# Patient Record
Sex: Female | Born: 1980 | Race: White | Hispanic: No | Marital: Married | Smoking: Former smoker
Health system: Southern US, Community
[De-identification: ages and names within clinical notes are randomized; demographics above are authoritative.]

## PROBLEM LIST (undated history)

## (undated) DIAGNOSIS — I480 Paroxysmal atrial fibrillation: Secondary | ICD-10-CM

## (undated) DIAGNOSIS — N12 Tubulo-interstitial nephritis, not specified as acute or chronic: Secondary | ICD-10-CM

## (undated) HISTORY — PX: EYE SURGERY: SHX253

## (undated) HISTORY — PX: CHOLECYSTECTOMY: SHX55

## (undated) HISTORY — PX: TUBAL LIGATION: SHX77

---

## 2011-03-14 ENCOUNTER — Encounter: Payer: Self-pay | Admitting: *Deleted

## 2011-03-14 ENCOUNTER — Emergency Department (HOSPITAL_COMMUNITY)
Admission: EM | Admit: 2011-03-14 | Discharge: 2011-03-14 | Disposition: A | Discharge status: Home / Self Care | Payer: Self-pay | Attending: Emergency Medicine | Admitting: Emergency Medicine

## 2011-03-14 DIAGNOSIS — Z79899 Other long term (current) drug therapy: Secondary | ICD-10-CM | POA: Insufficient documentation

## 2011-03-14 DIAGNOSIS — Z794 Long term (current) use of insulin: Secondary | ICD-10-CM | POA: Insufficient documentation

## 2011-03-14 DIAGNOSIS — R1013 Epigastric pain: Secondary | ICD-10-CM | POA: Insufficient documentation

## 2011-03-14 DIAGNOSIS — E119 Type 2 diabetes mellitus without complications: Secondary | ICD-10-CM | POA: Insufficient documentation

## 2011-03-14 DIAGNOSIS — R112 Nausea with vomiting, unspecified: Secondary | ICD-10-CM | POA: Insufficient documentation

## 2011-03-14 LAB — CBC
HCT: 43 % (ref 36.0–46.0)
MCV: 87.8 fL (ref 78.0–100.0)
RBC: 4.9 MIL/uL (ref 3.87–5.11)
WBC: 9.8 10*3/uL (ref 4.0–10.5)

## 2011-03-14 LAB — BASIC METABOLIC PANEL
BUN: 9 mg/dL (ref 6–23)
CO2: 23 mEq/L (ref 19–32)
Calcium: 9.1 mg/dL (ref 8.4–10.5)
Glucose, Bld: 217 mg/dL — ABNORMAL HIGH (ref 70–99)
Sodium: 136 mEq/L (ref 135–145)

## 2011-03-14 LAB — URINALYSIS, ROUTINE W REFLEX MICROSCOPIC
Bilirubin Urine: NEGATIVE
Glucose, UA: 500 mg/dL — AB
Hgb urine dipstick: NEGATIVE
Protein, ur: NEGATIVE mg/dL
Specific Gravity, Urine: 1.024 (ref 1.005–1.030)

## 2011-03-14 LAB — URINE MICROSCOPIC-ADD ON

## 2011-03-14 MED ORDER — OMEPRAZOLE 20 MG PO CPDR: 40.0000 mg | DELAYED_RELEASE_CAPSULE | Freq: Every day | ORAL | Status: DC

## 2011-03-14 NOTE — ED Provider Notes (Signed)
History     CSN: [id number]  Arrival date & time 03/14/11  1254   First MD Initiated Contact with Patient 03/14/11 1458      Chief Complaint  Patient presents with  . Abdominal Pain    (Consider location/radiation/quality/duration/timing/severity/associated sxs/prior treatment) Patient is a 30 y.o. female presenting with abdominal pain. The history is provided by the patient.  Abdominal Pain The primary symptoms of the illness include abdominal pain, nausea and vomiting. The primary symptoms of the illness do not include shortness of breath or dysuria.  Symptoms associated with the illness do not include chills, diaphoresis, hematuria or back pain.   the patient is a 30 year old, female, with a history of a cholecystectomy, who presents to the emergency department complaining of epigastric pain and nausea and vomiting.  She says when she has the pain.  It goes to her back.  She denies alcohol use.  She has not had peptic cultures in the past.  She denies cough, or shortness of breath.  She denies urinary tract symptoms.  She had one episode of diarrhea.  This morning, but the diarrhea is very unusual with her pain.  Past Medical History  Diagnosis Date  . Diabetes mellitus     Past Surgical History  Procedure Date  . Cholecystectomy     History reviewed. No pertinent family history.  History  Substance Use Topics  . Smoking status: Passive Smoker  . Smokeless tobacco: Not on file  . Alcohol Use: No    OB History    Grav Para Term Preterm Abortions TAB SAB Ect Mult Living                  Review of Systems  Constitutional: Negative for chills and diaphoresis.  HENT: Negative for congestion and neck pain.   Eyes: Negative for redness.  Respiratory: Negative for cough, chest tightness and shortness of breath.   Cardiovascular: Negative for chest pain.  Gastrointestinal: Positive for nausea, vomiting and abdominal pain.  Genitourinary: Negative for dysuria, hematuria  and flank pain.  Musculoskeletal: Negative for back pain.  Skin: Negative for rash.  Neurological: Negative for light-headedness, numbness and headaches.  Psychiatric/Behavioral: Negative for confusion.    Allergies  Review of patient's allergies indicates no known allergies.  Home Medications   Current Outpatient Rx  Name Route Sig Dispense Refill  . INSULIN ISOPHANE HUMAN 100 UNIT/ML [place] SUSP Subcutaneous Inject 20-23 Units into the skin 2 (two) times daily. Per patients sliding scale     . INSULIN REGULAR HUMAN 100 UNIT/ML IJ SOLN Subcutaneous Inject 20-23 Units into the skin 2 (two) times daily before a meal. Per patients sliding scale     . OMEPRAZOLE 20 MG PO CPDR Oral Take 2 capsules (40 mg total) by mouth daily. 30 capsule 6    BP 113/72  Pulse 95  Temp(Src) 98.2 F (36.8 C) (Oral)  Resp 20  SpO2 98%  Physical Exam  Constitutional: She is oriented to person, place, and time. She appears well-developed and well-nourished.  HENT:  Head: Normocephalic and atraumatic.  Eyes: Pupils are equal, round, and reactive to light.  Neck: Normal range of motion.  Cardiovascular: Normal rate, regular rhythm and normal heart sounds.   No murmur heard. Pulmonary/Chest: Effort normal and breath sounds normal. No respiratory distress. She has no wheezes. She has no rales.  Abdominal: Soft. She exhibits no distension and no mass. There is tenderness. There is no rebound and no guarding.  Mild epigastric tenderness, with no peritoneal signs  Musculoskeletal: Normal range of motion. She exhibits no edema and no tenderness.  Neurological: She is alert and oriented to person, place, and time. No cranial nerve deficit.  Skin: Skin is warm and dry. No rash noted. No erythema.  Psychiatric: She has a normal mood and affect. Her behavior is normal.    ED Course  Procedures (including critical care time) Abdominal pain in a 30 year old, female, with a history of cholecystectomy.  She does  not drink alcohol.  She has never had pancreatitis in the past.  She has never had ulcers in the past.  She does not want any pain medications at this time.  Labs Reviewed  BASIC METABOLIC PANEL - Abnormal; Notable for the following:    Potassium 3.4 (*)    Glucose, Bld 217 (*)    All other components within normal limits  URINALYSIS, ROUTINE W REFLEX MICROSCOPIC - Abnormal; Notable for the following:    APPearance CLOUDY (*)    Glucose, UA 500 (*)    Leukocytes, UA MODERATE (*)    All other components within normal limits  CBC - Abnormal; Notable for the following:    Hemoglobin 15.4 (*)    All other components within normal limits  LIPASE, BLOOD  URINE MICROSCOPIC-ADD ON   No results found.   1. Abdominal pain       MDM  Epigastric abdominal pain in a 30 year old, female, with a history of cholecystectomy.  No evidence of pancreatitis.  No acute abdomen.  I suspect, that she's got gastritis or peptic ulcers.  I will prescribe Prilosec and refer her to a gastroenterologist, for further evaluation.  She does not have an acute abdomen, and she does not want pain medications at this time.        Nicholes Stairs, MD 03/14/11 [phone]

## 2011-03-14 NOTE — ED Notes (Signed)
Pt reports several month history of mid upper abdominal pain. Pt reports pain is intermittent and comes in waves. Pt reports starting last night and n/v/d. Pt has had cholectomy.

## 2012-08-10 ENCOUNTER — Emergency Department (HOSPITAL_COMMUNITY)
Admission: EM | Admit: 2012-08-10 | Discharge: 2012-08-10 | Disposition: A | Discharge status: Home / Self Care | Payer: Self-pay | Attending: Emergency Medicine | Admitting: Emergency Medicine

## 2012-08-10 ENCOUNTER — Encounter (HOSPITAL_COMMUNITY): Payer: Self-pay | Admitting: Adult Health

## 2012-08-10 DIAGNOSIS — Z794 Long term (current) use of insulin: Secondary | ICD-10-CM | POA: Insufficient documentation

## 2012-08-10 DIAGNOSIS — E119 Type 2 diabetes mellitus without complications: Secondary | ICD-10-CM | POA: Insufficient documentation

## 2012-08-10 DIAGNOSIS — K0889 Other specified disorders of teeth and supporting structures: Secondary | ICD-10-CM

## 2012-08-10 DIAGNOSIS — K089 Disorder of teeth and supporting structures, unspecified: Secondary | ICD-10-CM | POA: Insufficient documentation

## 2012-08-10 MED ORDER — AMOXICILLIN 500 MG PO CAPS: 500.0000 mg | ORAL_CAPSULE | Freq: Three times a day (TID) | ORAL | Status: DC

## 2012-08-10 MED ORDER — HYDROCODONE-ACETAMINOPHEN 5-325 MG PO TABS: 1.0000 | ORAL_TABLET | ORAL | Status: DC | PRN

## 2012-08-10 MED ORDER — ONDANSETRON 4 MG PO TBDP: 4.0000 mg | ORAL_TABLET | Freq: Three times a day (TID) | ORAL | Status: DC | PRN

## 2012-08-10 NOTE — ED Provider Notes (Signed)
History     CSN: [id number]  Arrival date & time 08/10/12  1509   First MD Initiated Contact with Patient 08/10/12 1517      Chief Complaint  Patient presents with  . Dental Pain    (Consider location/radiation/quality/duration/timing/severity/associated sxs/prior treatment) HPI   Patient presents to the emergency department with a dental complaint. Symptoms on the right began a few days ago and the left began today. The patient has tried to alleviate pain with ibuprofen.  Pain rated at a 10/10, characterized as throbbing in nature and located  began right and upper molars. Patient denies fever, night sweats, chills, difficulty swallowing or opening mouth, SOB, nuchal rigidity or decreased ROM of neck. She has a dentist appointment scheduled for this up coming Monday  Patient does not have a dentist and requests a resource guide at discharge.  Past Medical History  Diagnosis Date  . Diabetes mellitus     Past Surgical History  Procedure Laterality Date  . Cholecystectomy      History reviewed. No pertinent family history.  History  Substance Use Topics  . Smoking status: Passive Smoke Exposure - Never Smoker  . Smokeless tobacco: Not on file  . Alcohol Use: No    OB History   Grav Para Term Preterm Abortions TAB SAB Ect Mult Living                  Review of Systems  HENT: Positive for dental problem.   All other systems reviewed and are negative.    Allergies  Review of patient's allergies indicates no known allergies.  Home Medications   Current Outpatient Rx  Name  Route  Sig  Dispense  Refill  . Cyanocobalamin (VITAMIN B-12 PO)   Oral   Take 1 tablet by mouth daily.         . insulin NPH (HUMULIN N,NOVOLIN N) 100 UNIT/ML injection   Subcutaneous   Inject 20-23 Units into the skin 2 (two) times daily. Per patients sliding scale          . insulin regular (NOVOLIN R,HUMULIN R) 100 units/mL injection   Subcutaneous   Inject 20-23 Units into the  skin 2 (two) times daily before a meal. Per patients sliding scale          . Multiple Vitamin (MULTIVITAMIN WITH MINERALS) TABS   Oral   Take 1 tablet by mouth daily.         Marland Kitchen amoxicillin (AMOXIL) 500 MG capsule   Oral   Take 1 capsule (500 mg total) by mouth 3 (three) times daily.   21 capsule   0   . HYDROcodone-acetaminophen (NORCO/VICODIN) 5-325 MG per tablet   Oral   Take 1 tablet by mouth every 4 (four) hours as needed for pain.   15 tablet   0   . ondansetron (ZOFRAN ODT) 4 MG disintegrating tablet   Oral   Take 1 tablet (4 mg total) by mouth every 8 (eight) hours as needed for nausea.   20 tablet   0     BP 124/82  Pulse 98  Temp(Src) 98.6 F (37 C) (Oral)  Resp 20  SpO2 100%  Physical Exam  Constitutional: She appears well-developed and well-nourished. No distress.  HENT:  Head: Normocephalic and atraumatic.  Mouth/Throat: Uvula is midline, oropharynx is clear and moist and mucous membranes are normal. Normal dentition. Dental caries (Pts tooth shows no obvious abscess but moderate to severe tenderness to palpation of marked tooth)  present. No edematous.    Eyes: Pupils are equal, round, and reactive to light.  Neck: Trachea normal, normal range of motion and full passive range of motion without pain. Neck supple.  Cardiovascular: Normal rate, regular rhythm, normal heart sounds and normal pulses.   Pulmonary/Chest: Effort normal and breath sounds normal. No respiratory distress. Chest wall is not dull to percussion. She exhibits no tenderness, no crepitus, no edema, no deformity and no retraction.  Abdominal: Normal appearance.  Musculoskeletal: Normal range of motion.  Neurological: She is alert. She has normal strength.  Skin: Skin is warm, dry and intact. She is not diaphoretic.  Psychiatric: She has a normal mood and affect. Her speech is normal. Cognition and memory are normal.    ED Course  Procedures (including critical care time)  Labs  Reviewed - No data to display No results found.   1. Toothache       MDM  Patient has dental pain. No emergent s/sx's present. Patent airway. No trismus.  Will be given pain medication and antibiotics. I discussed the need to call dentist within 24/48 hours for follow-up. Dental referral given. Return to ED precautions given.  Pt voiced understanding and has agreed to follow-up.          Dorthula Matas, PA-C 08/10/12 1533

## 2012-08-10 NOTE — ED Notes (Signed)
Presents with left lower broken tooth and pain that began this am, c/o pain. Has a dentist appointment this week. Pain is unbearable.

## 2012-08-10 NOTE — ED Provider Notes (Signed)
Medical screening examination/treatment/procedure(s) were performed by non-physician practitioner and as supervising physician I was immediately available for consultation/collaboration.   Gwyneth Sprout, MD 08/10/12 1544

## 2013-01-16 ENCOUNTER — Encounter (HOSPITAL_COMMUNITY): Payer: Self-pay | Admitting: Emergency Medicine

## 2013-01-16 ENCOUNTER — Emergency Department (INDEPENDENT_AMBULATORY_CARE_PROVIDER_SITE_OTHER)
Admission: EM | Admit: 2013-01-16 | Discharge: 2013-01-16 | Disposition: A | Discharge status: Home / Self Care | Payer: Self-pay | Source: Home / Self Care | Attending: Emergency Medicine | Admitting: Emergency Medicine

## 2013-01-16 DIAGNOSIS — K0401 Reversible pulpitis: Secondary | ICD-10-CM

## 2013-01-16 MED ORDER — IBUPROFEN 800 MG PO TABS
ORAL_TABLET | ORAL | Status: AC
  Filled 2013-01-16: qty 1

## 2013-01-16 MED ORDER — MELOXICAM 15 MG PO TABS: 15.0000 mg | ORAL_TABLET | Freq: Every day | ORAL | Status: DC

## 2013-01-16 MED ORDER — OXYCODONE-ACETAMINOPHEN 5-325 MG PO TABS: ORAL_TABLET | ORAL | Status: DC

## 2013-01-16 MED ORDER — IBUPROFEN 800 MG PO TABS
800.0000 mg | ORAL_TABLET | Freq: Once | ORAL | Status: AC
  Administered 2013-01-16: 800 mg via ORAL

## 2013-01-16 MED ORDER — AMOXICILLIN 500 MG PO CAPS: 500.0000 mg | ORAL_CAPSULE | Freq: Three times a day (TID) | ORAL | Status: DC

## 2013-01-16 NOTE — ED Provider Notes (Signed)
Chief Complaint:   Chief Complaint  Patient presents with  . Dental Pain    History of Present Illness:   Katie Hampton is a 32 year old female who has had a one-week history of pain involving the left, lower first molar. She cracked this up she wants to go, it didn't bother her until about a week. Is been swelling the gingiva and purulent drainage. She denies any difficulty opening her mouth, swelling of the tongue, difficulty breathing or swallowing. Has been no fever or, she has had some headache. She denies any swelling of the neck or adenopathy. No chest pain or shortness of breath.  Review of Systems:  Other than noted above, the patient denies any of the following symptoms: Systemic:  No fever, chills,  Or sweats. ENT:  No headache, ear ache, sore throat, nasal congestion, facial pain, or swelling. Lymphatic:  No adenopathy. Lungs:  No coughing, wheezing or shortness of breath.  PMFSH:  Past medical history, family history, social history, meds, and allergies were reviewed. She has insulin requiring diabetes.  Physical Exam:   Vital signs:  BP 146/83  Pulse 92  Temp(Src) 98.8 F (37.1 C) (Oral)  Resp 16  SpO2 99%  LMP 12/26/2012 General:  Alert, oriented, in no distress. ENT:  TMs and canals normal.  Nasal mucosa normal. Mouth exam:  The right, lower first molar was cracked and dictated. There is no swelling of the gingiva or collection of pus. She is able to fully open her mouth. The pharynx is clear, no swelling of the tongue the floor the mouth. Otherwise teeth are in good repair. Neck:  No swelling or adenopathy. Lungs:  Breath sounds clear and equal bilaterally.  No wheezes, rales or rhonchi. Heart:  Regular rhythm.  No gallops or murmers. Skin:  Clear, warm and dry.   Course in Urgent Care Center:   Benzocaine gel was applied. Patient declines a dental block.  Assessment:  The encounter diagnosis was Pulpitis.  Plan:   1.  Meds:  The following meds were  prescribed:   Discharge Medication List as of 01/16/2013  5:27 PM    START taking these medications   Details  !! amoxicillin (AMOXIL) 500 MG capsule Take 1 capsule (500 mg total) by mouth 3 (three) times daily., Starting 01/16/2013, Until Discontinued, Normal    meloxicam (MOBIC) 15 MG tablet Take 1 tablet (15 mg total) by mouth daily., Starting 01/16/2013, Until Discontinued, Normal    oxyCODONE-acetaminophen (PERCOCET) 5-325 MG per tablet 1 to 2 tablets every 6 hours as needed for pain., Print     !! - Potential duplicate medications found. Please discuss with provider.      2.  Patient Education/Counseling:  The patient was given appropriate handouts, self care instructions, and instructed in symptomatic relief. Suggested sleeping with head of bed elevated and hot salt water mouthwash.   3.  Follow up:  The patient was told to follow up if no better in 3 to 4 days, if becoming worse in any way, and given some red flag symptoms such as difficulty swallowing or breathing which would prompt immediate return.  Follow up with a dentist as soon as posssible.     Reuben Likes, MD 01/16/13 [phone]

## 2013-01-16 NOTE — ED Notes (Signed)
Pt  Reports  Symptoms  Of        Toothache  She  Reports       Poss  Broken  Tooth              Symptoms  X  1  Week  Ago              Symptoms  Not  releived  By Armond Hang    /  Tylenol

## 2013-12-11 ENCOUNTER — Encounter (HOSPITAL_COMMUNITY): Payer: Self-pay | Admitting: Emergency Medicine

## 2013-12-11 ENCOUNTER — Emergency Department (HOSPITAL_COMMUNITY)
Admission: EM | Admit: 2013-12-11 | Discharge: 2013-12-11 | Disposition: A | Discharge status: Home / Self Care | Payer: No Typology Code available for payment source | Attending: Emergency Medicine | Admitting: Emergency Medicine

## 2013-12-11 DIAGNOSIS — R519 Headache, unspecified: Secondary | ICD-10-CM

## 2013-12-11 DIAGNOSIS — E119 Type 2 diabetes mellitus without complications: Secondary | ICD-10-CM | POA: Diagnosis not present

## 2013-12-11 DIAGNOSIS — Z9889 Other specified postprocedural states: Secondary | ICD-10-CM | POA: Diagnosis not present

## 2013-12-11 DIAGNOSIS — Z792 Long term (current) use of antibiotics: Secondary | ICD-10-CM | POA: Insufficient documentation

## 2013-12-11 DIAGNOSIS — Z79899 Other long term (current) drug therapy: Secondary | ICD-10-CM | POA: Diagnosis not present

## 2013-12-11 DIAGNOSIS — R51 Headache: Secondary | ICD-10-CM | POA: Insufficient documentation

## 2013-12-11 DIAGNOSIS — Z794 Long term (current) use of insulin: Secondary | ICD-10-CM | POA: Diagnosis not present

## 2013-12-11 DIAGNOSIS — Z791 Long term (current) use of non-steroidal anti-inflammatories (NSAID): Secondary | ICD-10-CM | POA: Diagnosis not present

## 2013-12-11 MED ORDER — OXYCODONE-ACETAMINOPHEN 5-325 MG PO TABS
2.0000 | ORAL_TABLET | Freq: Once | ORAL | Status: AC
  Administered 2013-12-11: 2 via ORAL
  Filled 2013-12-11: qty 2

## 2013-12-11 MED ORDER — TETRACAINE HCL 0.5 % OP SOLN
1.0000 [drp] | Freq: Once | OPHTHALMIC | Status: AC
  Administered 2013-12-11: 2 [drp] via OPHTHALMIC
  Filled 2013-12-11: qty 2

## 2013-12-11 MED ORDER — ONDANSETRON 4 MG PO TBDP
8.0000 mg | ORAL_TABLET | Freq: Once | ORAL | Status: AC
  Administered 2013-12-11: 8 mg via ORAL
  Filled 2013-12-11: qty 2

## 2013-12-11 NOTE — ED Notes (Signed)
Pt. Stated, i've had a headache for 3 days.

## 2013-12-11 NOTE — ED Provider Notes (Signed)
CSN: [id number]     Arrival date & time 12/11/13  1132 History  This chart was scribed for non-physician practitioner working with Rolland Porter, MD by Elveria Rising, ED Scribe. This patient was seen in room TR06C/TR06C and the patient's care was started at 12:53 PM.   Chief Complaint  Patient presents with  . Headache   The history is provided by the patient. No language interpreter was used.   HPI Comments: Katie Hampton is a 33 y.o. female who presents to the Emergency Department complaining of intermittent, headache ongoing for three days. It has been gradually worsening. 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 Allyne Gee: procedure described as "gas bubble". Patient reports nausea and episode of vomiting days following her surgery which seemed to cause straining pressure in her left eye, severe left sided headache, and occasional sensation of foreign body in the left eye at the incision site.   Patient reports calling her surgeon the following day for episode of vomiting. She was advised to treat with cool compresses. Patient reports attempted treatment with Aleve and cool compresses, but denies relief. Patient has been using her prescribed drops as directed.  Patient denies new visual changes or disturbance, discharge, or increased redness.    Past Medical History  Diagnosis Date  . Diabetes mellitus    Past Surgical History  Procedure Laterality Date  . Cholecystectomy     No family history on file. History  Substance Use Topics  . Smoking status: Passive Smoke Exposure - Never Smoker  . Smokeless tobacco: Not on file  . Alcohol Use: No   OB History   Grav Para Term Preterm Abortions TAB SAB Ect Mult Living                 Review of Systems  Constitutional: Negative for fever and chills.  Eyes: Positive for pain. Negative for discharge, redness and visual disturbance.  Neurological: Positive for headaches.  All other systems  reviewed and are negative.  Allergies  Review of patient's allergies indicates no known allergies.  Home Medications   Prior to Admission medications   Medication Sig Start Date End Date Taking? Authorizing Provider  amoxicillin (AMOXIL) 500 MG capsule Take 1 capsule (500 mg total) by mouth 3 (three) times daily. 08/10/12   Tiffany Irine Seal, PA-C  amoxicillin (AMOXIL) 500 MG capsule Take 1 capsule (500 mg total) by mouth 3 (three) times daily. 01/16/13   Reuben Likes, MD  Cyanocobalamin (VITAMIN B-12 PO) Take 1 tablet by mouth daily.    Historical Provider, MD  HYDROcodone-acetaminophen (NORCO/VICODIN) 5-325 MG per tablet Take 1 tablet by mouth every 4 (four) hours as needed for pain. 08/10/12   Tiffany Irine Seal, PA-C  insulin NPH (HUMULIN N,NOVOLIN N) 100 UNIT/ML injection Inject 20-23 Units into the skin 2 (two) times daily. Per patients sliding scale     Historical Provider, MD  insulin regular (NOVOLIN R,HUMULIN R) 100 units/mL injection Inject 20-23 Units into the skin 2 (two) times daily before a meal. Per patients sliding scale     Historical Provider, MD  meloxicam (MOBIC) 15 MG tablet Take 1 tablet (15 mg total) by mouth daily. 01/16/13   Reuben Likes, MD  Multiple Vitamin (MULTIVITAMIN WITH MINERALS) TABS Take 1 tablet by mouth daily.    Historical Provider, MD  ondansetron (ZOFRAN ODT) 4 MG disintegrating tablet Take 1 tablet (4 mg total) by mouth every 8 (eight) hours as needed for nausea.  08/10/12   Tiffany Irine Seal, PA-C  oxyCODONE-acetaminophen (PERCOCET) 5-325 MG per tablet 1 to 2 tablets every 6 hours as needed for pain. 01/16/13   Reuben Likes, MD   Triage Vitals: BP 145/74  Pulse 102  Temp(Src) 98.4 F (36.9 C) (Oral)  Resp 18  Ht  (1.6 m)  Wt 214 lb (97.07 kg)  BMI 37.92 kg/m2  SpO2 98%  LMP 11/17/2013  Physical Exam  Nursing note and vitals reviewed. Constitutional: She is oriented to person, place, and time. She appears well-developed and well-nourished. No  distress.  HENT:  Head: Normocephalic and atraumatic.  Right Ear: External ear normal.  Left Ear: External ear normal.  Nose: Nose normal.  Mouth/Throat: Oropharynx is clear and moist.  Eyes: EOM and lids are normal. Right eye exhibits no discharge. Left eye exhibits no discharge. Right conjunctiva is not injected. Right conjunctiva has no hemorrhage. Left conjunctiva is not injected. Left conjunctiva has no hemorrhage. Right pupil is round and reactive. Left pupil is not reactive (unchanged post op). Left pupil is round. Pupils are unequal.  Fundoscopic exam:      The right eye shows no papilledema. The right eye shows no red reflex.  Anterior chamber of left eye clear. Funduscopic examination reveals cloudy intraocular findings.   Neck: Normal range of motion. Neck supple.  Cardiovascular: Normal rate, regular rhythm and normal heart sounds.   Pulmonary/Chest: Effort normal and breath sounds normal.  Abdominal: Soft.  Musculoskeletal: Normal range of motion.  Neurological: She is alert and oriented to person, place, and time.  Skin: Skin is warm and dry. She is not diaphoretic.  Psychiatric: She has a normal mood and affect.    ED Course  Procedures (including critical care time) Medications  tetracaine (PONTOCAINE) 0.5 % ophthalmic solution 1-2 drop (2 drops Left Eye Given 12/11/13 1350)  oxyCODONE-acetaminophen (PERCOCET/ROXICET) 5-325 MG per tablet 2 tablet (2 tablets Oral Given 12/11/13 1348)  ondansetron (ZOFRAN-ODT) disintegrating tablet 8 mg (8 mg Oral Given 12/11/13 1349)    COORDINATION OF CARE: 12:51 PM- Discussed treatment plan with patient at bedside and patient agreed to plan.   Labs Review Labs Reviewed - No data to display  Imaging Review No results found.   EKG Interpretation None      Discussed patient with Dr. Allyne Gee, her surgeon, who recommends checking eye pressure. He states the eye pressure should be less than . He also states the patient was  supposed to follow up in his office on Friday, but never showed up.   Called Dr. Allyne Gee back after tonopen examination. Told him two pressures were obtained at 19 and . He states discharge the patient home with follow up in his office Monday, he thinks the headache is unlikely a serious complication from the eye surgery.  MDM   Final diagnoses:  None    Filed Vitals:   12/11/13 1436  BP: 134/78  Pulse: 76  Temp: 98.4 F (36.9 C)  Resp: 16   Afebrile, NAD, non-toxic appearing, AAOx4. Headache likely related to post op changes, due to increased sensitivity per Dr. Allyne Gee. He does not believe that this is an acute ophthalmologic emergency. He states symptomatic treatment with follow up in his office on Monday. He also suggested increasing Pred Forte drops to 6 times daily. Also recommended that patient does not use antibiotic drops with Pred forte drops simultaneously. Return precautions were discussed. Patient is agreeable to plan. Patient is able to discharge   Patient d/w with  Dr. Fayrene Fearing, agrees with plan.    I personally performed the services described in this documentation, which was scribed in my presence. The recorded information has been reviewed and is accurate.    Jeannetta Ellis, PA-C 12/11/13 1545

## 2013-12-11 NOTE — ED Notes (Signed)
Pt. Stated, I had eye surgery on the Sept. 17.  And now I've had a headache for 3 days.

## 2013-12-11 NOTE — Discharge Instructions (Signed)
Please follow up with Dr. Allyne Gee on Monday to be seen. Please increase the use of your Pred Forte to six times daily. Please space the use of your Pred Forte drops and antibiotic drops. Please place your rewetting drops in the fridge to help provide more relief. Please use cool compresses to the eye to help with symptoms. Please read all discharge instructions and return precautions.   General Headache Without Cause A headache is pain or discomfort felt around the head or neck area. The specific cause of a headache may not be found. There are many causes and types of headaches. A few common ones are:  Tension headaches.  Migraine headaches.  Cluster headaches.  Chronic daily headaches. HOME CARE INSTRUCTIONS   Keep all follow-up appointments with your caregiver or any specialist referral.  Only take over-the-counter or prescription medicines for pain or discomfort as directed by your caregiver.  Lie down in a dark, quiet room when you have a headache.  Keep a headache journal to find out what may trigger your migraine headaches. For example, write down:  What you eat and drink.  How much sleep you get.  Any change to your diet or medicines.  Try massage or other relaxation techniques.  Put ice packs or heat on the head and neck. Use these 3 to 4 times per day for 15 to 20 minutes each time, or as needed.  Limit stress.  Sit up straight, and do not tense your muscles.  Quit smoking if you smoke.  Limit alcohol use.  Decrease the amount of caffeine you drink, or stop drinking caffeine.  Eat and sleep on a regular schedule.  Get 7 to 9 hours of sleep, or as recommended by your caregiver.  Keep lights dim if bright lights bother you and make your headaches worse. SEEK MEDICAL CARE IF:   You have problems with the medicines you were prescribed.  Your medicines are not working.  You have a change from the usual headache.  You have nausea or vomiting. SEEK IMMEDIATE  MEDICAL CARE IF:   Your headache becomes severe.  You have a fever.  You have a stiff neck.  You have loss of vision.  You have muscular weakness or loss of muscle control.  You start losing your balance or have trouble walking.  You feel faint or pass out.  You have severe symptoms that are different from your first symptoms. MAKE SURE YOU:   Understand these instructions.  Will watch your condition.  Will get help right away if you are not doing well or get worse. Document Released: 03/04/2005 Document Revised: 05/27/2011 Document Reviewed: 03/20/2011 [facility] Patient Information 2015 [place], [place]. This information is not intended to replace advice given to you by your health care provider. Make sure you discuss any questions you have with your health care provider.

## 2013-12-12 NOTE — ED Provider Notes (Signed)
Medical screening examination/treatment/procedure(s) were performed by non-physician practitioner and as supervising physician I was immediately available for consultation/collaboration.   EKG Interpretation None        Rolland Porter, MD 12/12/13 1540

## 2014-09-02 ENCOUNTER — Emergency Department (HOSPITAL_COMMUNITY)
Admission: EM | Admit: 2014-09-02 | Discharge: 2014-09-02 | Disposition: A | Discharge status: Home / Self Care | Payer: No Typology Code available for payment source | Source: Home / Self Care | Attending: Family Medicine | Admitting: Family Medicine

## 2014-09-02 ENCOUNTER — Encounter (HOSPITAL_COMMUNITY): Payer: Self-pay | Admitting: *Deleted

## 2014-09-02 DIAGNOSIS — N390 Urinary tract infection, site not specified: Secondary | ICD-10-CM

## 2014-09-02 LAB — POCT URINALYSIS DIP (DEVICE)
BILIRUBIN URINE: NEGATIVE
GLUCOSE, UA: 100 mg/dL — AB
Hgb urine dipstick: NEGATIVE
Ketones, ur: NEGATIVE mg/dL
LEUKOCYTES UA: NEGATIVE
NITRITE: POSITIVE — AB
Protein, ur: NEGATIVE mg/dL
Specific Gravity, Urine: 1.01 (ref 1.005–1.030)
Urobilinogen, UA: 1 mg/dL (ref 0.0–1.0)
pH: 5.5 (ref 5.0–8.0)

## 2014-09-02 LAB — POCT PREGNANCY, URINE: PREG TEST UR: NEGATIVE

## 2014-09-02 MED ORDER — FLUCONAZOLE 150 MG PO TABS: 150.0000 mg | ORAL_TABLET | Freq: Once | ORAL | Status: DC

## 2014-09-02 MED ORDER — TERCONAZOLE 80 MG VA SUPP: 80.0000 mg | Freq: Every day | VAGINAL | Status: DC

## 2014-09-02 MED ORDER — CEPHALEXIN 500 MG PO CAPS: 500.0000 mg | ORAL_CAPSULE | Freq: Four times a day (QID) | ORAL | Status: DC

## 2014-09-02 NOTE — ED Notes (Signed)
Pt  Has  Had  uti  In  Past      -   She  Reports  Symptoms  Of    Burning   And  Frequency  On  Urination          She  Is  A  Diabetic

## 2014-09-02 NOTE — ED Provider Notes (Signed)
CSN: [id number]     Arrival date & time 09/02/14  1437 History   First MD Initiated Contact with Patient 09/02/14 1609     Chief Complaint  Patient presents with  . Urinary Tract Infection   (Consider location/radiation/quality/duration/timing/severity/associated sxs/prior Treatment) Patient is a 34 y.o. female presenting with urinary tract infection. The history is provided by the patient.  Urinary Tract Infection Pain quality:  Burning Pain severity:  Mild Onset quality:  Gradual Duration:  3 days Progression:  Unchanged Chronicity:  New Recent urinary tract infections: no   Urinary symptoms: frequent urination   Urinary symptoms: no foul-smelling urine   Associated symptoms: abdominal pain   Associated symptoms: no fever, no flank pain, no nausea, no vaginal discharge and no vomiting   Risk factors: not pregnant and no recurrent urinary tract infections     Past Medical History  Diagnosis Date  . Diabetes mellitus    Past Surgical History  Procedure Laterality Date  . Cholecystectomy     History reviewed. No pertinent family history. History  Substance Use Topics  . Smoking status: Passive Smoke Exposure - Never Smoker  . Smokeless tobacco: Not on file  . Alcohol Use: No   OB History    No data available     Review of Systems  Constitutional: Negative.  Negative for fever.  Gastrointestinal: Positive for abdominal pain. Negative for nausea and vomiting.  Genitourinary: Positive for dysuria and frequency. Negative for hematuria, flank pain, vaginal discharge, difficulty urinating, menstrual problem and pelvic pain.    Allergies  Review of patient's allergies indicates no known allergies.  Home Medications   Prior to Admission medications   Medication Sig Start Date End Date Taking? Authorizing Provider  amoxicillin (AMOXIL) 500 MG capsule Take 1 capsule (500 mg total) by mouth 3 (three) times daily. 08/10/12   Tiffany Neva Seat, PA-C  amoxicillin (AMOXIL) 500 MG  capsule Take 1 capsule (500 mg total) by mouth 3 (three) times daily. 01/16/13   Reuben Likes, MD  cephALEXin (KEFLEX) 500 MG capsule Take 1 capsule (500 mg total) by mouth 4 (four) times daily. Take all of medicine and drink lots of fluids 09/02/14   Linna Hoff, MD  Cyanocobalamin (VITAMIN B-12 PO) Take 1 tablet by mouth daily.    Historical Provider, MD  fluconazole (DIFLUCAN) 150 MG tablet Take 1 tablet (150 mg total) by mouth once. 09/02/14   Linna Hoff, MD  HYDROcodone-acetaminophen (NORCO/VICODIN) 5-325 MG per tablet Take 1 tablet by mouth every 4 (four) hours as needed for pain. 08/10/12   Tiffany Neva Seat, PA-C  insulin NPH (HUMULIN N,NOVOLIN N) 100 UNIT/ML injection Inject 20-23 Units into the skin 2 (two) times daily. Per patients sliding scale     Historical Provider, MD  insulin regular (NOVOLIN R,HUMULIN R) 100 units/mL injection Inject 20-23 Units into the skin 2 (two) times daily before a meal. Per patients sliding scale     Historical Provider, MD  meloxicam (MOBIC) 15 MG tablet Take 1 tablet (15 mg total) by mouth daily. 01/16/13   Reuben Likes, MD  Multiple Vitamin (MULTIVITAMIN WITH MINERALS) TABS Take 1 tablet by mouth daily.    Historical Provider, MD  ondansetron (ZOFRAN ODT) 4 MG disintegrating tablet Take 1 tablet (4 mg total) by mouth every 8 (eight) hours as needed for nausea. 08/10/12   Marlon Pel, PA-C  oxyCODONE-acetaminophen (PERCOCET) 5-325 MG per tablet 1 to 2 tablets every 6 hours as needed for pain. 01/16/13   Onalee Hua  Vivia Budge, MD  terconazole (TERAZOL 3) 80 MG vaginal suppository Place 1 suppository (80 mg total) vaginally at bedtime. 09/02/14   Linna Hoff, MD   BP 124/78 mmHg  Pulse 78  Temp(Src) 98.6 F (37 C) (Oral)  Resp 18  SpO2 100%  LMP 08/26/2014 Physical Exam  Constitutional: She is oriented to person, place, and time. She appears well-developed and well-nourished. No distress.  Abdominal: Soft. Bowel sounds are normal. She exhibits no mass.  There is no tenderness. There is no rebound and no guarding.  Neurological: She is alert and oriented to person, place, and time.  Skin: Skin is warm and dry.  Nursing note and vitals reviewed.   ED Course  Procedures (including critical care time) Labs Review Labs Reviewed  POCT URINALYSIS DIP (DEVICE) - Abnormal; Notable for the following:    Glucose, UA 100 (*)    Nitrite POSITIVE (*)    All other components within normal limits  POCT PREGNANCY, URINE    Imaging Review No results found.   MDM   1. UTI (lower urinary tract infection)        Linna Hoff, MD 09/02/14 [id number]

## 2014-09-02 NOTE — Discharge Instructions (Signed)
Take all of medicine as directed, drink lots of fluids, see your doctor if further problems. °

## 2015-07-30 ENCOUNTER — Ambulatory Visit (HOSPITAL_COMMUNITY)
Admission: EM | Admit: 2015-07-30 | Discharge: 2015-07-30 | Disposition: A | Discharge status: Home / Self Care | Payer: Medicaid Other | Attending: Emergency Medicine | Admitting: Emergency Medicine

## 2015-07-30 ENCOUNTER — Encounter (HOSPITAL_COMMUNITY): Payer: Self-pay | Admitting: Emergency Medicine

## 2015-07-30 ENCOUNTER — Emergency Department (HOSPITAL_COMMUNITY): Payer: Medicaid Other

## 2015-07-30 ENCOUNTER — Encounter (HOSPITAL_COMMUNITY): Payer: Self-pay | Admitting: *Deleted

## 2015-07-30 ENCOUNTER — Emergency Department (HOSPITAL_COMMUNITY)
Admission: EM | Admit: 2015-07-30 | Discharge: 2015-07-30 | Disposition: A | Discharge status: Home / Self Care | Payer: Medicaid Other | Attending: Emergency Medicine | Admitting: Emergency Medicine

## 2015-07-30 DIAGNOSIS — R300 Dysuria: Secondary | ICD-10-CM | POA: Insufficient documentation

## 2015-07-30 DIAGNOSIS — Z79899 Other long term (current) drug therapy: Secondary | ICD-10-CM | POA: Diagnosis not present

## 2015-07-30 DIAGNOSIS — Z791 Long term (current) use of non-steroidal anti-inflammatories (NSAID): Secondary | ICD-10-CM | POA: Insufficient documentation

## 2015-07-30 DIAGNOSIS — R197 Diarrhea, unspecified: Secondary | ICD-10-CM | POA: Diagnosis not present

## 2015-07-30 DIAGNOSIS — R112 Nausea with vomiting, unspecified: Secondary | ICD-10-CM | POA: Insufficient documentation

## 2015-07-30 DIAGNOSIS — M545 Low back pain: Secondary | ICD-10-CM | POA: Diagnosis not present

## 2015-07-30 DIAGNOSIS — E119 Type 2 diabetes mellitus without complications: Secondary | ICD-10-CM | POA: Insufficient documentation

## 2015-07-30 DIAGNOSIS — R109 Unspecified abdominal pain: Secondary | ICD-10-CM

## 2015-07-30 DIAGNOSIS — R1032 Left lower quadrant pain: Secondary | ICD-10-CM | POA: Diagnosis not present

## 2015-07-30 DIAGNOSIS — Z792 Long term (current) use of antibiotics: Secondary | ICD-10-CM | POA: Diagnosis not present

## 2015-07-30 DIAGNOSIS — Z3202 Encounter for pregnancy test, result negative: Secondary | ICD-10-CM | POA: Insufficient documentation

## 2015-07-30 DIAGNOSIS — F17210 Nicotine dependence, cigarettes, uncomplicated: Secondary | ICD-10-CM | POA: Diagnosis not present

## 2015-07-30 DIAGNOSIS — Z794 Long term (current) use of insulin: Secondary | ICD-10-CM | POA: Insufficient documentation

## 2015-07-30 LAB — COMPREHENSIVE METABOLIC PANEL
ALK PHOS: 86 U/L (ref 38–126)
ALT: 17 U/L (ref 14–54)
AST: 41 U/L (ref 15–41)
Albumin: 3.5 g/dL (ref 3.5–5.0)
Anion gap: 11 (ref 5–15)
BUN: 11 mg/dL (ref 6–20)
CALCIUM: 9 mg/dL (ref 8.9–10.3)
CO2: 24 mmol/L (ref 22–32)
CREATININE: 0.81 mg/dL (ref 0.44–1.00)
Chloride: 101 mmol/L (ref 101–111)
GFR calc non Af Amer: >60 mL/min (ref >60)
Glucose, Bld: 242 mg/dL — ABNORMAL HIGH (ref 65–99)
Potassium: 5.3 mmol/L — ABNORMAL HIGH (ref 3.5–5.1)
SODIUM: 136 mmol/L (ref 135–145)
Total Bilirubin: 0.8 mg/dL (ref 0.3–1.2)
Total Protein: 6.9 g/dL (ref 6.5–8.1)

## 2015-07-30 LAB — POCT URINALYSIS DIP (DEVICE)
BILIRUBIN URINE: NEGATIVE
GLUCOSE, UA: NEGATIVE mg/dL
Ketones, ur: NEGATIVE mg/dL
LEUKOCYTES UA: NEGATIVE
Nitrite: NEGATIVE
Protein, ur: 30 mg/dL — AB
SPECIFIC GRAVITY, URINE: 1.015 (ref 1.005–1.030)
UROBILINOGEN UA: 0.2 mg/dL (ref 0.0–1.0)
pH: 6 (ref 5.0–8.0)

## 2015-07-30 LAB — LIPASE, BLOOD: Lipase: 17 U/L (ref 11–51)

## 2015-07-30 LAB — CBC WITH DIFFERENTIAL/PLATELET
Basophils Absolute: 0 10*3/uL (ref 0.0–0.1)
Basophils Relative: 0 %
Eosinophils Absolute: 0.3 10*3/uL (ref 0.0–0.7)
Eosinophils Relative: 2 %
HCT: 39.9 % (ref 36.0–46.0)
HEMOGLOBIN: 13.5 g/dL (ref 12.0–15.0)
LYMPHS ABS: 2.6 10*3/uL (ref 0.7–4.0)
LYMPHS PCT: 22 %
MCH: 30.3 pg (ref 26.0–34.0)
MCHC: 33.8 g/dL (ref 30.0–36.0)
MCV: 89.5 fL (ref 78.0–100.0)
Monocytes Absolute: 0.7 10*3/uL (ref 0.1–1.0)
Monocytes Relative: 6 %
NEUTROS ABS: 8.2 10*3/uL — AB (ref 1.7–7.7)
NEUTROS PCT: 70 %
Platelets: 262 10*3/uL (ref 150–400)
RBC: 4.46 MIL/uL (ref 3.87–5.11)
RDW: 13.2 % (ref 11.5–15.5)
WBC: 11.9 10*3/uL — AB (ref 4.0–10.5)

## 2015-07-30 LAB — POCT I-STAT, CHEM 8
BUN: 12 mg/dL (ref 6–20)
CHLORIDE: 102 mmol/L (ref 101–111)
Calcium, Ion: 1.23 mmol/L (ref 1.12–1.23)
Creatinine, Ser: 0.7 mg/dL (ref 0.44–1.00)
Glucose, Bld: 90 mg/dL (ref 65–99)
HCT: 47 % — ABNORMAL HIGH (ref 36.0–46.0)
Hemoglobin: 16 g/dL — ABNORMAL HIGH (ref 12.0–15.0)
POTASSIUM: 4.1 mmol/L (ref 3.5–5.1)
SODIUM: 142 mmol/L (ref 135–145)
TCO2: 29 mmol/L (ref 0–100)

## 2015-07-30 LAB — I-STAT BETA HCG BLOOD, ED (MC, WL, AP ONLY)

## 2015-07-30 MED ORDER — HYDROCODONE-ACETAMINOPHEN 5-325 MG PO TABS: 1.0000 | ORAL_TABLET | Freq: Four times a day (QID) | ORAL | Status: DC | PRN

## 2015-07-30 MED ORDER — HYDROMORPHONE HCL 1 MG/ML IJ SOLN
0.5000 mg | Freq: Once | INTRAMUSCULAR | Status: AC
  Administered 2015-07-30: 0.5 mg via INTRAVENOUS
  Filled 2015-07-30: qty 1

## 2015-07-30 MED ORDER — ONDANSETRON HCL 4 MG/2ML IJ SOLN
4.0000 mg | Freq: Once | INTRAMUSCULAR | Status: AC
  Administered 2015-07-30: 4 mg via INTRAVENOUS
  Filled 2015-07-30: qty 2

## 2015-07-30 MED ORDER — ONDANSETRON HCL 4 MG PO TABS: 4.0000 mg | ORAL_TABLET | Freq: Four times a day (QID) | ORAL | Status: DC

## 2015-07-30 NOTE — ED Notes (Signed)
Left flank pain for 10 days.  Seen at family doctor and placed on Cipro due to elevated WBC's.  Having pain with nausea since then.  [facility]  Blood in urine there.  No s/s of infection.  Reports bladder pressure that comes and goes with flank pain especially after urination.  No hx of kidney stones.

## 2015-07-30 NOTE — Discharge Instructions (Signed)
As discussed, your evaluation today has been largely reassuring.  But, it is important that you monitor your condition carefully, and do not hesitate to return to the ED if you develop new, or concerning changes in your condition. ? ?Otherwise, please follow-up with your physician for appropriate ongoing care. ? ?

## 2015-07-30 NOTE — ED Notes (Signed)
Patient reports week history of lower back pain radiating into left flank with generalized abdominal pain. Patient reports she was seen at PCP for same and was given rx for cipro, states they noted increased WBC but urine was clean. Finished rx on Tuesday of cipro, reports no relief. Reports mild dysuria. States constant nausea but no vomiting, has rx for zofran and phenergan which was given pt relief from vomiting. Low grade fever. Reports generalized malaise.

## 2015-07-30 NOTE — ED Provider Notes (Signed)
CSN: [id number]     Arrival date & time 07/30/15  1957 History   First MD Initiated Contact with Patient 07/30/15 2038     Chief Complaint  Patient presents with  . Flank Pain     (Consider location/radiation/quality/duration/timing/severity/associated sxs/prior Treatment) HPI Katie Hampton is a 35 y.o. female with history of diabetes, presents to emergency department complaining of left flank pain and left abdominal pain. Patient states her pain started a week and half ago. States she went to see her primary care doctor who checked urinalysis and told her it was normal, but told her that her white blood cell count was high so he placed her on Cipro. She states that Cipro has not helped. She has called her doctor several times and was told to continue to take Tylenol and Zofran. She went to urgent care today, they're urinalysis showed trace hemoglobin and she was sent here for further evaluation. Patient denies any fever. She states pain is sharp, radiates from left flank into the left lower abdomen. She reports intermittent dysuria. Admits to nausea and vomiting. She reports she has also had some diarrhea. She denies history of kidney stones. She denies any other prior abdominal issues. She has history of tubal ligation and cholecystectomy. She states that her blood sugar has been higher than usual.  Past Medical History  Diagnosis Date  . Diabetes mellitus    Past Surgical History  Procedure Laterality Date  . Cholecystectomy     No family history on file. Social History  Substance Use Topics  . Smoking status: Light Tobacco Smoker    Types: Cigarettes  . Smokeless tobacco: None  . Alcohol Use: No   OB History    No data available     Review of Systems  Constitutional: Negative for fever and chills.  Respiratory: Negative for cough, chest tightness and shortness of breath.   Cardiovascular: Negative for chest pain, palpitations and leg swelling.  Gastrointestinal: Positive  for abdominal pain. Negative for nausea, vomiting and diarrhea.  Genitourinary: Positive for dysuria and flank pain. Negative for vaginal bleeding, vaginal discharge, vaginal pain and pelvic pain.  Musculoskeletal: Positive for back pain. Negative for myalgias, arthralgias, neck pain and neck stiffness.  Skin: Negative for rash.  Neurological: Negative for dizziness, weakness and headaches.  All other systems reviewed and are negative.     Allergies  Review of patient's allergies indicates no known allergies.  Home Medications   Prior to Admission medications   Medication Sig Start Date End Date Taking? Authorizing Provider  amoxicillin (AMOXIL) 500 MG capsule Take 1 capsule (500 mg total) by mouth 3 (three) times daily. 08/10/12   Tiffany Neva Seat, PA-C  amoxicillin (AMOXIL) 500 MG capsule Take 1 capsule (500 mg total) by mouth 3 (three) times daily. 01/16/13   Reuben Likes, MD  cephALEXin (KEFLEX) 500 MG capsule Take 1 capsule (500 mg total) by mouth 4 (four) times daily. Take all of medicine and drink lots of fluids 09/02/14   Linna Hoff, MD  Cyanocobalamin (VITAMIN B-12 PO) Take 1 tablet by mouth daily.    Historical Provider, MD  fluconazole (DIFLUCAN) 150 MG tablet Take 1 tablet (150 mg total) by mouth once. 09/02/14   Linna Hoff, MD  HYDROcodone-acetaminophen (NORCO/VICODIN) 5-325 MG per tablet Take 1 tablet by mouth every 4 (four) hours as needed for pain. 08/10/12   Tiffany Neva Seat, PA-C  insulin NPH (HUMULIN N,NOVOLIN N) 100 UNIT/ML injection Inject 20-23 Units into the skin 2 (two)  times daily. Per patients sliding scale     Historical Provider, MD  insulin regular (NOVOLIN R,HUMULIN R) 100 units/mL injection Inject 20-23 Units into the skin 2 (two) times daily before a meal. Per patients sliding scale     Historical Provider, MD  meloxicam (MOBIC) 15 MG tablet Take 1 tablet (15 mg total) by mouth daily. 01/16/13   Reuben Likesavid C Keller, MD  Multiple Vitamin (MULTIVITAMIN WITH MINERALS)  TABS Take 1 tablet by mouth daily.    Historical Provider, MD  ondansetron (ZOFRAN ODT) 4 MG disintegrating tablet Take 1 tablet (4 mg total) by mouth every 8 (eight) hours as needed for nausea. 08/10/12   Tiffany Neva SeatGreene, PA-C  ondansetron (ZOFRAN) 4 MG tablet Take 1 tablet (4 mg total) by mouth every 6 (six) hours. 07/30/15   Tharon AquasFrank C Patrick, PA  oxyCODONE-acetaminophen (PERCOCET) 5-325 MG per tablet 1 to 2 tablets every 6 hours as needed for pain. 01/16/13   Reuben Likesavid C Keller, MD  terconazole (TERAZOL 3) 80 MG vaginal suppository Place 1 suppository (80 mg total) vaginally at bedtime. 09/02/14   Linna HoffJames D Kindl, MD   Temp(Src) 98.3 F (36.8 C) (Oral)  Ht 5\' 3"  (1.6 m)  Wt 99.338 kg  BMI 38.80 kg/m2  SpO2 100%  LMP 07/23/2015 (Exact Date) Physical Exam  Constitutional: She is oriented to person, place, and time. She appears well-developed and well-nourished. No distress.  HENT:  Head: Normocephalic.  Eyes: Conjunctivae are normal.  Neck: Neck supple.  Cardiovascular: Normal rate, regular rhythm and normal heart sounds.   Pulmonary/Chest: Effort normal and breath sounds normal. No respiratory distress. She has no wheezes. She has no rales.  Abdominal: Soft. Bowel sounds are normal. She exhibits no distension. There is no rebound.  LLQ tenderness. Left CVA tenderness  Musculoskeletal: She exhibits no edema.  Neurological: She is alert and oriented to person, place, and time.  Skin: Skin is warm and dry.  Psychiatric: She has a normal mood and affect. Her behavior is normal.  Nursing note and vitals reviewed.   ED Course  Procedures (including critical care time) Labs Review Labs Reviewed  CBC WITH DIFFERENTIAL/PLATELET - Abnormal; Notable for the following:    WBC 11.9 (*)    Neutro Abs 8.2 (*)    All other components within normal limits  COMPREHENSIVE METABOLIC PANEL - Abnormal; Notable for the following:    Potassium 5.3 (*)    Glucose, Bld 242 (*)    All other components within  normal limits  LIPASE, BLOOD  I-STAT BETA HCG BLOOD, ED ([facility], [facility], [facility] ONLY)    Imaging Review No results found. I have personally reviewed and evaluated these images and lab results as part of my medical decision-making.   EKG Interpretation None      MDM   Final diagnoses:  None  Patient is here from urgent care, they are urinalysis showed trace hemoglobin, no white blood cells, no bacteria. She is afebrile. Symptoms concerning for possible kidney stone. Will get CT abdomen and pelvis. Will get a pregnancy test which was not done, and get what work. Pain medications and antiemetics ordered  10:45 PM Pt feeling better. K is 5.3, with noted hemolysis. Renal function is normal. WBC is 11.9. Glucose 242. CT pending  Pt signed out to Dr. Jeraldine LootsLockwood, will dispo based on results  Jaynie Crumbleatyana Kijuan Gallicchio, PA-C 07/31/15 0122  Gerhard Munchobert Lockwood, MD 08/03/15 [phone]

## 2015-07-30 NOTE — Discharge Instructions (Signed)
Flank Pain Flank pain is pain in your side. The flank is the area of your side between your upper belly (abdomen) and your back. Pain in this area can be caused by many different things. HOME CARE Home care and treatment will depend on the cause of your pain.  Rest as told by your doctor.  Drink enough fluids to keep your pee (urine) clear or pale yellow.  Only take medicine as told by your doctor.  Tell your doctor about any changes in your pain.  Follow up with your doctor. GET HELP RIGHT AWAY IF:   Your pain does not get better with medicine.   You have new symptoms or your symptoms get worse.  Your pain gets worse.   You have belly (abdominal) pain.   You are short of breath.   You always feel sick to your stomach (nauseous).   You keep throwing up (vomiting).   You have puffiness (swelling) in your belly.   You feel light-headed or you pass out (faint).   You have blood in your pee.  You have a fever or lasting symptoms for more than 2-3 days.  You have a fever and your symptoms suddenly get worse. MAKE SURE YOU:   Understand these instructions.  Will watch your condition.  Will get help right away if you are not doing well or get worse.   This information is not intended to replace advice given to you by your health care provider. Make sure you discuss any questions you have with your health care provider.   Document Released: 12/12/2007 Document Revised: 03/25/2014 Document Reviewed: 10/17/2011 Elsevier Interactive Patient Education 2016 [employer] Inc[employer]  Abdominal Pain, Adult Many things can cause belly (abdominal) pain. Most times, the belly pain is not dangerous. Many cases of belly pain can be watched and treated at home. HOME CARE   Do not take medicines that help you go poop (laxatives) unless told to by your doctor.  Only take medicine as told by your doctor.  Eat or drink as told by your doctor. Your doctor will tell you if you should  be on a special diet. GET HELP IF:  You do not know what is causing your belly pain.  You have belly pain while you are sick to your stomach (nauseous) or have runny poop (diarrhea).  You have pain while you pee or poop.  Your belly pain wakes you up at night.  You have belly pain that gets worse or better when you eat.  You have belly pain that gets worse when you eat fatty foods.  You have a fever. GET HELP RIGHT AWAY IF:   The pain does not go away within 2 hours.  You keep throwing up (vomiting).  The pain changes and is only in the right or left part of the belly.  You have bloody or tarry looking poop. MAKE SURE YOU:   Understand these instructions.  Will watch your condition.  Will get help right away if you are not doing well or get worse.   This information is not intended to replace advice given to you by your health care provider. Make sure you discuss any questions you have with your health care provider.   Document Released: 08/21/2007 Document Revised: 03/25/2014 Document Reviewed: 11/11/2012 Elsevier Interactive Patient Education [employer]2016 [employer] Inc[employer]

## 2015-07-30 NOTE — ED Provider Notes (Signed)
CSN: [id number]     Arrival date & time 07/30/15  1302 History   First MD Initiated Contact with Patient 07/30/15 1320     Chief Complaint  Patient presents with  . Abdominal Pain  . Back Pain   (Consider location/radiation/quality/duration/timing/severity/associated sxs/prior Treatment) HPI History obtained from patient:  Pt presents with the cc of: Abdominal pain, back pain, nausea Duration of symptoms: Several weeks Treatment prior to arrival: Zofran, ciprofloxacin Context: Actually 3 weeks ago patient states that she began to have abdominal and back pain associated nausea she has been seen by her primary care provider who did blood work and told her that her white count was elevated and placed her on ciprofloxacin. She states that she has finished a course of ciprofloxacin and still feels pretty rugged at this time. She is also having extreme nauseousness to the point that she is not eating. She also states that her blood sugars are been quite erratic running as high as 400 and at times his high as 600. States that she has reported this to her doctor and has been told to take more insulin. She states that she had a urinalysis done with her PCP and that was normal. She is currently out of Zofran at home. But is also looking for a definitive diagnosis of her abdominal pain and back pain. Other symptoms include: Pain score: FAMILY HISTORY: No family history of cancer SOCIAL HISTORY: Secondhand smoke exposure  Past Medical History  Diagnosis Date  . Diabetes mellitus    Past Surgical History  Procedure Laterality Date  . Cholecystectomy     History reviewed. No pertinent family history. Social History  Substance Use Topics  . Smoking status: Passive Smoke Exposure - Never Smoker  . Smokeless tobacco: None  . Alcohol Use: No   OB History    No data available     Review of Systems ROS +'veBack and abdominal pain, nausea  Denies: HEADACHE, CHEST PAIN, CONGESTION, DYSURIA,  SHORTNESS OF BREATH  Allergies  Review of patient's allergies indicates no known allergies.  Home Medications   Prior to Admission medications   Medication Sig Start Date End Date Taking? Authorizing Provider  insulin NPH (HUMULIN N,NOVOLIN N) 100 UNIT/ML injection Inject 20-23 Units into the skin 2 (two) times daily. Per patients sliding scale    Yes Historical Provider, MD  insulin regular (NOVOLIN R,HUMULIN R) 100 units/mL injection Inject 20-23 Units into the skin 2 (two) times daily before a meal. Per patients sliding scale    Yes Historical Provider, MD  ondansetron (ZOFRAN ODT) 4 MG disintegrating tablet Take 1 tablet (4 mg total) by mouth every 8 (eight) hours as needed for nausea. 08/10/12  Yes Tiffany Neva SeatGreene, PA-C  amoxicillin (AMOXIL) 500 MG capsule Take 1 capsule (500 mg total) by mouth 3 (three) times daily. 08/10/12   Tiffany Neva SeatGreene, PA-C  amoxicillin (AMOXIL) 500 MG capsule Take 1 capsule (500 mg total) by mouth 3 (three) times daily. 01/16/13   Reuben Likesavid C Keller, MD  cephALEXin (KEFLEX) 500 MG capsule Take 1 capsule (500 mg total) by mouth 4 (four) times daily. Take all of medicine and drink lots of fluids 09/02/14   Linna HoffJames D Kindl, MD  Cyanocobalamin (VITAMIN B-12 PO) Take 1 tablet by mouth daily.    Historical Provider, MD  fluconazole (DIFLUCAN) 150 MG tablet Take 1 tablet (150 mg total) by mouth once. 09/02/14   Linna HoffJames D Kindl, MD  HYDROcodone-acetaminophen (NORCO/VICODIN) 5-325 MG per tablet Take 1 tablet by mouth  every 4 (four) hours as needed for pain. 08/10/12   Tiffany Neva Seat, PA-C  meloxicam (MOBIC) 15 MG tablet Take 1 tablet (15 mg total) by mouth daily. 01/16/13   Reuben Likes, MD  Multiple Vitamin (MULTIVITAMIN WITH MINERALS) TABS Take 1 tablet by mouth daily.    Historical Provider, MD  oxyCODONE-acetaminophen (PERCOCET) 5-325 MG per tablet 1 to 2 tablets every 6 hours as needed for pain. 01/16/13   Reuben Likes, MD  terconazole (TERAZOL 3) 80 MG vaginal suppository Place 1  suppository (80 mg total) vaginally at bedtime. 09/02/14   Linna Hoff, MD   Meds Ordered and Administered this Visit  Medications - No data to display  BP 112/74 mmHg  Pulse 109  Temp(Src) 98.1 F (36.7 C) (Oral)  Resp 18  SpO2 97%  LMP 07/23/2015 No data found.   Physical Exam NURSES NOTES AND VITAL SIGNS REVIEWED. CONSTITUTIONAL: Well developed, well nourished, no acute distress HEENT: normocephalic, atraumatic EYES: Conjunctiva normal NECK:normal ROM, supple, no adenopathy PULMONARY:No respiratory distress, normal effort ABDOMINAL: Soft, ND, NT BS+, some left CVA-T. With radiation into groin MUSCULOSKELETAL: Normal ROM of all extremities,  SKIN: warm and dry without rash PSYCHIATRIC: Mood and affect, behavior are normal  ED Course  Procedures (including critical care time)  Labs Review Labs Reviewed  POCT URINALYSIS DIP (DEVICE) - Abnormal; Notable for the following:    Hgb urine dipstick TRACE (*)    Protein, ur 30 (*)    All other components within normal limits  POCT I-STAT, CHEM 8 - Abnormal; Notable for the following:    Hemoglobin 16.0 (*)    HCT 47.0 (*)    All other components within normal limits   I HAVE PERSONALLY REVIEWED TESTS RESULTS WITH PATIENT.  Imaging Review No results found.   Visual Acuity Review  Right Eye Distance:   Left Eye Distance:   Bilateral Distance:    Right Eye Near:   Left Eye Near:    Bilateral Near:      Labs Reviewed  POCT URINALYSIS DIP (DEVICE) - Abnormal; Notable for the following:    Hgb urine dipstick TRACE (*)    Protein, ur 30 (*)    All other components within normal limits  POCT I-STAT, CHEM 8 - Abnormal; Notable for the following:    Hemoglobin 16.0 (*)    HCT 47.0 (*)    All other components within normal limits      MDM   1. Abdominal pain in female patient   2. Lt flank pain    There is no emergent cause at this time that she needed to go to the emergency department now but you most  likely will need to have some imaging studies done to help determine the root of ear pain. I have discussed results of the lab tests with you but as discussed urgent care is very limited to what can be diagnosed and worked up additionally in the setting. Therefore it is in your best interest to follow up with her primary care provider or attend the emergency department.    Tharon Aquas, PA 07/30/15 1409

## 2016-01-30 ENCOUNTER — Encounter (HOSPITAL_COMMUNITY): Payer: Self-pay | Admitting: Family Medicine

## 2016-01-30 ENCOUNTER — Ambulatory Visit (HOSPITAL_COMMUNITY)
Admission: EM | Admit: 2016-01-30 | Discharge: 2016-01-30 | Disposition: A | Discharge status: Home / Self Care | Payer: Medicaid Other | Attending: Family Medicine | Admitting: Family Medicine

## 2016-01-30 DIAGNOSIS — K0889 Other specified disorders of teeth and supporting structures: Secondary | ICD-10-CM

## 2016-01-30 DIAGNOSIS — K047 Periapical abscess without sinus: Secondary | ICD-10-CM

## 2016-01-30 DIAGNOSIS — K029 Dental caries, unspecified: Secondary | ICD-10-CM | POA: Diagnosis not present

## 2016-01-30 MED ORDER — AMOXICILLIN 500 MG PO CAPS: 1000.0000 mg | ORAL_CAPSULE | Freq: Two times a day (BID) | ORAL | 0 refills | Status: DC

## 2016-01-30 MED ORDER — HYDROCODONE-ACETAMINOPHEN 5-325 MG PO TABS: 1.0000 | ORAL_TABLET | ORAL | 0 refills | Status: DC | PRN

## 2016-01-30 NOTE — ED Provider Notes (Signed)
CSN: [id number]     Arrival date & time 01/30/16  1551 History   None    Chief Complaint  Patient presents with  . Dental Pain   (Consider location/radiation/quality/duration/timing/severity/associated sxs/prior Treatment) 35 year old obese female with type 2 diabetes mellitus is complaining of left jaw swelling and pain. She admits to having poor dental repair and several decayed teeth. The pain is located primarily to the left lower row of teeth and gingival line. She states she had "chipped teeth" for approximately 2 months.      Past Medical History:  Diagnosis Date  . Diabetes mellitus    Past Surgical History:  Procedure Laterality Date  . CHOLECYSTECTOMY     History reviewed. No pertinent family history. Social History  Substance Use Topics  . Smoking status: Light Tobacco Smoker    Types: Cigarettes  . Smokeless tobacco: Never Used  . Alcohol use No   OB History    No data available     Review of Systems  Constitutional: Negative.   HENT: Positive for dental problem and facial swelling.   Eyes: Negative.   Respiratory: Negative.   Skin: Negative.   Neurological: Negative.   All other systems reviewed and are negative.   Allergies  Patient has no known allergies.  Home Medications   Prior to Admission medications   Medication Sig Start Date End Date Taking? Authorizing Provider  amoxicillin (AMOXIL) 500 MG capsule Take 2 capsules (1,000 mg total) by mouth 2 (two) times daily. 01/30/16   Hayden Rasmussenavid Anayla Giannetti, NP  Aspirin-Caffeine (BAYER BACK & BODY PO) Take 1 tablet by mouth daily as needed (pain).    Historical Provider, MD  HYDROcodone-acetaminophen (NORCO/VICODIN) 5-325 MG tablet Take 1 tablet by mouth every 4 (four) hours as needed. 01/30/16   Hayden Rasmussenavid Jakylan Ron, NP  insulin NPH (HUMULIN N,NOVOLIN N) 100 UNIT/ML injection Inject 18-22 Units into the skin 2 (two) times daily before a meal. Per patients sliding scale: breakfast - 20-22 units,  supper 18-20 units     Historical Provider, MD  insulin regular (NOVOLIN R,HUMULIN R) 100 units/mL injection Inject 18-24 Units into the skin 2 (two) times daily before a meal. Per patients sliding scale: breakfast 20-24 units, supper 18-20    Historical Provider, MD  LUTEIN PO Take 1 capsule by mouth 2 (two) times daily.    Historical Provider, MD  meloxicam (MOBIC) 15 MG tablet Take 1 tablet (15 mg total) by mouth daily. Patient not taking: Reported on 07/30/2015 01/16/13   Reuben Likesavid C Keller, MD  Multiple Vitamin (MULTIVITAMIN WITH MINERALS) TABS Take 1 tablet by mouth daily.    Historical Provider, MD  Multiple Vitamins-Minerals (AIRBORNE PO) Take 1 tablet by mouth daily.    Historical Provider, MD  naproxen sodium (ALEVE) 220 MG tablet Take 880 mg by mouth 2 (two) times daily as needed (pain).    Historical Provider, MD  ondansetron (ZOFRAN ODT) 4 MG disintegrating tablet Take 1 tablet (4 mg total) by mouth every 8 (eight) hours as needed for nausea. Patient not taking: Reported on 07/30/2015 08/10/12   Marlon Peliffany Greene, PA-C  ondansetron (ZOFRAN) 4 MG tablet Take 1 tablet (4 mg total) by mouth every 6 (six) hours. 07/30/15   Tharon AquasFrank C Patrick, PA  oxyCODONE-acetaminophen (PERCOCET) 5-325 MG per tablet 1 to 2 tablets every 6 hours as needed for pain. Patient not taking: Reported on 07/30/2015 01/16/13   Reuben Likesavid C Keller, MD   Meds Ordered and Administered this Visit  Medications - No data to display  BP 115/72 (BP Location: Right Arm)   Pulse 94   Temp 98.3 F (36.8 C) (Oral)   Resp 16   LMP 01/10/2016   SpO2 98%  No data found.   Physical Exam  Constitutional: She appears well-developed and well-nourished. No distress.  HENT:  Head: Normocephalic and atraumatic.  Mouth/Throat: Oropharynx is clear and moist.  Several teeth with poor dental repair. Several teeth have decayed to the gingival line. One tender tooth in the left lower jaw has decayed thigh proximally 75% leaving some pulp exposure. The surrounding gingiva  with erythema and swelling as well as a chronic mucosa swelling.  Neck: Normal range of motion. Neck supple.  Cardiovascular: Normal rate.   Pulmonary/Chest: Effort normal.  Skin: Skin is warm and dry.  Psychiatric: She has a normal mood and affect.  Nursing note and vitals reviewed.   Urgent Care Course   Clinical Course     Procedures (including critical care time)  Labs Review Labs Reviewed - No data to display  Imaging Review No results found.   Visual Acuity Review  Right Eye Distance:   Left Eye Distance:   Bilateral Distance:    Right Eye Near:   Left Eye Near:    Bilateral Near:         MDM   1. Pain, dental   2. Dental caries   3. Dental infection    NJeed follow-up with a dentist as soon as possible. If you need additional medications follow-up with Dr. Ardelle ParkHaque. Meds ordered this encounter  Medications  . amoxicillin (AMOXIL) 500 MG capsule    Sig: Take 2 capsules (1,000 mg total) by mouth 2 (two) times daily.    Dispense:  40 capsule    Refill:  0    Order Specific Question:   Supervising Provider    Answer:   Linna HoffKINDL, JAMES D [phone][[id number]]  . HYDROcodone-acetaminophen (NORCO/VICODIN) 5-325 MG tablet    Sig: Take 1 tablet by mouth every 4 (four) hours as needed.    Dispense:  15 tablet    Refill:  0    Order Specific Question:   Supervising Provider    Answer:   Linna HoffKINDL, JAMES D [[id number]]       Hayden Rasmussenavid Yailyn Strack, NP 01/30/16 1816

## 2016-01-30 NOTE — Discharge Instructions (Signed)
Need follow-up with a dentist as soon as possible. If you need additional medications follow-up with Dr. Ardelle ParkHaque.

## 2016-01-30 NOTE — ED Triage Notes (Signed)
Pt here for left lower dental pain, broken teeth and swelling. sts started last night.

## 2019-09-02 ENCOUNTER — Encounter (INDEPENDENT_AMBULATORY_CARE_PROVIDER_SITE_OTHER): Payer: Medicare Other | Admitting: Ophthalmology

## 2019-09-02 ENCOUNTER — Other Ambulatory Visit: Payer: Self-pay

## 2019-09-02 DIAGNOSIS — H43813 Vitreous degeneration, bilateral: Secondary | ICD-10-CM

## 2019-09-02 DIAGNOSIS — E103522 Type 1 diabetes mellitus with proliferative diabetic retinopathy with traction retinal detachment involving the macula, left eye: Secondary | ICD-10-CM

## 2019-09-02 DIAGNOSIS — E103511 Type 1 diabetes mellitus with proliferative diabetic retinopathy with macular edema, right eye: Secondary | ICD-10-CM

## 2019-09-02 DIAGNOSIS — E10311 Type 1 diabetes mellitus with unspecified diabetic retinopathy with macular edema: Secondary | ICD-10-CM

## 2019-09-02 DIAGNOSIS — H2511 Age-related nuclear cataract, right eye: Secondary | ICD-10-CM

## 2019-09-02 DIAGNOSIS — H4311 Vitreous hemorrhage, right eye: Secondary | ICD-10-CM

## 2019-10-29 ENCOUNTER — Other Ambulatory Visit: Payer: Self-pay

## 2019-10-29 ENCOUNTER — Encounter (INDEPENDENT_AMBULATORY_CARE_PROVIDER_SITE_OTHER): Payer: Medicare Other | Admitting: Ophthalmology

## 2019-10-29 DIAGNOSIS — H43813 Vitreous degeneration, bilateral: Secondary | ICD-10-CM

## 2019-10-29 DIAGNOSIS — H2511 Age-related nuclear cataract, right eye: Secondary | ICD-10-CM

## 2019-10-29 DIAGNOSIS — E103593 Type 1 diabetes mellitus with proliferative diabetic retinopathy without macular edema, bilateral: Secondary | ICD-10-CM | POA: Diagnosis not present

## 2019-10-29 DIAGNOSIS — E103522 Type 1 diabetes mellitus with proliferative diabetic retinopathy with traction retinal detachment involving the macula, left eye: Secondary | ICD-10-CM

## 2019-10-29 DIAGNOSIS — E10319 Type 1 diabetes mellitus with unspecified diabetic retinopathy without macular edema: Secondary | ICD-10-CM | POA: Diagnosis not present

## 2020-05-05 ENCOUNTER — Encounter (INDEPENDENT_AMBULATORY_CARE_PROVIDER_SITE_OTHER): Payer: Medicare Other | Admitting: Ophthalmology

## 2020-08-09 ENCOUNTER — Other Ambulatory Visit: Payer: Self-pay

## 2020-08-09 ENCOUNTER — Encounter (INDEPENDENT_AMBULATORY_CARE_PROVIDER_SITE_OTHER): Payer: Medicare Other | Admitting: Ophthalmology

## 2020-08-09 DIAGNOSIS — H2511 Age-related nuclear cataract, right eye: Secondary | ICD-10-CM | POA: Diagnosis not present

## 2020-08-09 DIAGNOSIS — H43811 Vitreous degeneration, right eye: Secondary | ICD-10-CM

## 2020-08-09 DIAGNOSIS — E103593 Type 1 diabetes mellitus with proliferative diabetic retinopathy without macular edema, bilateral: Secondary | ICD-10-CM | POA: Diagnosis not present

## 2020-08-09 DIAGNOSIS — E103522 Type 1 diabetes mellitus with proliferative diabetic retinopathy with traction retinal detachment involving the macula, left eye: Secondary | ICD-10-CM

## 2020-08-23 ENCOUNTER — Emergency Department
Admission: EM | Admit: 2020-08-23 | Discharge: 2020-08-23 | Disposition: A | Discharge status: Home / Self Care | Payer: Medicare Other | Attending: Emergency Medicine | Admitting: Emergency Medicine

## 2020-08-23 ENCOUNTER — Emergency Department: Payer: Medicare Other

## 2020-08-23 ENCOUNTER — Other Ambulatory Visit: Payer: Self-pay

## 2020-08-23 DIAGNOSIS — F17210 Nicotine dependence, cigarettes, uncomplicated: Secondary | ICD-10-CM | POA: Diagnosis not present

## 2020-08-23 DIAGNOSIS — N12 Tubulo-interstitial nephritis, not specified as acute or chronic: Secondary | ICD-10-CM | POA: Insufficient documentation

## 2020-08-23 DIAGNOSIS — E119 Type 2 diabetes mellitus without complications: Secondary | ICD-10-CM | POA: Insufficient documentation

## 2020-08-23 DIAGNOSIS — R109 Unspecified abdominal pain: Secondary | ICD-10-CM | POA: Diagnosis present

## 2020-08-23 DIAGNOSIS — Z794 Long term (current) use of insulin: Secondary | ICD-10-CM | POA: Insufficient documentation

## 2020-08-23 LAB — URINALYSIS, COMPLETE (UACMP) WITH MICROSCOPIC
Bilirubin Urine: NEGATIVE
Glucose, UA: 50 mg/dL — AB
Ketones, ur: 5 mg/dL — AB
Nitrite: NEGATIVE
Protein, ur: 100 mg/dL — AB
Specific Gravity, Urine: 1.019 (ref 1.005–1.030)
WBC, UA: >50 WBC/hpf — ABNORMAL HIGH (ref 0–5)
pH: 5 (ref 5.0–8.0)

## 2020-08-23 LAB — CBC WITH DIFFERENTIAL/PLATELET
Abs Immature Granulocytes: 0.06 K/uL (ref 0.00–0.07)
Basophils Absolute: 0.1 K/uL (ref 0.0–0.1)
Basophils Relative: 1 %
Eosinophils Absolute: 0.1 K/uL (ref 0.0–0.5)
Eosinophils Relative: 1 %
HCT: 43 % (ref 36.0–46.0)
Hemoglobin: 14.7 g/dL (ref 12.0–15.0)
Immature Granulocytes: 0 %
Lymphocytes Relative: 10 %
Lymphs Abs: 1.6 K/uL (ref 0.7–4.0)
MCH: 30 pg (ref 26.0–34.0)
MCHC: 34.2 g/dL (ref 30.0–36.0)
MCV: 87.8 fL (ref 80.0–100.0)
Monocytes Absolute: 1.2 K/uL — ABNORMAL HIGH (ref 0.1–1.0)
Monocytes Relative: 7 %
Neutro Abs: 13.2 K/uL — ABNORMAL HIGH (ref 1.7–7.7)
Neutrophils Relative %: 81 %
Platelets: 241 K/uL (ref 150–400)
RBC: 4.9 MIL/uL (ref 3.87–5.11)
RDW: 13.4 % (ref 11.5–15.5)
WBC: 16.2 K/uL — ABNORMAL HIGH (ref 4.0–10.5)
nRBC: 0 % (ref 0.0–0.2)

## 2020-08-23 LAB — COMPREHENSIVE METABOLIC PANEL WITH GFR
ALT: 13 U/L (ref 0–44)
AST: 16 U/L (ref 15–41)
Albumin: 4.3 g/dL (ref 3.5–5.0)
Alkaline Phosphatase: 81 U/L (ref 38–126)
Anion gap: 10 (ref 5–15)
BUN: 11 mg/dL (ref 6–20)
CO2: 23 mmol/L (ref 22–32)
Calcium: 9.4 mg/dL (ref 8.9–10.3)
Chloride: 100 mmol/L (ref 98–111)
Creatinine, Ser: 0.77 mg/dL (ref 0.44–1.00)
GFR, Estimated: >60 mL/min
Glucose, Bld: 150 mg/dL — ABNORMAL HIGH (ref 70–99)
Potassium: 3.5 mmol/L (ref 3.5–5.1)
Sodium: 133 mmol/L — ABNORMAL LOW (ref 135–145)
Total Bilirubin: 0.9 mg/dL (ref 0.3–1.2)
Total Protein: 9.1 g/dL — ABNORMAL HIGH (ref 6.5–8.1)

## 2020-08-23 LAB — WET PREP, GENITAL
Clue Cells Wet Prep HPF POC: NONE SEEN
Sperm: NONE SEEN
Trich, Wet Prep: NONE SEEN
Yeast Wet Prep HPF POC: NONE SEEN

## 2020-08-23 LAB — CHLAMYDIA/NGC RT PCR (ARMC ONLY)
Chlamydia Tr: NOT DETECTED
N gonorrhoeae: NOT DETECTED

## 2020-08-23 LAB — POC URINE PREG, ED: Preg Test, Ur: NEGATIVE

## 2020-08-23 LAB — LACTIC ACID, PLASMA: Lactic Acid, Venous: 1.8 mmol/L (ref 0.5–1.9)

## 2020-08-23 MED ORDER — CEFTRIAXONE SODIUM 1 G IJ SOLR
1.0000 g | Freq: Once | INTRAMUSCULAR | Status: AC
  Administered 2020-08-23: 1 g via INTRAMUSCULAR
  Filled 2020-08-23: qty 10

## 2020-08-23 MED ORDER — CEPHALEXIN 500 MG PO CAPS: 500.0000 mg | ORAL_CAPSULE | Freq: Four times a day (QID) | ORAL | 0 refills | Status: AC

## 2020-08-23 MED ORDER — SODIUM CHLORIDE 0.9 % IV SOLN
1.0000 g | Freq: Once | INTRAVENOUS | Status: AC
  Administered 2020-08-23: 1 g via INTRAVENOUS
  Filled 2020-08-23: qty 10

## 2020-08-23 MED ORDER — ONDANSETRON HCL 4 MG/2ML IJ SOLN
4.0000 mg | Freq: Once | INTRAMUSCULAR | Status: AC
  Administered 2020-08-23: 4 mg via INTRAVENOUS
  Filled 2020-08-23: qty 2

## 2020-08-23 NOTE — ED Provider Notes (Signed)
[facility]-EMERGENCY DEPARTMENT  ____________________________________________  Time seen: Approximately 7:03 PM  I have reviewed the triage vital signs and the nursing notes.   HISTORY  Chief Complaint No chief complaint on file.   Historian Patient     HPI Katie Hampton is a 40 y.o. female presents to the emergency department with dysuria and increased urinary frequency that is occurred for 1 month.  Patient reports that she has taken 3 antibiotics prescribed by her PCP.  Patient states that she has never had a time.  In the past month where she felt complete resolution in her symptoms.  Patient states that she has had nausea, flank pain and suprapubic pain developed today.  She reports a fever at home of 102.5.  She denies vomiting.  She also reports that her glucose levels have been uncontrolled at home.  No chest pain, chest tightness or shortness of breath.   Past Medical History:  Diagnosis Date  . Diabetes mellitus      Immunizations up to date:  Yes.     Past Medical History:  Diagnosis Date  . Diabetes mellitus     There are no problems to display for this patient.   Past Surgical History:  Procedure Laterality Date  . CHOLECYSTECTOMY      Prior to Admission medications   Medication Sig Start Date End Date Taking? Authorizing Provider  cephALEXin (KEFLEX) 500 MG capsule Take 1 capsule (500 mg total) by mouth 4 (four) times daily for 7 days. 08/23/20 08/30/20 Yes Pia Mau M, PA-C  amoxicillin (AMOXIL) 500 MG capsule Take 2 capsules (1,000 mg total) by mouth 2 (two) times daily. 01/30/16   Hayden Rasmussen, NP  Aspirin-Caffeine (BAYER BACK & BODY PO) Take 1 tablet by mouth daily as needed (pain).    [provider]  HYDROcodone-acetaminophen (NORCO/VICODIN) 5-325 MG tablet Take 1 tablet by mouth every 4 (four) hours as needed. 01/30/16   Hayden Rasmussen, NP  insulin NPH (HUMULIN N,NOVOLIN N) 100 UNIT/ML injection Inject 18-22 Units into the skin 2 (two)  times daily before a meal. Per patients sliding scale: breakfast - 20-22 units,  supper 18-20 units    [provider]  insulin regular (NOVOLIN R,HUMULIN R) 100 units/mL injection Inject 18-24 Units into the skin 2 (two) times daily before a meal. Per patients sliding scale: breakfast 20-24 units, supper 18-20    [provider]  LUTEIN PO Take 1 capsule by mouth 2 (two) times daily.    [provider]  meloxicam (MOBIC) 15 MG tablet Take 1 tablet (15 mg total) by mouth daily. Patient not taking: Reported on 07/30/2015 01/16/13   Reuben Likes, MD  Multiple Vitamin (MULTIVITAMIN WITH MINERALS) TABS Take 1 tablet by mouth daily.    [provider]  Multiple Vitamins-Minerals (AIRBORNE PO) Take 1 tablet by mouth daily.    [provider]  naproxen sodium (ALEVE) 220 MG tablet Take 880 mg by mouth 2 (two) times daily as needed (pain).    [provider]  ondansetron (ZOFRAN ODT) 4 MG disintegrating tablet Take 1 tablet (4 mg total) by mouth every 8 (eight) hours as needed for nausea. Patient not taking: Reported on 07/30/2015 08/10/12   Marlon Pel, PA-C  ondansetron (ZOFRAN) 4 MG tablet Take 1 tablet (4 mg total) by mouth every 6 (six) hours. 07/30/15   Tharon Aquas, PA  oxyCODONE-acetaminophen (PERCOCET) 5-325 MG per tablet 1 to 2 tablets every 6 hours as needed for pain. Patient not taking: Reported  on 07/30/2015 01/16/13   Reuben Likes, MD    Allergies Patient has no known allergies.  History reviewed. No pertinent family history.  Social History Social History   Tobacco Use  . Smoking status: Light Tobacco Smoker    Types: Cigarettes  . Smokeless tobacco: Never Used  Substance Use Topics  . Alcohol use: No     Review of Systems  Constitutional: No fever/chills Eyes:  No discharge ENT: No upper respiratory complaints. Respiratory: no cough. No SOB/ use of accessory muscles to breath Gastrointestinal:   No nausea, no  vomiting.  No diarrhea.  No constipation. Genitourinary: Patient has dysuria and increased urinary frequency.  Musculoskeletal: Negative for musculoskeletal pain. Skin: Negative for rash, abrasions, lacerations, ecchymosis.   ____________________________________________   PHYSICAL EXAM:  VITAL SIGNS: ED Triage Vitals  Enc Vitals Group     BP 08/23/20 1625 (!) 110/92     Pulse Rate 08/23/20 1625 (!) 111     Resp 08/23/20 1625 18     Temp 08/23/20 1625 98.8 F (37.1 C)     Temp Source 08/23/20 1625 Oral     SpO2 08/23/20 1625 97 %     Weight 08/23/20 1627 225 lb (102.1 kg)     Height 08/23/20 1627 5\' 2"  (1.575 m)     Head Circumference --      Peak Flow --      Pain Score 08/23/20 1626 10     Pain Loc --      Pain Edu? --      Excl. in GC? --      Constitutional: Alert and oriented. Well appearing and in no acute distress. Eyes: Conjunctivae are normal. PERRL. EOMI. Head: Atraumatic. ENT:      Nose: No congestion/rhinnorhea.      Mouth/Throat: Mucous membranes are moist.  Neck: No stridor.  No cervical spine tenderness to palpation. Cardiovascular: Normal rate, regular rhythm. Normal S1 and S2.  Good peripheral circulation. Respiratory: Normal respiratory effort without tachypnea or retractions. Lungs CTAB. Good air entry to the bases with no decreased or absent breath sounds Gastrointestinal: Bowel sounds x 4 quadrants. Soft and nontender to palpation. No guarding or rigidity. No distention. Musculoskeletal: Full range of motion to all extremities. No obvious deformities noted Neurologic:  Normal for age. No gross focal neurologic deficits are appreciated.  Skin:  Skin is warm, dry and intact. No rash noted. Psychiatric: Mood and affect are normal for age. Speech and behavior are normal.   ____________________________________________   LABS (all labs ordered are listed, but only abnormal results are displayed)  Labs Reviewed  WET PREP, GENITAL - Abnormal; Notable  for the following components:      Result Value   WBC, Wet Prep HPF POC FEW (*)    All other components within normal limits  URINALYSIS, COMPLETE (UACMP) WITH MICROSCOPIC - Abnormal; Notable for the following components:   Color, Urine YELLOW (*)    APPearance CLOUDY (*)    Glucose, UA 50 (*)    Hgb urine dipstick SMALL (*)    Ketones, ur 5 (*)    Protein, ur 100 (*)    Leukocytes,Ua LARGE (*)    WBC, UA >50 (*)    Bacteria, UA RARE (*)    All other components within normal limits  CBC WITH DIFFERENTIAL/PLATELET - Abnormal; Notable for the following components:   WBC 16.2 (*)    Neutro Abs 13.2 (*)    Monocytes Absolute 1.2 (*)  All other components within normal limits  COMPREHENSIVE METABOLIC PANEL - Abnormal; Notable for the following components:   Sodium 133 (*)    Glucose, Bld 150 (*)    Total Protein 9.1 (*)    All other components within normal limits  CHLAMYDIA/NGC RT PCR ([facility] ONLY)  URINE CULTURE  LACTIC ACID, PLASMA  LACTIC ACID, PLASMA  POC URINE PREG, ED   ____________________________________________  EKG   ____________________________________________  RADIOLOGY Geraldo Pitter, personally viewed and evaluated these images (plain radiographs) as part of my medical decision making, as well as reviewing the written report by the radiologist.    CT Renal Stone Study  Result Date: 08/23/2020 CLINICAL DATA:  Back pain and dysuria. EXAM: CT ABDOMEN AND PELVIS WITHOUT CONTRAST TECHNIQUE: Multidetector CT imaging of the abdomen and pelvis was performed following the standard protocol without IV contrast. COMPARISON:  Aug 16, 2015 FINDINGS: Lower chest: No acute abnormality. Hepatobiliary: No focal liver abnormality is seen. Status post cholecystectomy. No biliary dilatation. Pancreas: Unremarkable. No pancreatic ductal dilatation or surrounding inflammatory changes. Spleen: Normal in size without focal abnormality. Adrenals/Urinary Tract: Adrenal glands  are unremarkable. Kidneys are normal, without obstructing renal calculi, focal lesion, or hydronephrosis. A 3 mm nonobstructing renal stone is seen within the mid to lower right kidney. Very mild left-sided perinephric inflammatory fat stranding is seen. Bladder is unremarkable. Stomach/Bowel: Stomach is within normal limits. Appendix appears normal. No evidence of bowel wall thickening, distention, or inflammatory changes. Vascular/Lymphatic: No significant vascular findings are present. No enlarged abdominal or pelvic lymph nodes. Reproductive: Uterus and bilateral adnexa are unremarkable. Other: No abdominal wall hernia or abnormality. No abdominopelvic ascites. Musculoskeletal: No acute or significant osseous findings. IMPRESSION: 1. 3 mm nonobstructing renal stone within the right kidney. 2. Very mild left perinephric inflammatory fat stranding which may represent sequelae associated with acute pyelonephritis. Correlation with urinalysis is recommended. 3. Evidence of prior cholecystectomy. Electronically Signed   By: Aram Candela M.D.   On: 08/23/2020 19:19    ____________________________________________    PROCEDURES  Procedure(s) performed:     Procedures     Medications  ondansetron [facility]) injection 4 mg (4 mg Intravenous Given 08/23/20 2014)  cefTRIAXone (ROCEPHIN) injection 1 g (1 g Intramuscular Given 08/23/20 2106)  cefTRIAXone (ROCEPHIN) 1 g in sodium chloride 0.9 % 100 mL IVPB (0 g Intravenous Stopped 08/23/20 2156)     ____________________________________________   INITIAL IMPRESSION / ASSESSMENT AND PLAN / ED COURSE  Pertinent labs & imaging results that were available during my care of the patient were reviewed by me and considered in my medical decision making (see chart for details).      Assessment and Plan: Flank pain Dysuria Nausea Fever  40 year old female presents to the emergency department with dysuria, increased urinary frequency, flank pain and  nausea.  Patient was tachycardic in triage but vital signs were otherwise reassuring.  Urinalysis indicated a large number of leukocytes and rare bacteria with a small amount of blood.  Urine culture is pending.  CT renal stone study showed no evidence of nephrolithiasis.  CT renal stone study findings did suggest pyelonephritis.  Urine pregnancy testing was negative.  Patient had leukocytosis on CBC.  Patient was given IV Rocephin in the emergency department and discharged with Keflex.  Return precautions were given to return with new or worsening symptoms.  All patient questions were answered.     ____________________________________________  FINAL CLINICAL IMPRESSION(S) / ED DIAGNOSES  Final diagnoses:  Pyelonephritis  NEW MEDICATIONS STARTED DURING THIS VISIT:  ED Discharge Orders         Ordered    cephALEXin (KEFLEX) 500 MG capsule  4 times daily        08/23/20 2148              This chart was dictated using voice recognition software/Dragon. Despite best efforts to proofread, errors can occur which can change the meaning. Any change was purely unintentional.     Gasper LloydWoods, Marinus Eicher M, PA-C 08/23/20 2205    Phineas SemenGoodman, [place], MD 08/23/20 2207

## 2020-08-23 NOTE — Discharge Instructions (Signed)
Take Keflex four times daily for seven days.  °

## 2020-08-23 NOTE — ED Triage Notes (Signed)
Pt states UTI symptoms, dx with UTI today at doctors but states that her doctor called in macrobid which her last urine culture grew that macrobid didn't work on her UTI. C/o back pain, dysuria.

## 2020-08-26 LAB — URINE CULTURE: Culture: >=100000 — AB

## 2020-09-14 ENCOUNTER — Encounter (INDEPENDENT_AMBULATORY_CARE_PROVIDER_SITE_OTHER): Payer: Medicare Other | Admitting: Ophthalmology

## 2020-09-14 ENCOUNTER — Other Ambulatory Visit: Payer: Self-pay

## 2020-09-14 DIAGNOSIS — E103511 Type 1 diabetes mellitus with proliferative diabetic retinopathy with macular edema, right eye: Secondary | ICD-10-CM | POA: Diagnosis not present

## 2020-09-14 DIAGNOSIS — E103592 Type 1 diabetes mellitus with proliferative diabetic retinopathy without macular edema, left eye: Secondary | ICD-10-CM | POA: Diagnosis not present

## 2020-09-14 DIAGNOSIS — H43811 Vitreous degeneration, right eye: Secondary | ICD-10-CM | POA: Diagnosis not present

## 2020-12-12 ENCOUNTER — Encounter (INDEPENDENT_AMBULATORY_CARE_PROVIDER_SITE_OTHER): Payer: Medicare Other | Admitting: Ophthalmology

## 2021-03-09 ENCOUNTER — Emergency Department
Admission: EM | Admit: 2021-03-09 | Discharge: 2021-03-09 | Disposition: A | Discharge status: Home / Self Care | Payer: Medicare Other | Attending: Emergency Medicine | Admitting: Emergency Medicine

## 2021-03-09 ENCOUNTER — Other Ambulatory Visit: Payer: Self-pay

## 2021-03-09 ENCOUNTER — Encounter: Payer: Self-pay | Admitting: Emergency Medicine

## 2021-03-09 DIAGNOSIS — N390 Urinary tract infection, site not specified: Secondary | ICD-10-CM | POA: Insufficient documentation

## 2021-03-09 DIAGNOSIS — Z794 Long term (current) use of insulin: Secondary | ICD-10-CM | POA: Diagnosis not present

## 2021-03-09 DIAGNOSIS — F17210 Nicotine dependence, cigarettes, uncomplicated: Secondary | ICD-10-CM | POA: Diagnosis not present

## 2021-03-09 DIAGNOSIS — E119 Type 2 diabetes mellitus without complications: Secondary | ICD-10-CM | POA: Insufficient documentation

## 2021-03-09 DIAGNOSIS — E876 Hypokalemia: Secondary | ICD-10-CM | POA: Diagnosis not present

## 2021-03-09 DIAGNOSIS — R300 Dysuria: Secondary | ICD-10-CM | POA: Diagnosis present

## 2021-03-09 LAB — CBC WITH DIFFERENTIAL/PLATELET
Abs Immature Granulocytes: 0.12 10*3/uL — ABNORMAL HIGH (ref 0.00–0.07)
Basophils Absolute: 0.1 10*3/uL (ref 0.0–0.1)
Basophils Relative: 0 %
Eosinophils Absolute: 0.2 10*3/uL (ref 0.0–0.5)
Eosinophils Relative: 1 %
HCT: 40.7 % (ref 36.0–46.0)
Hemoglobin: 14.1 g/dL (ref 12.0–15.0)
Immature Granulocytes: 1 %
Lymphocytes Relative: 10 %
Lymphs Abs: 1.7 10*3/uL (ref 0.7–4.0)
MCH: 31.3 pg (ref 26.0–34.0)
MCHC: 34.6 g/dL (ref 30.0–36.0)
MCV: 90.4 fL (ref 80.0–100.0)
Monocytes Absolute: 0.9 10*3/uL (ref 0.1–1.0)
Monocytes Relative: 6 %
Neutro Abs: 14 10*3/uL — ABNORMAL HIGH (ref 1.7–7.7)
Neutrophils Relative %: 82 %
Platelets: 425 10*3/uL — ABNORMAL HIGH (ref 150–400)
RBC: 4.5 MIL/uL (ref 3.87–5.11)
RDW: 12.7 % (ref 11.5–15.5)
WBC: 17 10*3/uL — ABNORMAL HIGH (ref 4.0–10.5)
nRBC: 0 % (ref 0.0–0.2)

## 2021-03-09 LAB — URINALYSIS, COMPLETE (UACMP) WITH MICROSCOPIC
Glucose, UA: NEGATIVE mg/dL
Nitrite: POSITIVE — AB
Protein, ur: >300 mg/dL — AB
RBC / HPF: >50 RBC/hpf — ABNORMAL HIGH (ref 0–5)
Specific Gravity, Urine: 1.025 (ref 1.005–1.030)
WBC, UA: >50 WBC/hpf — ABNORMAL HIGH (ref 0–5)
pH: 6 (ref 5.0–8.0)

## 2021-03-09 LAB — BASIC METABOLIC PANEL
Anion gap: 7 (ref 5–15)
BUN: 8 mg/dL (ref 6–20)
CO2: 26 mmol/L (ref 22–32)
Calcium: 8.4 mg/dL — ABNORMAL LOW (ref 8.9–10.3)
Chloride: 102 mmol/L (ref 98–111)
Creatinine, Ser: 0.52 mg/dL (ref 0.44–1.00)
GFR, Estimated: >60 mL/min (ref >60)
Glucose, Bld: 113 mg/dL — ABNORMAL HIGH (ref 70–99)
Potassium: 2.9 mmol/L — ABNORMAL LOW (ref 3.5–5.1)
Sodium: 135 mmol/L (ref 135–145)

## 2021-03-09 LAB — POC URINE PREG, ED: Preg Test, Ur: NEGATIVE

## 2021-03-09 MED ORDER — ONDANSETRON 4 MG PO TBDP: 4.0000 mg | ORAL_TABLET | Freq: Three times a day (TID) | ORAL | 0 refills | Status: DC | PRN

## 2021-03-09 MED ORDER — CEFDINIR 300 MG PO CAPS: 300.0000 mg | ORAL_CAPSULE | Freq: Two times a day (BID) | ORAL | 0 refills | Status: DC

## 2021-03-09 MED ORDER — SODIUM CHLORIDE 0.9 % IV SOLN
1.0000 g | Freq: Once | INTRAVENOUS | Status: AC
  Administered 2021-03-09: 12:00:00 1 g via INTRAVENOUS
  Filled 2021-03-09: qty 10

## 2021-03-09 MED ORDER — POTASSIUM CHLORIDE CRYS ER 20 MEQ PO TBCR
30.0000 meq | EXTENDED_RELEASE_TABLET | Freq: Once | ORAL | Status: AC
  Administered 2021-03-09: 14:00:00 30 meq via ORAL
  Filled 2021-03-09: qty 2

## 2021-03-09 MED ORDER — ONDANSETRON HCL 4 MG/2ML IJ SOLN
4.0000 mg | Freq: Once | INTRAMUSCULAR | Status: AC
  Administered 2021-03-09: 12:00:00 4 mg via INTRAVENOUS
  Filled 2021-03-09: qty 2

## 2021-03-09 MED ORDER — SODIUM CHLORIDE 0.9 % IV BOLUS
1000.0000 mL | Freq: Once | INTRAVENOUS | Status: AC
  Administered 2021-03-09: 12:00:00 1000 mL via INTRAVENOUS

## 2021-03-09 MED ORDER — POTASSIUM CHLORIDE CRYS ER 20 MEQ PO TBCR: 20.0000 meq | EXTENDED_RELEASE_TABLET | Freq: Every day | ORAL | 0 refills | Status: DC

## 2021-03-09 NOTE — ED Provider Notes (Signed)
[facility] Emergency Department Provider Note   ____________________________________________   Event Date/Time   First MD Initiated Contact with Patient 03/09/21 [phone]     (approximate)  I have reviewed the triage vital signs and the nursing notes.   HISTORY  Chief Complaint Dysuria    HPI Katie Hampton is a 40 y.o. female presents to the ED with concerns for a urinary tract infection.  Patient states that she has urinary urgency, frequency and dysuria.  Patient states that she has a history of urinary tract infections.  She reports that she went to an urgent care near her home and was treated for a urinary tract infection with Macrodantin.  They called her several days later telling her that they lost her urine for the culture and sensitivity.  Patient has continued to take the Macrodantin without any improvement.  She rates her discomfort as 6 out of 10.        Past Medical History:  Diagnosis Date   Diabetes mellitus     There are no problems to display for this patient.   Past Surgical History:  Procedure Laterality Date   CHOLECYSTECTOMY      Prior to Admission medications   Medication Sig Start Date End Date Taking? Authorizing Provider  cefdinir (OMNICEF) 300 MG capsule Take 1 capsule (300 mg total) by mouth 2 (two) times daily. 03/09/21  Yes Bridget Hartshorn L, PA-C  ondansetron (ZOFRAN-ODT) 4 MG disintegrating tablet Take 1 tablet (4 mg total) by mouth every 8 (eight) hours as needed for nausea or vomiting. 03/09/21  Yes Bridget Hartshorn L, PA-C  potassium chloride SA (KLOR-CON M) 20 MEQ tablet Take 1 tablet (20 mEq total) by mouth daily. 03/09/21  Yes Nikesha Kwasny L, PA-C  Aspirin-Caffeine (BAYER BACK & BODY PO) Take 1 tablet by mouth daily as needed (pain).    [provider]  insulin NPH (HUMULIN N,NOVOLIN N) 100 UNIT/ML injection Inject 18-22 Units into the skin 2 (two) times daily before a meal. Per patients sliding  scale: breakfast - 20-22 units,  supper 18-20 units    [provider]  insulin regular (NOVOLIN R,HUMULIN R) 100 units/mL injection Inject 18-24 Units into the skin 2 (two) times daily before a meal. Per patients sliding scale: breakfast 20-24 units, supper 18-20    [provider]  LUTEIN PO Take 1 capsule by mouth 2 (two) times daily.    [provider]  Multiple Vitamin (MULTIVITAMIN WITH MINERALS) TABS Take 1 tablet by mouth daily.    [provider]  Multiple Vitamins-Minerals (AIRBORNE PO) Take 1 tablet by mouth daily.    [provider]  naproxen sodium (ALEVE) 220 MG tablet Take 880 mg by mouth 2 (two) times daily as needed (pain).    [provider]    Allergies Patient has no known allergies.  No family history on file.  Social History Social History   Tobacco Use   Smoking status: Light Smoker    Types: Cigarettes   Smokeless tobacco: Never  Substance Use Topics   Alcohol use: No    Review of Systems Constitutional: No fever/chills Eyes: No visual changes. ENT: No sore throat. Cardiovascular: Denies chest pain. Respiratory: Denies shortness of breath. Gastrointestinal: No abdominal pain.  Positive nausea, no vomiting.  No diarrhea.   Genitourinary: Positive dysuria/frequency/urgency. Musculoskeletal: Negative for back pain. Skin: Negative for rash. Neurological: Negative for headaches, focal weakness or numbness. ____________________________________________   PHYSICAL EXAM:  VITAL SIGNS: ED Triage  Vitals  Enc Vitals Group     BP 03/09/21 0819 (!) 142/81     Pulse Rate 03/09/21 0819 (!) 101     Resp 03/09/21 0819 17     Temp 03/09/21 0819 99.2 F (37.3 C)     Temp Source 03/09/21 0819 Oral     SpO2 03/09/21 0819 97 %     Weight 03/09/21 0759 215 lb (97.5 kg)     Height 03/09/21 0759 5\' 3"  (1.6 m)     Head Circumference --      Peak Flow --      Pain Score 03/09/21 0759 6     Pain Loc --      Pain  Edu? --      Excl. in [place]? --     Constitutional: Alert and oriented. Well appearing and in no acute distress. Eyes: Conjunctivae are normal.  Head: Atraumatic. Neck: No stridor.   Cardiovascular: Normal rate, regular rhythm. Grossly normal heart sounds.  Good peripheral circulation. Respiratory: Normal respiratory effort.  No retractions. Lungs CTAB. Gastrointestinal: Soft and nontender. No distention. No CVA tenderness. Musculoskeletal: Moves upper and lower extremities that any difficulty.  Normal gait was noted. Neurologic:  Normal speech and language. No gross focal neurologic deficits are appreciated. No gait instability. Skin:  Skin is warm, dry and intact. No rash noted. Psychiatric: Mood and affect are normal. Speech and behavior are normal.  ____________________________________________   LABS (all labs ordered are listed, but only abnormal results are displayed)  Labs Reviewed  URINALYSIS, COMPLETE (UACMP) WITH MICROSCOPIC - Abnormal; Notable for the following components:      Result Value   APPearance CLOUDY (*)    Hgb urine dipstick MODERATE (*)    Bilirubin Urine SMALL (*)    Ketones, ur TRACE (*)    Protein, ur >300 (*)    Nitrite POSITIVE (*)    Leukocytes,Ua LARGE (*)    RBC / HPF >50 (*)    WBC, UA >50 (*)    Bacteria, UA MANY (*)    All other components within normal limits  CBC WITH DIFFERENTIAL/PLATELET - Abnormal; Notable for the following components:   WBC 17.0 (*)    Platelets 425 (*)    Neutro Abs 14.0 (*)    Abs Immature Granulocytes 0.12 (*)    All other components within normal limits  BASIC METABOLIC PANEL - Abnormal; Notable for the following components:   Potassium 2.9 (*)    Glucose, Bld 113 (*)    Calcium 8.4 (*)    All other components within normal limits  POC URINE PREG, ED      PROCEDURES  Procedure(s) performed (including Critical Care):  Procedures   ____________________________________________   INITIAL IMPRESSION /  ASSESSMENT AND PLAN / ED COURSE  As part of my medical decision making, I reviewed the following data within the electronic MEDICAL RECORD NUMBER Notes from prior ED visits and [place] Controlled Substance Database  40 year old female presents to the ED with complaint of urinary frequency, urgency and dysuria.  Patient has history of urinary tract infections.  She currently was taking Macrodantin as prescribed by the urgent care.  She was called and told that they lost her urine and that the culture and sensitivity cannot be done.  Findings were consistent with a urinary tract infection along with hypokalemia.  Patient was given K-Dur while in the emergency department, Rocephin 1 g IV and Zofran 4 mg IV.  Patient was feeling much better and states  that she no longer feels nauseous.  She will discontinue taking the Macrodantin and a prescription for cefdinir 300 mg twice daily for 10 days was sent to her pharmacy along with potassium daily and Zofran every 8 hours as needed nausea.  Patient is aware that should she begin having fever, chills and worsening she is return to the emergency department where most likely she will need to be hospitalized as she has failed on outpatient medication.  Patient agrees with this plan as Christmas is 2 days away.  Looking through her past culture that was approximately 6 months ago she did have Klebsiella and was sensitive to the medication that she was discharged home.  ____________________________________________   FINAL CLINICAL IMPRESSION(S) / ED DIAGNOSES  Final diagnoses:  Urinary tract infection with hematuria, site unspecified  Hypokalemia     ED Discharge Orders          Ordered    ondansetron (ZOFRAN-ODT) 4 MG disintegrating tablet  Every 8 hours PRN        03/09/21 1341    potassium chloride SA (KLOR-CON M) 20 MEQ tablet  Daily        03/09/21 1341    cefdinir (OMNICEF) 300 MG capsule  2 times daily        03/09/21 1341             Note:  This  document was prepared using Dragon voice recognition software and may include unintentional dictation errors.    Johnn Hai, PA-C 03/09/21 1403    Blake Divine, MD 03/10/21 [phone]

## 2021-03-09 NOTE — ED Notes (Signed)
AAOx3.  Skin warm and dry no apparent distress.  C/O right lower back pain radiating to right buttock x 1 week.  States has had several kidney infections this year. Seen by Urgent care on Monday, unable to perform UA due to patient taking AZO-- urine sent out for culture, but specimen lost.   Patient was told to take antibiotics that she had left over at home from previous infection.  Also c/o burning with urination and fevers, tmax 101.6.

## 2021-03-09 NOTE — ED Triage Notes (Signed)
Pt reports think she has another UTI. Pt reports urinary urgency, frequency and dysuria. Pt states hx of UTI's and feels the same. Pt reports went to UC and they collected urine but then called her several days later and told her they lost her urine.

## 2021-03-09 NOTE — Discharge Instructions (Addendum)
Follow-up with your primary care provider if any continued problems or concerns.  Return to the emergency department if any severe worsening of your symptoms over the holiday weekend.  Continue with the antibiotic that was sent to your pharmacy and also a prescription for nausea was sent as well.  Increase fluids.  Also return to the emergency department should you develop any uncontrolled vomiting, fever or chills.  Also noted on your lab work was that your potassium was slightly low.  A tablet was given to you while in the emergency department along with a prescription for potassium to take at home.  Take the next tablet of potassium tomorrow as you had large dose while in the emergency department.

## 2021-08-06 IMAGING — CT CT RENAL STONE PROTOCOL
2 of 4 series · 17 of 46 positions shown, 19 images · non-contrast
Comparison: August 16, 2015

CLINICAL DATA: Back pain and dysuria.

EXAM:
CT ABDOMEN AND PELVIS WITHOUT CONTRAST
TECHNIQUE: Multidetector CT imaging of the abdomen and pelvis was performed
following the standard protocol without IV contrast.

[Series 2: stone full standard · axial · 0.91mm/px · z∈[-382,+38]mm · 14 of 92 slices shown, 16 images]
[im 4/92  soft-tissue]
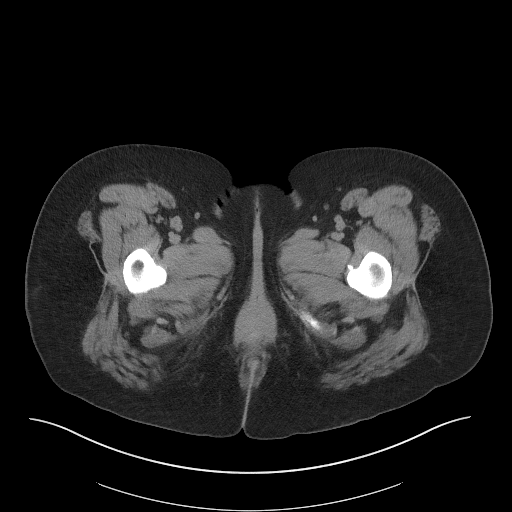
[im 4/92  bone]
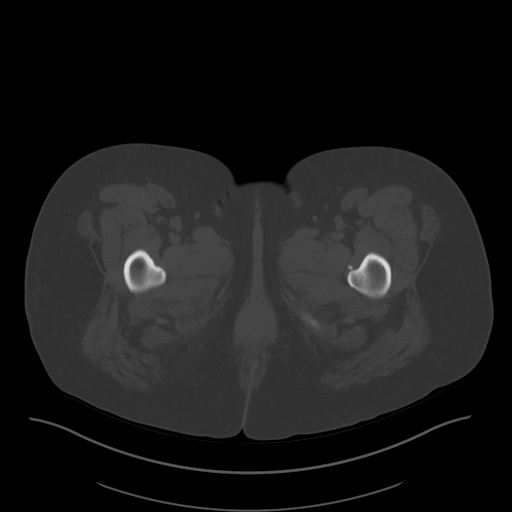
[im 11/92  soft-tissue]
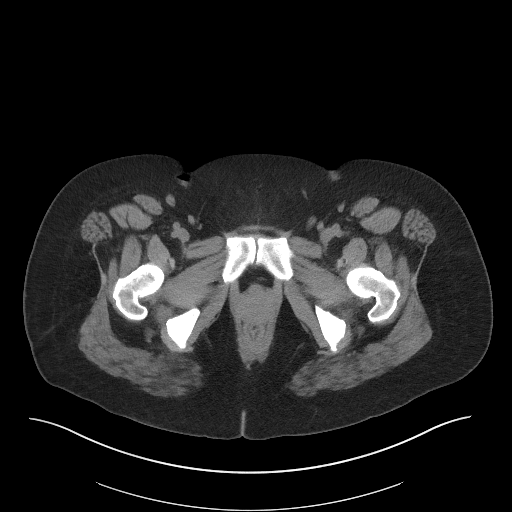
[im 19/92  soft-tissue]
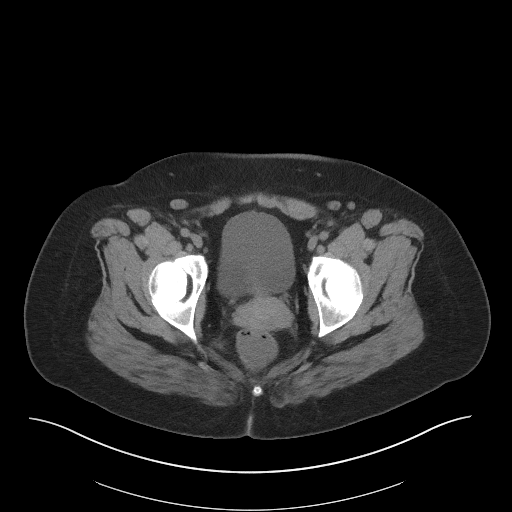
[im 26/92  soft-tissue]
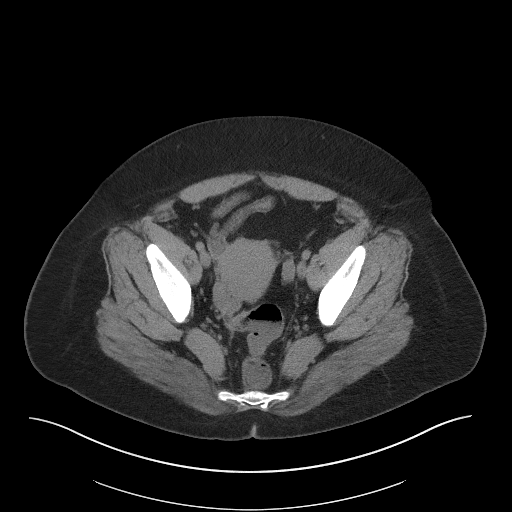
[im 30/92  soft-tissue]
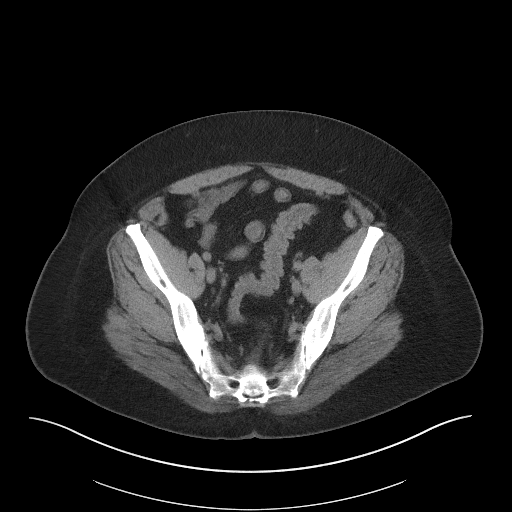
[im 37/92  soft-tissue]
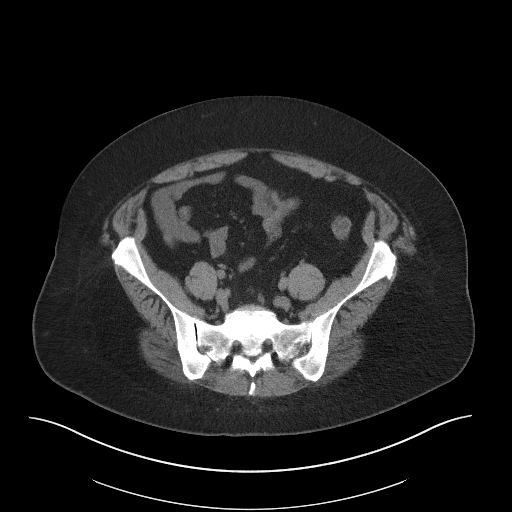
[im 44/92  soft-tissue]
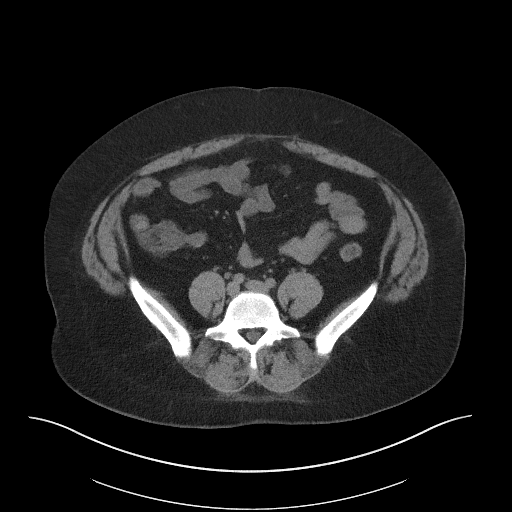
[im 48/92  soft-tissue]
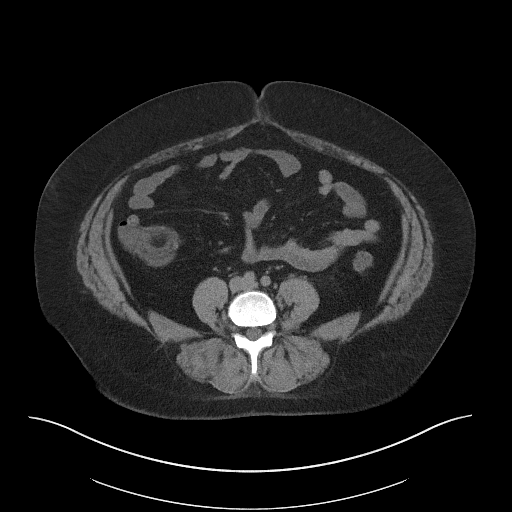
[im 55/92  soft-tissue]
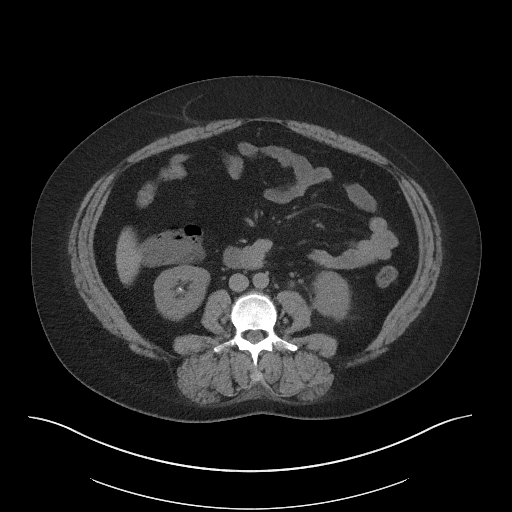
[im 55/92  bone]
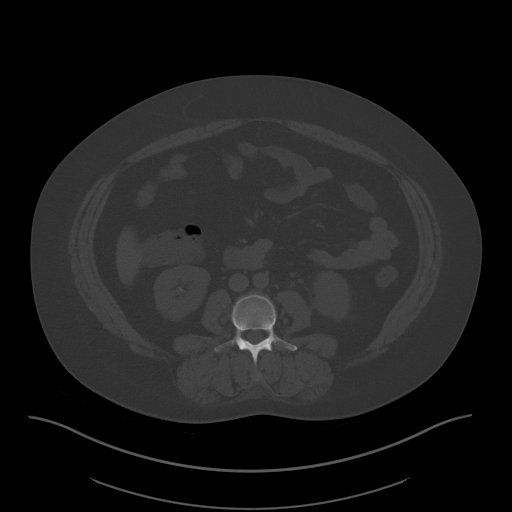
[im 62/92  soft-tissue]
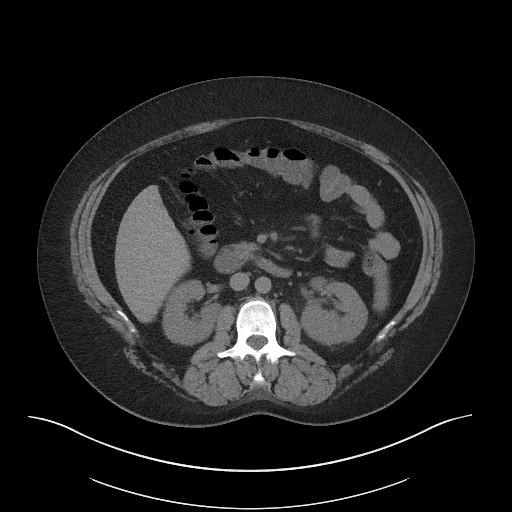
[im 70/92  soft-tissue]
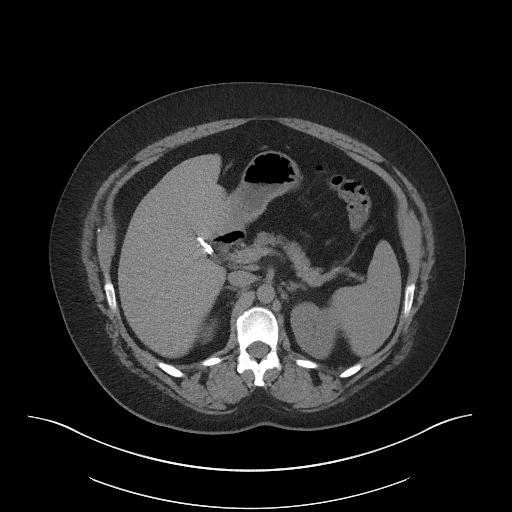
[im 73/92  soft-tissue]
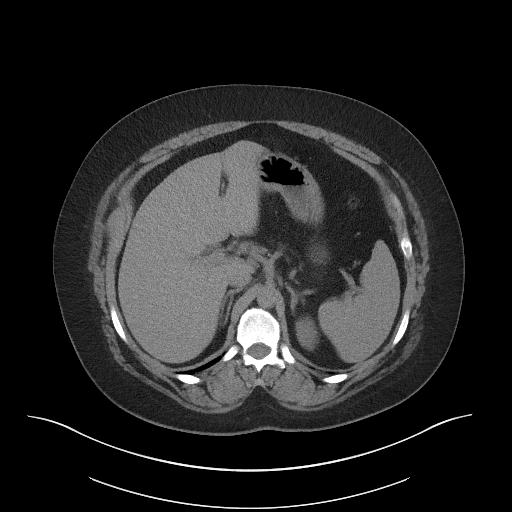
[im 81/92  soft-tissue]
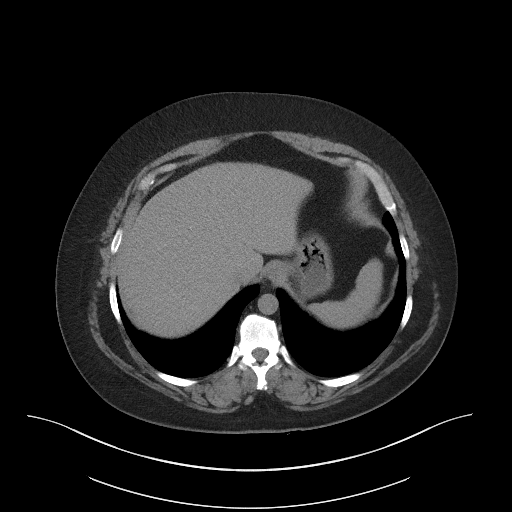
[im 88/92  soft-tissue]
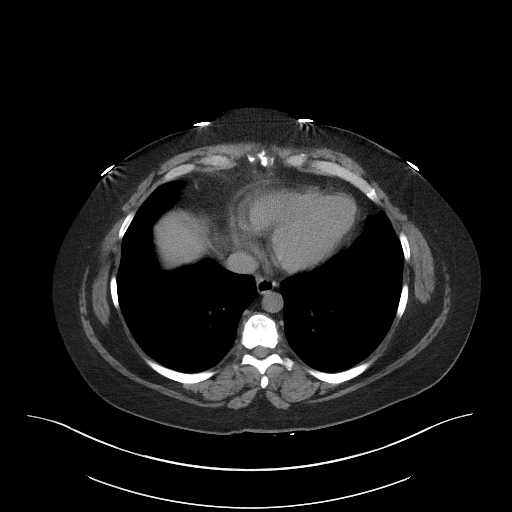

[Series 5: coronal · coronal · 0.84mm/px · 3 of 152 slices shown]
[im 51/152  soft-tissue]
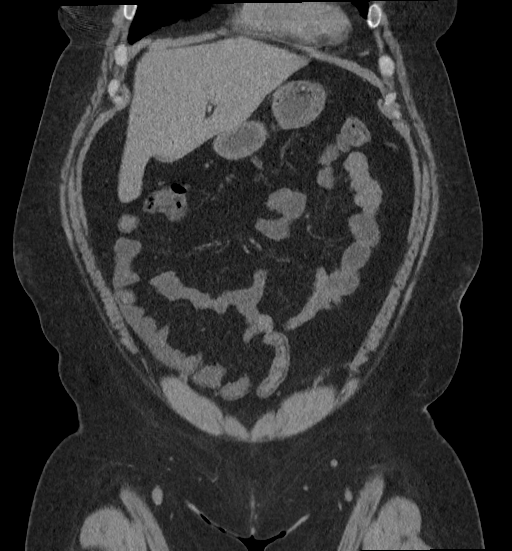
[im 68/152  soft-tissue]
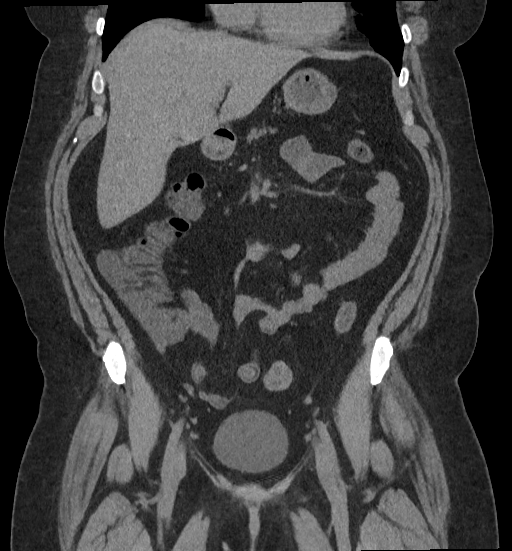
[im 84/152  soft-tissue]
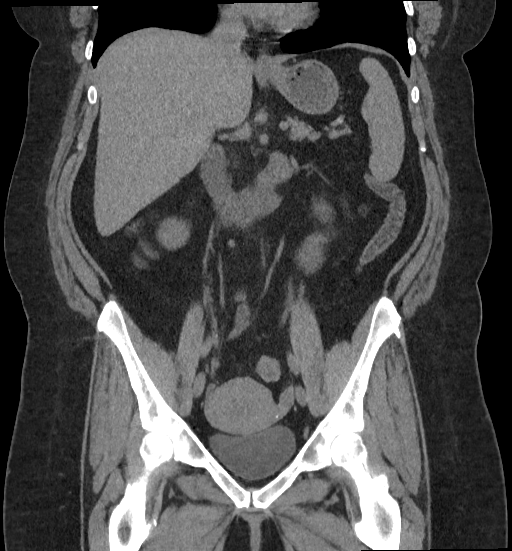

[17 of 46 positions shown; findings below may reference images not displayed]

FINDINGS: Lower chest: No acute abnormality.

Hepatobiliary: No focal liver abnormality is seen. Status post
cholecystectomy. No biliary dilatation.

Pancreas: Unremarkable. No pancreatic ductal dilatation or
surrounding inflammatory changes.

Spleen: Normal in size without focal abnormality.

Adrenals/Urinary Tract: Adrenal glands are unremarkable. Kidneys are
normal, without obstructing renal calculi, focal lesion, or
hydronephrosis. A 3 mm nonobstructing renal stone is seen within the
mid to lower right kidney. Very mild left-sided perinephric
inflammatory fat stranding is seen. Bladder is unremarkable.

Stomach/Bowel: Stomach is within normal limits. Appendix appears
normal. No evidence of bowel wall thickening, distention, or
inflammatory changes.

Vascular/Lymphatic: No significant vascular findings are present. No
enlarged abdominal or pelvic lymph nodes.

Reproductive: Uterus and bilateral adnexa are unremarkable.

Other: No abdominal wall hernia or abnormality. No abdominopelvic
ascites.

Musculoskeletal: No acute or significant osseous findings.
IMPRESSION: 1. 3 mm nonobstructing renal stone within the right kidney.
2. Very mild left perinephric inflammatory fat stranding which may
represent sequelae associated with acute pyelonephritis. Correlation
with urinalysis is recommended.
3. Evidence of prior cholecystectomy.

## 2022-03-29 ENCOUNTER — Emergency Department
Admission: EM | Admit: 2022-03-29 | Discharge: 2022-03-29 | Disposition: A | Discharge status: Home / Self Care | Payer: Medicare Other | Attending: Student in an Organized Health Care Education/Training Program | Admitting: Student in an Organized Health Care Education/Training Program

## 2022-03-29 ENCOUNTER — Other Ambulatory Visit: Payer: Self-pay

## 2022-03-29 DIAGNOSIS — R739 Hyperglycemia, unspecified: Secondary | ICD-10-CM | POA: Diagnosis present

## 2022-03-29 LAB — CBC
HCT: 41.5 % (ref 36.0–46.0)
Hemoglobin: 13.7 g/dL (ref 12.0–15.0)
MCH: 34.7 pg — ABNORMAL HIGH (ref 26.0–34.0)
MCHC: 33 g/dL (ref 30.0–36.0)
MCV: 105.1 fL — ABNORMAL HIGH (ref 80.0–100.0)
Platelets: 285 10*3/uL (ref 150–400)
RBC: 3.95 MIL/uL (ref 3.87–5.11)
RDW: 13.1 % (ref 11.5–15.5)
WBC: 13.2 10*3/uL — ABNORMAL HIGH (ref 4.0–10.5)
nRBC: 0 % (ref 0.0–0.2)

## 2022-03-29 LAB — BASIC METABOLIC PANEL
Anion gap: 14 (ref 5–15)
BUN: 11 mg/dL (ref 6–20)
CO2: 17 mmol/L — ABNORMAL LOW (ref 22–32)
Calcium: 8.3 mg/dL — ABNORMAL LOW (ref 8.9–10.3)
Chloride: 101 mmol/L (ref 98–111)
Creatinine, Ser: 1.11 mg/dL — ABNORMAL HIGH (ref 0.44–1.00)
GFR, Estimated: >60 mL/min (ref >60)
Glucose, Bld: 429 mg/dL — ABNORMAL HIGH (ref 70–99)
Potassium: 3.7 mmol/L (ref 3.5–5.1)
Sodium: 132 mmol/L — ABNORMAL LOW (ref 135–145)

## 2022-03-29 LAB — URINALYSIS, ROUTINE W REFLEX MICROSCOPIC
Bilirubin Urine: NEGATIVE
Glucose, UA: >=500 mg/dL — AB
Hgb urine dipstick: NEGATIVE
Ketones, ur: NEGATIVE mg/dL
Nitrite: NEGATIVE
Protein, ur: 30 mg/dL — AB
Specific Gravity, Urine: 1.016 (ref 1.005–1.030)
pH: 5 (ref 5.0–8.0)

## 2022-03-29 LAB — CBG MONITORING, ED
Glucose-Capillary: 388 mg/dL — ABNORMAL HIGH (ref 70–99)
Glucose-Capillary: 433 mg/dL — ABNORMAL HIGH (ref 70–99)
Glucose-Capillary: 291 mg/dL — ABNORMAL HIGH (ref 70–99)

## 2022-03-29 LAB — PREGNANCY, URINE: Preg Test, Ur: NEGATIVE

## 2022-03-29 MED ORDER — SODIUM CHLORIDE 0.9 % IV BOLUS
1000.0000 mL | Freq: Once | INTRAVENOUS | Status: AC
  Administered 2022-03-29: 1000 mL via INTRAVENOUS

## 2022-03-29 MED ORDER — INSULIN ASPART 100 UNIT/ML IJ SOLN: 6.0000 [IU] | Freq: Once | INTRAMUSCULAR | Status: DC

## 2022-03-29 MED ORDER — INSULIN ASPART 100 UNIT/ML IJ SOLN
10.0000 [IU] | Freq: Once | INTRAMUSCULAR | Status: AC
  Administered 2022-03-29: 10 [IU] via SUBCUTANEOUS
  Filled 2022-03-29: qty 1

## 2022-03-29 NOTE — ED Provider Notes (Signed)
[facility] Provider Note    Event Date/Time   First MD Initiated Contact with Patient 03/29/22 1815     (approximate)   History   Hyperglycemia   HPI  Katie Hampton is a 42 y.o. female presents to the ER for evaluation of generalized malaise concerned that her blood sugar was low covid 19 ago.  He does not recall if she may have missed a dose of insulin.  She is on sliding scale with long-acting nightly.  Denies any nausea vomiting no chest pain or shortness of breath.  No back pain.  No numbness or tingling no headaches.     Physical Exam   Triage Vital Signs: ED Triage Vitals  Enc Vitals Group     BP 03/29/22 1722 (!) 153/88     Pulse Rate 03/29/22 1722 (!) 128     Resp 03/29/22 1722 18     Temp 03/29/22 1722 98.6 F (37 C)     Temp src --      SpO2 03/29/22 1722 98 %     Weight --      Height --      Head Circumference --      Peak Flow --      Pain Score 03/29/22 1720 0     Pain Loc --      Pain Edu? --      Excl. in [place]? --     Most recent vital signs: Vitals:   03/29/22 1930 03/29/22 2000  BP: (!) 127/90   Pulse: (!) 106 (!) 105  Resp: 16   Temp:    SpO2: 99% 99%     Constitutional: Alert  Eyes: Conjunctivae are normal.  Head: Atraumatic. Nose: No congestion/rhinnorhea. Mouth/Throat: Mucous membranes are moist.   Neck: Painless ROM.  Cardiovascular:   Good peripheral circulation. Respiratory: Normal respiratory effort.  No retractions.  Gastrointestinal: Soft and nontender.  Musculoskeletal:  no deformity Neurologic:  MAE spontaneously. No gross focal neurologic deficits are appreciated.  Skin:  Skin is warm, dry and intact. No rash noted. Psychiatric: Mood and affect are normal. Speech and behavior are normal.    ED Results / Procedures / Treatments   Labs (all labs ordered are listed, but only abnormal results are displayed) Labs Reviewed  BASIC METABOLIC PANEL - Abnormal; Notable for the following  components:      Result Value   Sodium 132 (*)    CO2 17 (*)    Glucose, Bld 429 (*)    Creatinine, Ser 1.11 (*)    Calcium 8.3 (*)    All other components within normal limits  CBC - Abnormal; Notable for the following components:   WBC 13.2 (*)    MCV 105.1 (*)    MCH 34.7 (*)    All other components within normal limits  URINALYSIS, ROUTINE W REFLEX MICROSCOPIC - Abnormal; Notable for the following components:   Color, Urine STRAW (*)    APPearance HAZY (*)    Glucose, UA >=500 (*)    Protein, ur 30 (*)    Leukocytes,Ua SMALL (*)    Bacteria, UA RARE (*)    All other components within normal limits  CBG MONITORING, ED - Abnormal; Notable for the following components:   Glucose-Capillary 388 (*)    All other components within normal limits  CBG MONITORING, ED - Abnormal; Notable for the following components:   Glucose-Capillary 433 (*)    All other components within normal limits  CBG  MONITORING, ED - Abnormal; Notable for the following components:   Glucose-Capillary 291 (*)    All other components within normal limits  PREGNANCY, URINE     EKG  ED ECG REPORT I, Merlyn Lot, the attending physician, personally viewed and interpreted this ECG.   Date: 03/29/2022  EKG Time: 18:24  Rate: 120  Rhythm: sinus  Axis: normal  Intervals: normal  ST&T Change: no stemi, no depressions    RADIOLOGY    PROCEDURES:  Critical Care performed:   Procedures   MEDICATIONS ORDERED IN ED: Medications  insulin aspart (novoLOG) injection 6 Units (has no administration in time range)  sodium chloride 0.9 % bolus 1,000 mL (0 mLs Intravenous Stopped 03/29/22 2033)  insulin aspart (novoLOG) injection 10 Units (10 Units Subcutaneous Given 03/29/22 1827)     IMPRESSION / MDM / ASSESSMENT AND PLAN / ED COURSE  I reviewed the triage vital signs and the nursing notes.                              Differential diagnosis includes, but is not limited to, hyperglycemia,  dehydration, electrolyte abnormality, DKA, HHS, sepsis  Patient presenting to the ER for evaluation of symptoms as described above.  Based on symptoms, risk factors and considered above differential, this presenting complaint could reflect a potentially life-threatening illness therefore the patient will be placed on continuous pulse oximetry and telemetry for monitoring.  Laboratory evaluation will be sent to evaluate for the above complaints.  She is clinically very well-appearing.  Mildly tachycardic.  Hyperglycemic anion gap.  Will give IV fluids as well as insulin.   Clinical Course as of 03/29/22 2033  Fri Mar 29, 2022  2031 Patient blood sugar has improved.  Patient not being here for DKA.  No gap and no ketones.  Feels significantly improved after IV fluids.  Patient did not receive the initial order for 16 units only received 10 units.  Patient says that she feels well is requesting discharge home.  She has her home insulin and sliding scale as well as glucose monitor at home and feels comfortable managing this there. [PR]    Clinical Course User Index [PR] Merlyn Lot, MD    FINAL CLINICAL IMPRESSION(S) / ED DIAGNOSES   Final diagnoses:  Hyperglycemia     Rx / DC Orders   ED Discharge Orders     None        Note:  This document was prepared using Dragon voice recognition software and may include unintentional dictation errors.    Merlyn Lot, MD 03/29/22 2034

## 2022-03-29 NOTE — ED Triage Notes (Signed)
Pt from home via EMS for c/o hyperglycemia, BGL was originally 416 for EMS. Pt was given 500 ml's IVF en route.  Pt states she felt like her blood sugar was low and a few sugary snacks prior to EMS arriving. Pt states she "doesn't feel right." Pt is AOX4, NAD noted. Pt has history A-fibb and DM type 1.

## 2022-05-17 ENCOUNTER — Encounter: Payer: Self-pay | Admitting: Radiology

## 2022-05-17 ENCOUNTER — Other Ambulatory Visit: Payer: Self-pay

## 2022-05-17 ENCOUNTER — Emergency Department
Admission: EM | Admit: 2022-05-17 | Discharge: 2022-05-17 | Disposition: A | Discharge status: Home / Self Care | Payer: Medicare Other | Attending: Student in an Organized Health Care Education/Training Program | Admitting: Student in an Organized Health Care Education/Training Program

## 2022-05-17 ENCOUNTER — Emergency Department: Payer: Medicare Other

## 2022-05-17 DIAGNOSIS — N12 Tubulo-interstitial nephritis, not specified as acute or chronic: Secondary | ICD-10-CM | POA: Insufficient documentation

## 2022-05-17 DIAGNOSIS — Z7982 Long term (current) use of aspirin: Secondary | ICD-10-CM | POA: Insufficient documentation

## 2022-05-17 DIAGNOSIS — R509 Fever, unspecified: Secondary | ICD-10-CM | POA: Diagnosis present

## 2022-05-17 DIAGNOSIS — Z794 Long term (current) use of insulin: Secondary | ICD-10-CM | POA: Insufficient documentation

## 2022-05-17 DIAGNOSIS — E119 Type 2 diabetes mellitus without complications: Secondary | ICD-10-CM | POA: Insufficient documentation

## 2022-05-17 HISTORY — DX: Tubulo-interstitial nephritis, not specified as acute or chronic: N12

## 2022-05-17 LAB — URINALYSIS, W/ REFLEX TO CULTURE (INFECTION SUSPECTED)
Bacteria, UA: NONE SEEN
Bilirubin Urine: NEGATIVE
Glucose, UA: >=500 mg/dL — AB
Hgb urine dipstick: NEGATIVE
Ketones, ur: NEGATIVE mg/dL
Leukocytes,Ua: NEGATIVE
Nitrite: NEGATIVE
Protein, ur: 30 mg/dL — AB
Specific Gravity, Urine: 1.03 (ref 1.005–1.030)
pH: 6 (ref 5.0–8.0)

## 2022-05-17 LAB — CBC
HCT: 35.4 % — ABNORMAL LOW (ref 36.0–46.0)
Hemoglobin: 11.7 g/dL — ABNORMAL LOW (ref 12.0–15.0)
MCH: 31.1 pg (ref 26.0–34.0)
MCHC: 33.1 g/dL (ref 30.0–36.0)
MCV: 94.1 fL (ref 80.0–100.0)
Platelets: 312 10*3/uL (ref 150–400)
RBC: 3.76 MIL/uL — ABNORMAL LOW (ref 3.87–5.11)
RDW: 12.8 % (ref 11.5–15.5)
WBC: 10.7 10*3/uL — ABNORMAL HIGH (ref 4.0–10.5)
nRBC: 0 % (ref 0.0–0.2)

## 2022-05-17 LAB — URINALYSIS, ROUTINE W REFLEX MICROSCOPIC
Bacteria, UA: NONE SEEN
Bilirubin Urine: NEGATIVE
Glucose, UA: >=500 mg/dL — AB
Hgb urine dipstick: NEGATIVE
Ketones, ur: NEGATIVE mg/dL
Nitrite: NEGATIVE
Protein, ur: 30 mg/dL — AB
Specific Gravity, Urine: 1.029 (ref 1.005–1.030)
pH: 6 (ref 5.0–8.0)

## 2022-05-17 LAB — BASIC METABOLIC PANEL
Anion gap: 11 (ref 5–15)
BUN: 12 mg/dL (ref 6–20)
CO2: 20 mmol/L — ABNORMAL LOW (ref 22–32)
Calcium: 8.2 mg/dL — ABNORMAL LOW (ref 8.9–10.3)
Chloride: 101 mmol/L (ref 98–111)
Creatinine, Ser: 0.93 mg/dL (ref 0.44–1.00)
GFR, Estimated: >60 mL/min (ref >60)
Glucose, Bld: 403 mg/dL — ABNORMAL HIGH (ref 70–99)
Potassium: 3.9 mmol/L (ref 3.5–5.1)
Sodium: 132 mmol/L — ABNORMAL LOW (ref 135–145)

## 2022-05-17 LAB — CBG MONITORING, ED: Glucose-Capillary: 393 mg/dL — ABNORMAL HIGH (ref 70–99)

## 2022-05-17 LAB — PREGNANCY, URINE: Preg Test, Ur: NEGATIVE

## 2022-05-17 MED ORDER — SULFAMETHOXAZOLE-TRIMETHOPRIM 800-160 MG PO TABS
1.0000 | ORAL_TABLET | Freq: Once | ORAL | Status: AC
  Administered 2022-05-17: 1 via ORAL
  Filled 2022-05-17: qty 1

## 2022-05-17 MED ORDER — ONDANSETRON 4 MG PO TBDP
4.0000 mg | ORAL_TABLET | Freq: Once | ORAL | Status: AC
  Administered 2022-05-17: 4 mg via ORAL
  Filled 2022-05-17: qty 1

## 2022-05-17 MED ORDER — SODIUM CHLORIDE 0.9 % IV BOLUS
1000.0000 mL | Freq: Once | INTRAVENOUS | Status: AC
Start: 2022-05-17 — End: 2022-05-17
  Administered 2022-05-17: 1000 mL via INTRAVENOUS

## 2022-05-17 MED ORDER — HYDROCODONE-ACETAMINOPHEN 5-325 MG PO TABS
1.0000 | ORAL_TABLET | ORAL | Status: AC
  Administered 2022-05-17: 1 via ORAL
  Filled 2022-05-17: qty 1

## 2022-05-17 MED ORDER — ONDANSETRON 4 MG PO TBDP: 4.0000 mg | ORAL_TABLET | Freq: Three times a day (TID) | ORAL | 0 refills | Status: DC | PRN

## 2022-05-17 MED ORDER — HYDROCODONE-ACETAMINOPHEN 5-325 MG PO TABS: 1.0000 | ORAL_TABLET | ORAL | 0 refills | Status: DC | PRN

## 2022-05-17 MED ORDER — SODIUM CHLORIDE 0.9 % IV SOLN
1.0000 g | Freq: Once | INTRAVENOUS | Status: AC
  Administered 2022-05-17: 1 g via INTRAVENOUS
  Filled 2022-05-17: qty 10

## 2022-05-17 MED ORDER — SULFAMETHOXAZOLE-TRIMETHOPRIM 800-160 MG PO TABS: 1.0000 | ORAL_TABLET | Freq: Two times a day (BID) | ORAL | 0 refills | Status: DC

## 2022-05-17 NOTE — ED Triage Notes (Addendum)
Pt states she was diagnosed on Wednesday with pyelonephritis. Pt was placed on Cipro on Wednesday with no symptom improvement. Pt complaining of back pain and uncontrolled blood sugars. Pt states that all this started on Sunday.Pt states fevers have been as high as 103.

## 2022-05-17 NOTE — ED Notes (Addendum)
RN to bedside to introduce self to pt. Pt resting and in no acute distress. Pt advised her BGL at home has been running high the last several days since she was seen by her PCP on Wednesday.

## 2022-05-17 NOTE — Discharge Instructions (Signed)
Please take antibiotics as prescribed.  Make sure you are drinking lots of fluids.  Return to the ER for any fevers increasing back pain vomiting worsening symptoms or any urgent changes in your health

## 2022-05-17 NOTE — ED Provider Notes (Signed)
[facility] Provider Note   CSN: [id number] Arrival date & time: 05/17/22  1727     History  Chief Complaint  Patient presents with   Back Pain   Fever    Katie Hampton is a 42 y.o. female.  With history of diabetes, frequent UTIs/pyelonephritis and history of kidney stones presents to the emergency department for evaluation of right-sided flank pain, dysuria and increase in urinary frequency.  5 days ago she developed symptoms of dysuria and increased urinary frequency.  She was seen a few days later and a medical facility in [facility], was diagnosed with UTI and placed on Cipro.  Patient states she has not seen any improvement over the last couple days while being on the antibiotics.  She has had subjective fevers, no nausea or vomiting but continues to have right-sided flank pain and urinary discomfort.  She denies any vaginal discharge.  No vomiting.  Tolerating p.o. well.  Symptoms are improved with ibuprofen and Tylenol.  Previous urine cultures reviewed. HPI     Home Medications Prior to Admission medications   Medication Sig Start Date End Date Taking? Authorizing Provider  HYDROcodone-acetaminophen (NORCO) 5-325 MG tablet Take 1 tablet by mouth every 4 (four) hours as needed for moderate pain. 05/17/22  Yes Duanne Guess, PA-C  ondansetron (ZOFRAN-ODT) 4 MG disintegrating tablet Take 1 tablet (4 mg total) by mouth every 8 (eight) hours as needed for nausea or vomiting. 05/17/22  Yes Duanne Guess, PA-C  sulfamethoxazole-trimethoprim (BACTRIM DS) 800-160 MG tablet Take 1 tablet by mouth 2 (two) times daily for 10 days. 05/17/22 05/27/22 Yes Duanne Guess, PA-C  Aspirin-Caffeine (BAYER BACK & BODY PO) Take 1 tablet by mouth daily as needed (pain).    [provider]  cefdinir (OMNICEF) 300 MG capsule Take 1 capsule (300 mg total) by mouth 2 (two) times daily. 03/09/21   Johnn Hai, PA-C  insulin NPH (HUMULIN  N,NOVOLIN N) 100 UNIT/ML injection Inject 18-22 Units into the skin 2 (two) times daily before a meal. Per patients sliding scale: breakfast - 20-22 units,  supper 18-20 units    [provider]  insulin regular (NOVOLIN R,HUMULIN R) 100 units/mL injection Inject 18-24 Units into the skin 2 (two) times daily before a meal. Per patients sliding scale: breakfast 20-24 units, supper 18-20    [provider]  LUTEIN PO Take 1 capsule by mouth 2 (two) times daily.    [provider]  Multiple Vitamin (MULTIVITAMIN WITH MINERALS) TABS Take 1 tablet by mouth daily.    [provider]  Multiple Vitamins-Minerals (AIRBORNE PO) Take 1 tablet by mouth daily.    [provider]  naproxen sodium (ALEVE) 220 MG tablet Take 880 mg by mouth 2 (two) times daily as needed (pain).    [provider]  potassium chloride SA (KLOR-CON M) 20 MEQ tablet Take 1 tablet (20 mEq total) by mouth daily. 03/09/21   Johnn Hai, PA-C      Allergies    Patient has no known allergies.    Review of Systems   Review of Systems  Physical Exam Updated Vital Signs BP 118/73   Pulse 88   Temp 98.1 F (36.7 C) (Oral)   Resp 16   Ht '5\' 3"'$  (1.6 m)   Wt 100.7 kg   SpO2 98%   BMI 39.33 kg/m  Physical Exam Constitutional:      Appearance: She is well-developed.  HENT:  Head: Normocephalic and atraumatic.     Mouth/Throat:     Pharynx: No posterior oropharyngeal erythema.  Eyes:     Conjunctiva/sclera: Conjunctivae normal.  Cardiovascular:     Rate and Rhythm: Normal rate.  Pulmonary:     Effort: Pulmonary effort is normal. No respiratory distress.  Abdominal:     General: Bowel sounds are normal. There is no distension.     Tenderness: There is no abdominal tenderness. There is right CVA tenderness. There is no left CVA tenderness or guarding.  Musculoskeletal:        General: Normal range of motion.     Cervical back: Normal range of motion.  Skin:     General: Skin is warm.     Findings: No rash.  Neurological:     General: No focal deficit present.     Mental Status: She is alert and oriented to person, place, and time.     Cranial Nerves: No cranial nerve deficit.     Motor: No weakness.     Gait: Gait normal.  Psychiatric:        Behavior: Behavior normal.        Thought Content: Thought content normal.     ED Results / Procedures / Treatments   Labs (all labs ordered are listed, but only abnormal results are displayed) Labs Reviewed  URINALYSIS, ROUTINE W REFLEX MICROSCOPIC - Abnormal; Notable for the following components:      Result Value   Color, Urine YELLOW (*)    APPearance CLEAR (*)    Glucose, UA >=500 (*)    Protein, ur 30 (*)    Leukocytes,Ua TRACE (*)    All other components within normal limits  BASIC METABOLIC PANEL - Abnormal; Notable for the following components:   Sodium 132 (*)    CO2 20 (*)    Glucose, Bld 403 (*)    Calcium 8.2 (*)    All other components within normal limits  CBC - Abnormal; Notable for the following components:   WBC 10.7 (*)    RBC 3.76 (*)    Hemoglobin 11.7 (*)    HCT 35.4 (*)    All other components within normal limits  URINALYSIS, W/ REFLEX TO CULTURE (INFECTION SUSPECTED) - Abnormal; Notable for the following components:   Color, Urine YELLOW (*)    APPearance CLEAR (*)    Glucose, UA >=500 (*)    Protein, ur 30 (*)    All other components within normal limits  CBG MONITORING, ED - Abnormal; Notable for the following components:   Glucose-Capillary 393 (*)    All other components within normal limits  URINE CULTURE  PREGNANCY, URINE  CBG MONITORING, ED    EKG None  Radiology CT Renal Stone Study  Result Date: 05/17/2022 CLINICAL DATA:  Flank pain.  Recent diagnosis of pyelonephritis EXAM: CT ABDOMEN AND PELVIS WITHOUT CONTRAST TECHNIQUE: Multidetector CT imaging of the abdomen and pelvis was performed following the standard protocol without IV contrast.  RADIATION DOSE REDUCTION: This exam was performed according to the departmental dose-optimization program which includes automated exposure control, adjustment of the mA and/or kV according to patient size and/or use of iterative reconstruction technique. COMPARISON:  Renal stone CT 08/23/2020 and older FINDINGS: Lower chest: Bandlike opacities along the bases. Favor atelectasis. Trace right-sided pleural fluid. Hepatobiliary: On this non IV contrast exam the liver is grossly preserved. Previous cholecystectomy. Pancreas: Global mild pancreatic atrophy without obvious mass or ductal dilatation. Appearance is similar to prior.  Spleen: Spleen measures 12.9 cm in cephalocaudal length, borderline enlarged. Adrenals/Urinary Tract: Adrenal glands are preserved. No abnormal calcifications seen within the left kidney nor along the course of the left ureter. No left-sided renal collecting system dilatation. Preserved contours of the urinary bladder There is enlargement of the right kidney with stranding and slightly nodular contour thickening along the lateral aspect with heterogeneity of the parenchyma. This could go along with patient's provided history of pyelonephritis. There is a nonobstructing 3 mm lower pole right-sided renal stone without collecting system dilatation. Stomach/Bowel: On this non oral contrast exam, the large bowel has a normal course and caliber with scattered stool. Normal appendix. Mild debris in the stomach. Small bowel is nondilated. Vascular/Lymphatic: Normal caliber aorta and IVC with atherosclerotic change. No specific abnormal lymph node enlargement identified in the abdomen and pelvis. Reproductive: Uterus and bilateral adnexa are unremarkable. Other: Mild anasarca. No ascites. Small fat containing umbilical hernia Musculoskeletal: No acute or significant osseous findings. IMPRESSION: 3 mm nonobstructing lower pole right-sided renal stone. No ureteral stones. There is significant perinephric  stranding on the right with enlargement of the right kidney and heterogeneous appearance of the renal parenchyma. The changes could go along with the patient's provided history of pyelonephritis. If there is concern of the sequela of pyelonephritis including abscess or phlegmonous formation, an IV contrast study may be of some benefit for higher sensitivity. Borderline enlarged spleen Electronically Signed   By: Jill Side M.D.   On: 05/17/2022 19:46    Procedures Procedures    Medications Ordered in ED Medications  sulfamethoxazole-trimethoprim (BACTRIM DS) 800-160 MG per tablet 1 tablet (has no administration in time range)  HYDROcodone-acetaminophen (NORCO/VICODIN) 5-325 MG per tablet 1 tablet (has no administration in time range)  ondansetron (ZOFRAN-ODT) disintegrating tablet 4 mg (has no administration in time range)  sodium chloride 0.9 % bolus 1,000 mL (0 mLs Intravenous Stopped 05/17/22 1917)  cefTRIAXone (ROCEPHIN) 1 g in sodium chloride 0.9 % 100 mL IVPB (0 g Intravenous Stopped 05/17/22 1857)    ED Course/ Medical Decision Making/ A&P                             Medical Decision Making Amount and/or Complexity of Data Reviewed Labs: ordered. Radiology: ordered.  Risk Prescription drug management.   42 year old female with pyelonephritis.  She has been on Cipro for 2 days but continued to have a bit of back pain so she came into the ER for recheck.  She is afebrile, initially little tachycardic but with 1 L of fluids vital signs are stable, afebrile and nontachycardic.  She appears well and comfortable with no nausea or vomiting.  She is given 1 dose of ceftriaxone.  A CT scan obtained showing a renal stone but no ureteral stones.  There is perinephric stranding consistent with pyelonephritis.  Urinalysis consistent with UTI and cultures are pending.  White count slightly elevated at 10.7 and the remainder of blood work within normal limits.  She is discontinued on Cipro and  started on Bactrim DS for 10 days.  She is given strict return precautions. Final Clinical Impression(s) / ED Diagnoses Final diagnoses:  Pyelonephritis    Rx / DC Orders ED Discharge Orders          Ordered    HYDROcodone-acetaminophen (NORCO) 5-325 MG tablet  Every 4 hours PRN        05/17/22 2027    sulfamethoxazole-trimethoprim (BACTRIM DS) 800-160 MG  tablet  2 times daily        05/17/22 2027    ondansetron (ZOFRAN-ODT) 4 MG disintegrating tablet  Every 8 hours PRN        05/17/22 2027              Renata Caprice 05/17/22 2030    Merlyn Lot, MD 05/19/22 [phone]

## 2022-05-17 NOTE — ED Notes (Signed)
Called lab to ask about Urine preg. They will run it now.

## 2022-05-19 LAB — URINE CULTURE: Culture: NO GROWTH

## 2022-05-24 ENCOUNTER — Other Ambulatory Visit: Payer: Self-pay

## 2022-05-24 ENCOUNTER — Inpatient Hospital Stay
Admission: EM | Admit: 2022-05-24 | Discharge: 2022-05-31 | DRG: 871 | Disposition: A | Discharge status: Home / Self Care | Payer: Medicare Other | Attending: Internal Medicine | Admitting: Internal Medicine

## 2022-05-24 ENCOUNTER — Emergency Department: Payer: Medicare Other

## 2022-05-24 ENCOUNTER — Encounter: Payer: Self-pay | Admitting: Intensive Care

## 2022-05-24 DIAGNOSIS — E1042 Type 1 diabetes mellitus with diabetic polyneuropathy: Secondary | ICD-10-CM | POA: Diagnosis present

## 2022-05-24 DIAGNOSIS — I4819 Other persistent atrial fibrillation: Secondary | ICD-10-CM | POA: Diagnosis present

## 2022-05-24 DIAGNOSIS — E669 Obesity, unspecified: Secondary | ICD-10-CM | POA: Diagnosis present

## 2022-05-24 DIAGNOSIS — I4892 Unspecified atrial flutter: Secondary | ICD-10-CM

## 2022-05-24 DIAGNOSIS — I4430 Unspecified atrioventricular block: Secondary | ICD-10-CM | POA: Diagnosis present

## 2022-05-24 DIAGNOSIS — I480 Paroxysmal atrial fibrillation: Secondary | ICD-10-CM | POA: Diagnosis not present

## 2022-05-24 DIAGNOSIS — Z794 Long term (current) use of insulin: Secondary | ICD-10-CM

## 2022-05-24 DIAGNOSIS — I4891 Unspecified atrial fibrillation: Secondary | ICD-10-CM | POA: Diagnosis present

## 2022-05-24 DIAGNOSIS — A419 Sepsis, unspecified organism: Secondary | ICD-10-CM | POA: Diagnosis not present

## 2022-05-24 DIAGNOSIS — N10 Acute tubulo-interstitial nephritis: Secondary | ICD-10-CM | POA: Diagnosis present

## 2022-05-24 DIAGNOSIS — E119 Type 2 diabetes mellitus without complications: Secondary | ICD-10-CM

## 2022-05-24 DIAGNOSIS — Z87891 Personal history of nicotine dependence: Secondary | ICD-10-CM | POA: Diagnosis not present

## 2022-05-24 DIAGNOSIS — I110 Hypertensive heart disease with heart failure: Secondary | ICD-10-CM | POA: Diagnosis present

## 2022-05-24 DIAGNOSIS — Z1152 Encounter for screening for COVID-19: Secondary | ICD-10-CM

## 2022-05-24 DIAGNOSIS — N151 Renal and perinephric abscess: Secondary | ICD-10-CM | POA: Diagnosis present

## 2022-05-24 DIAGNOSIS — Z841 Family history of disorders of kidney and ureter: Secondary | ICD-10-CM | POA: Diagnosis not present

## 2022-05-24 DIAGNOSIS — A4150 Gram-negative sepsis, unspecified: Secondary | ICD-10-CM | POA: Diagnosis present

## 2022-05-24 DIAGNOSIS — E785 Hyperlipidemia, unspecified: Secondary | ICD-10-CM | POA: Diagnosis present

## 2022-05-24 DIAGNOSIS — Z79899 Other long term (current) drug therapy: Secondary | ICD-10-CM | POA: Diagnosis not present

## 2022-05-24 DIAGNOSIS — E1065 Type 1 diabetes mellitus with hyperglycemia: Secondary | ICD-10-CM | POA: Diagnosis present

## 2022-05-24 DIAGNOSIS — H409 Unspecified glaucoma: Secondary | ICD-10-CM | POA: Diagnosis present

## 2022-05-24 DIAGNOSIS — R6520 Severe sepsis without septic shock: Secondary | ICD-10-CM | POA: Diagnosis present

## 2022-05-24 DIAGNOSIS — I483 Typical atrial flutter: Secondary | ICD-10-CM | POA: Diagnosis not present

## 2022-05-24 DIAGNOSIS — G629 Polyneuropathy, unspecified: Secondary | ICD-10-CM

## 2022-05-24 DIAGNOSIS — Z6839 Body mass index (BMI) 39.0-39.9, adult: Secondary | ICD-10-CM

## 2022-05-24 DIAGNOSIS — Z800 Family history of malignant neoplasm of digestive organs: Secondary | ICD-10-CM

## 2022-05-24 DIAGNOSIS — E1040 Type 1 diabetes mellitus with diabetic neuropathy, unspecified: Secondary | ICD-10-CM | POA: Diagnosis not present

## 2022-05-24 LAB — URINALYSIS, ROUTINE W REFLEX MICROSCOPIC
Bilirubin Urine: NEGATIVE
Glucose, UA: NEGATIVE mg/dL
Hgb urine dipstick: NEGATIVE
Ketones, ur: NEGATIVE mg/dL
Leukocytes,Ua: NEGATIVE
Nitrite: NEGATIVE
Protein, ur: NEGATIVE mg/dL
Specific Gravity, Urine: 1.005 (ref 1.005–1.030)
pH: 7 (ref 5.0–8.0)

## 2022-05-24 LAB — COMPREHENSIVE METABOLIC PANEL
ALT: 17 U/L (ref 0–44)
AST: 23 U/L (ref 15–41)
Albumin: 2.6 g/dL — ABNORMAL LOW (ref 3.5–5.0)
Alkaline Phosphatase: 231 U/L — ABNORMAL HIGH (ref 38–126)
Anion gap: 12 (ref 5–15)
BUN: 7 mg/dL (ref 6–20)
CO2: 23 mmol/L (ref 22–32)
Calcium: 8.5 mg/dL — ABNORMAL LOW (ref 8.9–10.3)
Chloride: 97 mmol/L — ABNORMAL LOW (ref 98–111)
Creatinine, Ser: 0.82 mg/dL (ref 0.44–1.00)
GFR, Estimated: >60 mL/min (ref >60)
Glucose, Bld: 234 mg/dL — ABNORMAL HIGH (ref 70–99)
Potassium: 3.9 mmol/L (ref 3.5–5.1)
Sodium: 132 mmol/L — ABNORMAL LOW (ref 135–145)
Total Bilirubin: 0.5 mg/dL (ref 0.3–1.2)
Total Protein: 7.7 g/dL (ref 6.5–8.1)

## 2022-05-24 LAB — TROPONIN I (HIGH SENSITIVITY)
Troponin I (High Sensitivity): 5 ng/L (ref <18)
Troponin I (High Sensitivity): 5 ng/L (ref <18)

## 2022-05-24 LAB — LACTIC ACID, PLASMA
Lactic Acid, Venous: 3.1 mmol/L (ref 0.5–1.9)
Lactic Acid, Venous: 1.5 mmol/L (ref 0.5–1.9)
Lactic Acid, Venous: 1.9 mmol/L (ref 0.5–1.9)
Lactic Acid, Venous: 1.1 mmol/L (ref 0.5–1.9)

## 2022-05-24 LAB — CBC WITH DIFFERENTIAL/PLATELET
Abs Immature Granulocytes: 0.15 10*3/uL — ABNORMAL HIGH (ref 0.00–0.07)
Basophils Absolute: 0 10*3/uL (ref 0.0–0.1)
Basophils Relative: 0 %
Eosinophils Absolute: 0.1 10*3/uL (ref 0.0–0.5)
Eosinophils Relative: 1 %
HCT: 35.3 % — ABNORMAL LOW (ref 36.0–46.0)
Hemoglobin: 11.2 g/dL — ABNORMAL LOW (ref 12.0–15.0)
Immature Granulocytes: 1 %
Lymphocytes Relative: 11 %
Lymphs Abs: 2 10*3/uL (ref 0.7–4.0)
MCH: 30.3 pg (ref 26.0–34.0)
MCHC: 31.7 g/dL (ref 30.0–36.0)
MCV: 95.4 fL (ref 80.0–100.0)
Monocytes Absolute: 1 10*3/uL (ref 0.1–1.0)
Monocytes Relative: 5 %
Neutro Abs: 15.5 10*3/uL — ABNORMAL HIGH (ref 1.7–7.7)
Neutrophils Relative %: 82 %
Platelets: 590 10*3/uL — ABNORMAL HIGH (ref 150–400)
RBC: 3.7 MIL/uL — ABNORMAL LOW (ref 3.87–5.11)
RDW: 13.2 % (ref 11.5–15.5)
WBC: 18.8 10*3/uL — ABNORMAL HIGH (ref 4.0–10.5)
nRBC: 0 % (ref 0.0–0.2)

## 2022-05-24 LAB — GLUCOSE, CAPILLARY
Glucose-Capillary: 237 mg/dL — ABNORMAL HIGH (ref 70–99)
Glucose-Capillary: 365 mg/dL — ABNORMAL HIGH (ref 70–99)

## 2022-05-24 LAB — RESP PANEL BY RT-PCR (RSV, FLU A&B, COVID)  RVPGX2
Influenza A by PCR: NEGATIVE
Influenza B by PCR: NEGATIVE
Resp Syncytial Virus by PCR: NEGATIVE
SARS Coronavirus 2 by RT PCR: NEGATIVE

## 2022-05-24 LAB — TSH: TSH: 1.001 u[IU]/mL (ref 0.350–4.500)

## 2022-05-24 LAB — HIV ANTIBODY (ROUTINE TESTING W REFLEX): HIV Screen 4th Generation wRfx: NONREACTIVE

## 2022-05-24 LAB — APTT: aPTT: 30 seconds (ref 24–36)

## 2022-05-24 LAB — PROCALCITONIN: Procalcitonin: 0.72 ng/mL

## 2022-05-24 LAB — CBG MONITORING, ED: Glucose-Capillary: 235 mg/dL — ABNORMAL HIGH (ref 70–99)

## 2022-05-24 LAB — POC URINE PREG, ED: Preg Test, Ur: NEGATIVE

## 2022-05-24 LAB — PROTIME-INR
INR: 1.1 (ref 0.8–1.2)
Prothrombin Time: 14.4 seconds (ref 11.4–15.2)

## 2022-05-24 MED ORDER — INSULIN NPH (HUMAN) (ISOPHANE) 100 UNIT/ML ~~LOC~~ SUSP
15.0000 [IU] | Freq: Two times a day (BID) | SUBCUTANEOUS | Status: DC
  Filled 2022-05-24: qty 10

## 2022-05-24 MED ORDER — OXYCODONE-ACETAMINOPHEN 5-325 MG PO TABS
1.0000 | ORAL_TABLET | ORAL | Status: DC | PRN
  Administered 2022-05-24 – 2022-05-30 (×20): 1 via ORAL
  Filled 2022-05-24 (×20): qty 1

## 2022-05-24 MED ORDER — INSULIN ASPART 100 UNIT/ML IJ SOLN
0.0000 [IU] | Freq: Every day | INTRAMUSCULAR | Status: DC
  Administered 2022-05-24: 5 [IU] via SUBCUTANEOUS
  Filled 2022-05-24: qty 1

## 2022-05-24 MED ORDER — IOHEXOL 300 MG/ML  SOLN
100.0000 mL | Freq: Once | INTRAMUSCULAR | Status: AC | PRN
  Administered 2022-05-24: 100 mL via INTRAVENOUS

## 2022-05-24 MED ORDER — GABAPENTIN 300 MG PO CAPS
600.0000 mg | ORAL_CAPSULE | Freq: Every day | ORAL | Status: DC
  Administered 2022-05-25 – 2022-05-30 (×6): 600 mg via ORAL
  Filled 2022-05-24 (×6): qty 2

## 2022-05-24 MED ORDER — ACETAMINOPHEN 325 MG PO TABS
650.0000 mg | ORAL_TABLET | Freq: Four times a day (QID) | ORAL | Status: DC | PRN
  Administered 2022-05-25 (×2): 325 mg via ORAL
  Administered 2022-05-26 – 2022-05-30 (×3): 650 mg via ORAL
  Filled 2022-05-24 (×5): qty 2

## 2022-05-24 MED ORDER — IBUPROFEN 600 MG PO TABS
600.0000 mg | ORAL_TABLET | Freq: Once | ORAL | Status: AC
  Administered 2022-05-24: 600 mg via ORAL
  Filled 2022-05-24: qty 1

## 2022-05-24 MED ORDER — SODIUM CHLORIDE 0.9 % IV SOLN
2.0000 g | Freq: Once | INTRAVENOUS | Status: AC
  Administered 2022-05-24: 2 g via INTRAVENOUS
  Filled 2022-05-24: qty 12.5

## 2022-05-24 MED ORDER — SODIUM CHLORIDE 0.9 % IV SOLN
2.0000 g | Freq: Three times a day (TID) | INTRAVENOUS | Status: DC
  Administered 2022-05-24 – 2022-05-29 (×14): 2 g via INTRAVENOUS
  Filled 2022-05-24 (×3): qty 2
  Filled 2022-05-24: qty 12.5
  Filled 2022-05-24 (×2): qty 2
  Filled 2022-05-24: qty 12.5
  Filled 2022-05-24: qty 2
  Filled 2022-05-24: qty 12.5
  Filled 2022-05-24: qty 2
  Filled 2022-05-24: qty 12.5
  Filled 2022-05-24 (×3): qty 2
  Filled 2022-05-24: qty 12.5
  Filled 2022-05-24: qty 2

## 2022-05-24 MED ORDER — METOPROLOL TARTRATE 5 MG/5ML IV SOLN
2.5000 mg | INTRAVENOUS | Status: DC | PRN
  Administered 2022-05-24 – 2022-05-25 (×2): 2.5 mg via INTRAVENOUS
  Filled 2022-05-24 (×2): qty 5

## 2022-05-24 MED ORDER — FENOFIBRATE 160 MG PO TABS
160.0000 mg | ORAL_TABLET | Freq: Every day | ORAL | Status: DC
  Administered 2022-05-25 – 2022-05-31 (×7): 160 mg via ORAL
  Filled 2022-05-24 (×7): qty 1

## 2022-05-24 MED ORDER — SODIUM CHLORIDE 0.9 % IV SOLN: INTRAVENOUS | Status: DC

## 2022-05-24 MED ORDER — MORPHINE SULFATE (PF) 2 MG/ML IV SOLN
2.0000 mg | INTRAVENOUS | Status: DC | PRN
  Administered 2022-05-28 – 2022-05-29 (×4): 2 mg via INTRAVENOUS
  Filled 2022-05-24 (×4): qty 1

## 2022-05-24 MED ORDER — ONDANSETRON HCL 4 MG/2ML IJ SOLN
4.0000 mg | Freq: Three times a day (TID) | INTRAMUSCULAR | Status: DC | PRN
  Administered 2022-05-25 – 2022-05-28 (×3): 4 mg via INTRAVENOUS
  Filled 2022-05-24 (×2): qty 2

## 2022-05-24 MED ORDER — SODIUM CHLORIDE 0.9 % IV BOLUS (SEPSIS)
1000.0000 mL | Freq: Once | INTRAVENOUS | Status: AC
  Administered 2022-05-24: 1000 mL via INTRAVENOUS

## 2022-05-24 MED ORDER — SODIUM CHLORIDE 0.9 % IV BOLUS: 1000.0000 mL | Freq: Once | INTRAVENOUS | Status: DC

## 2022-05-24 MED ORDER — SUMATRIPTAN SUCCINATE 50 MG PO TABS: 100.0000 mg | ORAL_TABLET | ORAL | Status: DC | PRN

## 2022-05-24 MED ORDER — INSULIN ASPART 100 UNIT/ML IJ SOLN
0.0000 [IU] | Freq: Three times a day (TID) | INTRAMUSCULAR | Status: DC
  Filled 2022-05-24: qty 1

## 2022-05-24 NOTE — Consult Note (Signed)
PHARMACY -  BRIEF ANTIBIOTIC NOTE   Pharmacy has received consult(s) for cefepime dosing from an ED provider.  The patient's profile has been reviewed for ht/wt/allergies/indication/available labs.    One time order(s) placed for cefepime 2 grams IV x 1  Further antibiotics/pharmacy consults should be ordered by admitting physician if indicated.                       Thank you, Lorin Picket, PharmD 05/24/2022  1:26 PM

## 2022-05-24 NOTE — H&P (Addendum)
History and Physical    Katie Hampton [id number] DOB: 11-01-1980 DOA: 05/24/2022  Referring MD/NP/PA:   PCP: Candida Peeling, PA-C   Patient coming from:  The patient is coming from home.  At baseline, pt is independent for most of ADL.        Chief Complaint: right flank pain, burning on urination  HPI: Katie Hampton is a 42 y.o. female with medical history significant of pyelonephritis, HLD, diabetes mellitus, peripheral neuropathy, obesity, who presents with right flank pain, burning on urination.  Patient was diagnosed with UTI and started on Bactrim on 3/1.  She continues to have burning on urination, mild dysuria and increased urinary frequency.  She also noticed little blood in her urine occasionally. She developed right flank pain, which is constant, aching, moderate, nonradiating.  Patient also has subjective fever and chills, but her body temperature 98.2 in ED.  Patient has nausea and vomited twice, no diarrhea or abdominal pain.  No chest pain, cough, shortness breath.  Data reviewed independently and ED Course: pt was found to have WBC 18.8, lactic acid 3.1, GFR> 60, urinalysis negative, renal function okay.  CT scan showed right renal abscess.  Patient is admitted to telemetry bed as inpatient. Dr. Diamantina Providence of urology, Dr. Serafina Royals of IR, Dr. Rockey Situ of cardiology are consulted.  EKG: I have personally reviewed.  Seem to be new onset atrial flutter/A-fib with variable AV block, QTc 472, poor R wave progression   Review of Systems:   General: Has subjective fevers, chills, no body weight gain, has fatigue HEENT: no blurry vision, hearing changes or sore throat Respiratory: no dyspnea, coughing, wheezing CV: no chest pain, no palpitations GI: no nausea, vomiting, abdominal pain, diarrhea, constipation GU: has dysuria, burning on urination, increased urinary frequency, hematuria  Ext: no leg edema Neuro: no unilateral weakness, numbness, or tingling, no vision change or  hearing loss Skin: no rash, no skin tear. MSK: Has right flank pain  heme: No easy bruising.  Travel history: No recent long distant travel.   Allergy: No Known Allergies  Past Medical History:  Diagnosis Date   Diabetes mellitus    Pyelonephritis     Past Surgical History:  Procedure Laterality Date   CHOLECYSTECTOMY     EYE SURGERY Bilateral    TUBAL LIGATION Bilateral     Social History:  reports that she has quit smoking. Her smoking use included cigarettes. She has never used smokeless tobacco. She reports that she does not drink alcohol and does not use drugs.  Family History:  Family History  Problem Relation Age of Onset   Kidney disease Sister    Colon cancer Maternal Aunt      Prior to Admission medications   Medication Sig Start Date End Date Taking? Authorizing Provider  Aspirin-Caffeine (BAYER BACK & BODY PO) Take 1 tablet by mouth daily as needed (pain).    [provider]  cefdinir (OMNICEF) 300 MG capsule Take 1 capsule (300 mg total) by mouth 2 (two) times daily. 03/09/21   Johnn Hai, PA-C  HYDROcodone-acetaminophen (NORCO) 5-325 MG tablet Take 1 tablet by mouth every 4 (four) hours as needed for moderate pain. 05/17/22   Duanne Guess, PA-C  insulin NPH (HUMULIN N,NOVOLIN N) 100 UNIT/ML injection Inject 18-22 Units into the skin 2 (two) times daily before a meal. Per patients sliding scale: breakfast - 20-22 units,  supper 18-20 units    [provider]  insulin regular (NOVOLIN R,HUMULIN R) 100 units/mL injection Inject  18-24 Units into the skin 2 (two) times daily before a meal. Per patients sliding scale: breakfast 20-24 units, supper 18-20    [provider]  LUTEIN PO Take 1 capsule by mouth 2 (two) times daily.    [provider]  Multiple Vitamin (MULTIVITAMIN WITH MINERALS) TABS Take 1 tablet by mouth daily.    [provider]  Multiple Vitamins-Minerals (AIRBORNE PO) Take 1 tablet by mouth daily.     [provider]  naproxen sodium (ALEVE) 220 MG tablet Take 880 mg by mouth 2 (two) times daily as needed (pain).    [provider]  ondansetron (ZOFRAN-ODT) 4 MG disintegrating tablet Take 1 tablet (4 mg total) by mouth every 8 (eight) hours as needed for nausea or vomiting. 05/17/22   Duanne Guess, PA-C  potassium chloride SA (KLOR-CON M) 20 MEQ tablet Take 1 tablet (20 mEq total) by mouth daily. 03/09/21   Johnn Hai, PA-C  sulfamethoxazole-trimethoprim (BACTRIM DS) 800-160 MG tablet Take 1 tablet by mouth 2 (two) times daily for 10 days. 05/17/22 05/27/22  Duanne Guess, PA-C    Physical Exam: Vitals:   05/24/22 1254 05/24/22 1255 05/24/22 1614 05/24/22 1645  BP: (!) 154/90  118/68 115/71  Pulse: (!) 112  (!) 117 98  Resp: '16  18 18  '$ Temp: 98.2 F (36.8 C)  98 F (36.7 C) 98.6 F (37 C)  TempSrc: Oral   Oral  SpO2: 100%  97% 100%  Weight:  100.7 kg    Height:  '5\' 3"'$  (1.6 m)     General: Not in acute distress HEENT:       Eyes: PERRL, EOMI, no scleral icterus.       ENT: No discharge from the ears and nose, no pharynx injection, no tonsillar enlargement.        Neck: No JVD, no bruit, no mass felt. Heme: No neck lymph node enlargement. Cardiac: S1/S2, irregular heartbeat, no murmurs, No gallops or rubs. Respiratory: No rales, wheezing, rhonchi or rubs. GI: Soft, nondistended, nontender, no rebound pain, no organomegaly, BS present. GU: Has right CVA tenderness Ext: No pitting leg edema bilaterally. 1+DP/PT pulse bilaterally. Musculoskeletal: No joint deformities, No joint redness or warmth, no limitation of ROM in spin. Skin: No rashes.  Neuro: Alert, oriented X3, cranial nerves II-XII grossly intact, moves all extremities normally.   Psych: Patient is not psychotic, no suicidal or hemocidal ideation.  Labs on Admission: I have personally reviewed following labs and imaging studies  CBC: Recent Labs  Lab 05/24/22 1306  WBC 18.8*   NEUTROABS 15.5*  HGB 11.2*  HCT 35.3*  MCV 95.4  PLT [id number]*   Basic Metabolic Panel: Recent Labs  Lab 05/24/22 1306  NA 132*  K 3.9  CL 97*  CO2 23  GLUCOSE 234*  BUN 7  CREATININE 0.82  CALCIUM 8.5*   GFR: Estimated Creatinine Clearance: 103.2 mL/min (by C-G formula based on SCr of 0.82 mg/dL). Liver Function Tests: Recent Labs  Lab 05/24/22 1306  AST 23  ALT 17  ALKPHOS 231*  BILITOT 0.5  PROT 7.7  ALBUMIN 2.6*   No results for input(s): "LIPASE", "AMYLASE" in the last 168 hours. No results for input(s): "AMMONIA" in the last 168 hours. Coagulation Profile: Recent Labs  Lab 05/24/22 1704  INR 1.1   Cardiac Enzymes: No results for input(s): "CKTOTAL", "CKMB", "CKMBINDEX", "TROPONINI" in the last 168 hours. BNP (last 3 results) No results for input(s): "PROBNP" in the last 8760  hours. HbA1C: No results for input(s): "HGBA1C" in the last 72 hours. CBG: Recent Labs  Lab 05/24/22 1304 05/24/22 1720  GLUCAP 235* 237*   Lipid Profile: No results for input(s): "CHOL", "HDL", "LDLCALC", "TRIG", "CHOLHDL", "LDLDIRECT" in the last 72 hours. Thyroid Function Tests: No results for input(s): "TSH", "T4TOTAL", "FREET4", "T3FREE", "THYROIDAB" in the last 72 hours. Anemia Panel: No results for input(s): "VITAMINB12", "FOLATE", "FERRITIN", "TIBC", "IRON", "RETICCTPCT" in the last 72 hours. Urine analysis:    Component Value Date/Time   COLORURINE STRAW (A) 05/24/2022 1306   APPEARANCEUR CLEAR (A) 05/24/2022 1306   LABSPEC 1.005 05/24/2022 1306   PHURINE 7.0 05/24/2022 1306   GLUCOSEU NEGATIVE 05/24/2022 1306   HGBUR NEGATIVE 05/24/2022 1306   BILIRUBINUR NEGATIVE 05/24/2022 1306   KETONESUR NEGATIVE 05/24/2022 1306   PROTEINUR NEGATIVE 05/24/2022 1306   UROBILINOGEN 0.2 07/30/2015 1316   NITRITE NEGATIVE 05/24/2022 1306   LEUKOCYTESUR NEGATIVE 05/24/2022 1306   Sepsis Labs: '@LABRCNTIP'$ (procalcitonin:4,lacticidven:4) ) Recent Results (from the past 240  hour(s))  Urine Culture     Status: None   Collection Time: 05/17/22  5:47 PM   Specimen: Urine, Clean Catch  Result Value Ref Range Status   Specimen Description   Final    URINE, CLEAN CATCH Performed at [facility], [place] [address], [place], [place] [address]    Special Requests   Final    NONE Reflexed from [id number] Performed at [facility], [address] [place], [place] [address]    Culture   Final    NO GROWTH Performed at [facility], [place] [address], [place], [place] [address]    Report Status 05/19/2022 FINAL  Final  Resp panel by RT-PCR (RSV, Flu A&B, Covid) Anterior Nasal Swab     Status: None   Collection Time: 05/24/22  1:31 PM   Specimen: Anterior Nasal Swab  Result Value Ref Range Status   SARS Coronavirus 2 by RT PCR NEGATIVE NEGATIVE Final    Comment: (NOTE) SARS-CoV-2 target nucleic acids are NOT DETECTED.  The SARS-CoV-2 RNA is generally detectable in upper respiratory specimens during the acute phase of infection. The lowest concentration of SARS-CoV-2 viral copies this assay can detect is 138 copies/mL. A negative result does not preclude SARS-Cov-2 infection and should not be used as the sole basis for treatment or other patient management decisions. A negative result may occur with  improper specimen collection/handling, submission of specimen other than nasopharyngeal swab, presence of viral mutation(s) within the areas targeted by this assay, and inadequate number of viral copies(<138 copies/mL). A negative result must be combined with clinical observations, patient history, and epidemiological information. The expected result is Negative.  Fact Sheet for Patients:  EntrepreneurPulse.com.au  Fact Sheet for Healthcare Providers:  IncredibleEmployment.be  This test is no t yet approved or cleared by the [place] FDA and  has been authorized for detection and/or diagnosis of SARS-CoV-2  by FDA under an Emergency Use Authorization (EUA). This EUA will remain  in effect (meaning this test can be used) for the duration of the COVID-19 declaration under Section 564(b)(1) of the Act, 21 U.S.C.section 360bbb-3(b)(1), unless the authorization is terminated  or revoked sooner.       Influenza A by PCR NEGATIVE NEGATIVE Final   Influenza B by PCR NEGATIVE NEGATIVE Final    Comment: (NOTE) The Xpert Xpress SARS-CoV-2/FLU/RSV plus assay is intended as an aid in the diagnosis of influenza from Nasopharyngeal swab specimens and should not be used  as a sole basis for treatment. Nasal washings and aspirates are unacceptable for Xpert Xpress SARS-CoV-2/FLU/RSV testing.  Fact Sheet for Patients: EntrepreneurPulse.com.au  Fact Sheet for Healthcare Providers: IncredibleEmployment.be  This test is not yet approved or cleared by the [place] FDA and has been authorized for detection and/or diagnosis of SARS-CoV-2 by FDA under an Emergency Use Authorization (EUA). This EUA will remain in effect (meaning this test can be used) for the duration of the COVID-19 declaration under Section 564(b)(1) of the Act, 21 U.S.C. section 360bbb-3(b)(1), unless the authorization is terminated or revoked.     Resp Syncytial Virus by PCR NEGATIVE NEGATIVE Final    Comment: (NOTE) Fact Sheet for Patients: EntrepreneurPulse.com.au  Fact Sheet for Healthcare Providers: IncredibleEmployment.be  This test is not yet approved or cleared by the [place] FDA and has been authorized for detection and/or diagnosis of SARS-CoV-2 by FDA under an Emergency Use Authorization (EUA). This EUA will remain in effect (meaning this test can be used) for the duration of the COVID-19 declaration under Section 564(b)(1) of the Act, 21 U.S.C. section 360bbb-3(b)(1), unless the authorization is terminated or revoked.  Performed at  [facility], [address] [place], [place] [address]      Radiological Exams on Admission: CT ABDOMEN PELVIS W CONTRAST  Result Date: 05/24/2022 CLINICAL DATA:  Renal abscess.  Sepsis.  Recent UTI. EXAM: CT ABDOMEN AND PELVIS WITH CONTRAST TECHNIQUE: Multidetector CT imaging of the abdomen and pelvis was performed using the standard protocol following bolus administration of intravenous contrast. RADIATION DOSE REDUCTION: This exam was performed according to the departmental dose-optimization program which includes automated exposure control, adjustment of the mA and/or kV according to patient size and/or use of iterative reconstruction technique. CONTRAST:  137m OMNIPAQUE IOHEXOL 300 MG/ML  SOLN COMPARISON:  Noncontrast CT 05/24/2022 earlier FINDINGS: Lower chest: Minimal basilar scar or atelectasis. No pleural effusion. There is possible enlarged node to the right of the esophagus at the edge of the imaging field on series 2, image 1 measuring 13 mm. Hepatobiliary: No space-occupying liver lesion. Patent portal vein. Previous cholecystectomy. Pancreas: Mild pancreatic atrophy without mass. Spleen: Borderline enlarged spleen at 13 cm. Adrenals/Urinary Tract: Adrenal glands are preserved. No enhancing lesion in the left kidney. No collecting system dilatation. Once again there is a 3 mm stone lower pole of the right kidney. There is enlargement of the right kidney with severe perinephric stranding and thickening. There are several areas of poor enhancement along the mid to upper aspect of the right kidney with several fluid collections. Largest is seen superiorly and lateral on series 2 image 32 measuring 3.5 by 3.0 cm in the axial plane. Cephalocaudal 3.1 cm. There are additional foci extending along the midportion of the kidney laterally which is multiloculated. On axial image 40 of series 2 3.2 by 2.1 by 3.8 cm. This may extend subcapsular as well. Additional smaller foci slightly above this  in addition. Overall cephalocaudal extent a proximally 7.3 cm. These are worrisome for abscess formation. No collecting system dilatation of the right kidney. Preserved contours of the urinary bladder. Stomach/Bowel: Moderate colonic stool. Large bowel is of normal course and caliber normal appendix. Small bowel is nondilated. Stomach is nondilated. Vascular/Lymphatic: Normal caliber aorta and IVC. There are several small nodes identified in the retroperitoneum. These are less than a cm in short axis and not pathologic by size criteria. These could be reactive. Reproductive: Uterus and bilateral adnexa are unremarkable. Other: No free air. No ascites.  Small fat containing umbilical hernia. Musculoskeletal: Mild degenerative changes along the spine with some curvature. IMPRESSION: Inflammatory changes involving the right kidney with poor enhancement and multiple fluid collections consistent with abscess formation and underlying pyelonephritis. Nonobstructing right-sided renal stone. Critical Value/emergent results were called by telephone at the time of interpretation on 05/24/2022 at 12:30 pm to provider Ashok Cordia , who verbally acknowledged these results. Electronically Signed   By: Jill Side M.D.   On: 05/24/2022 15:32   CT Renal Stone Study  Result Date: 05/24/2022 CLINICAL DATA:  Flank pain. EXAM: CT ABDOMEN AND PELVIS WITHOUT CONTRAST TECHNIQUE: Multidetector CT imaging of the abdomen and pelvis was performed following the standard protocol without IV contrast. RADIATION DOSE REDUCTION: This exam was performed according to the departmental dose-optimization program which includes automated exposure control, adjustment of the mA and/or kV according to patient size and/or use of iterative reconstruction technique. COMPARISON:  CT 05/17/2022 and older FINDINGS: Lower chest: There is some breathing motion along the lung bases. Mild scar or atelectasis. No pleural effusion. These areas are improving from  previous. Hepatobiliary: On this non IV contrast exam, grossly the liver is preserved. Previous cholecystectomy. Pancreas: Global pancreatic atrophy without obvious mass lesion or ductal dilatation. Spleen: Once again borderline enlarged spleen measuring 13 cm in cephalocaudal length. Adrenals/Urinary Tract: Adrenal glands are preserved. No abnormal calcification in the left kidney nor along the course of the left ureter. Once again there is enlargement of the right kidney with stranding of the perinephric space. The amount of stranding is increasing. There is a nonobstructing stone in the lower pole of the right kidney. No right ureteral stone. There are developing cystic areas identified as well at this time towards the upper aspect of the right kidney. These are somewhat ill-defined on this noncontrast exam but for example area on series 2, image 28 measures 3.5 by 3.7 by 3.1 cm. Additional smaller area more caudal as seen on axial image 35 of series 2 measuring 2.8 by 2.4 cm. With the provided history of pyelonephritis, developing abscess or phlegmonous changes possible recommend further evaluation. A contrast study may be of some benefit if the patient is clinically able to further define these areas. Stomach/Bowel: Large bowel has a normal course and caliber with mild scattered stool. Normal appendix. The stomach and small bowel are nondilated. Vascular/Lymphatic: Normal caliber aorta and IVC. There are some small retroperitoneal nodes identified which are not pathologic by size criteria. Reproductive: Uterus and bilateral adnexa are unremarkable. Other: No ascites or free air. Musculoskeletal: Mild curvature of the spine. Minimal degenerative change. IMPRESSION: 3 mm nonobstructing lower pole right-sided renal stone again seen. Increasing perinephric stranding with increasing heterogeneous appearance of the renal parenchyma with developing ill-defined low-density areas superior laterally. No associated air.  With patient's history of infection, developing abscess or phlegmonous changes possible. These areas could be defined further within IV contrast examination if clinically able. Electronically Signed   By: Jill Side M.D.   On: 05/24/2022 14:15      Assessment/Plan Principal Problem:   Renal abscess, right Active Problems:   Severe sepsis (HCC)   Diabetes mellitus ([place])   Peripheral neuropathy   HLD (hyperlipidemia)   New onset atrial fibrillation (HCC)   Obesity (BMI 30-39.9)   Assessment and Plan:   Severe sepsis due to renal abscess, right: Patient admits criteria for severe sepsis with WBC 18.8, heart rate of 112, lactic acid 3.1.  He DVT send consulted Dr. Diamantina Providence of urology, who recommended  IR consultation for possible drainage.  Then Dr. Serafina Royals of IR is consulted. Sine pt is hemodynamically stable, Dr. Serafina Royals recommended continuation of IV antibiotics. If clinical course worsens or she fails to improve, call back to IR for consideration of image guide drain.   -will admit to tele bet as inpt -IV cefepime -Follow-up of blood culture and urine culture -IV fluid: 2 L normal saline, 125 cc/h but then -Follow-up blood culture and urine culture -Trend lactic acid level and check procalcitonin level  Diabetes mellitus (type 1 diabetes per patient, with peripheral neuropathy): No A1c available.  Blood sugar 70 white female.  Patient is taking Novolin and NPH insulin 20-22 units in AM and 18-20 units in PM -SSI -NPH insulin 50 units twice daily  Peripheral neuropathy -Neurontin  HLD (hyperlipidemia) -Fenofibrate  New onset atrial fibrillation [facility]): EKG seems to have new onset A flutter/A-fib with variable AV block.  Heart rate 100-110.  CHADS2 score is 1 (diabetes), no need for long term use of anticoagulants.  Likely triggered by severe sepsis. -Check TSH -As needed metoprolol 2.5 mg every 2 hour for heart rate> 125 -check trop  -Consulted Dr. Rockey Situ for  cardiology  Obesity (BMI 30-39.9): Body weight 100.7 kg, BMI 39.33 -Healthy diet, and exercise -Encourage losing weight       DVT ppx: SCD  Code Status: Full code  Family Communication: not done, no family member is at bed side.    Disposition Plan:  Anticipate discharge back to previous environment  Consults called:  Dr. Diamantina Providence of urology, Dr. Serafina Royals of IR, Dr. Rockey Situ of cardiology are consulted.  Admission status and Level of care: Telemetry Medical:     as inpt       Dispo: The patient is from: Home              Anticipated d/c is to: Home              Anticipated d/c date is: 2 days              Patient currently is not medically stable to d/c.    Severity of Illness:  The appropriate patient status for this patient is INPATIENT. Inpatient status is judged to be reasonable and necessary in order to provide the required intensity of service to ensure the patient's safety. The patient's presenting symptoms, physical exam findings, and initial radiographic and laboratory data in the context of their chronic comorbidities is felt to place them at high risk for further clinical deterioration. Furthermore, it is not anticipated that the patient will be medically stable for discharge from the hospital within 2 midnights of admission.   * I certify that at the point of admission it is my clinical judgment that the patient will require inpatient hospital care spanning beyond 2 midnights from the point of admission due to high intensity of service, high risk for further deterioration and high frequency of surveillance required.*       Date of Service 05/24/2022    Ivor Costa [facility]   If 7PM-7AM, please contact night-coverage [email or web address] 05/24/2022, 6:38 PM

## 2022-05-24 NOTE — Consult Note (Signed)
CODE SEPSIS - PHARMACY COMMUNICATION  **Broad Spectrum Antibiotics should be administered within 1 hour of Sepsis diagnosis**  Time Code Sepsis Called/Page Received: 1323  Antibiotics Ordered: cefepime  Time of 1st antibiotic administration: 1340  Additional action taken by pharmacy: [place] ,PharmD Clinical Pharmacist  05/24/2022  1:27 PM

## 2022-05-24 NOTE — Progress Notes (Signed)
Interventional Radiology Brief Update  Ms. Dils's case was brought to my attention for possible renal drain placement.  She presents with sepsis secondary to right pyelonephritis complicated by abscess formation. CT today shows multiloculated fluid collections in the right kidney, the largest measuring up to 3.5 cm.  The ED provider described the patient being treated with various oral antibiotics over the past week or 2 which did not sound like adequate coverage.  Given clinical stability, I recommend continuation of appropriate IV antibiotics while inpatient, and if clinical course worsens or she fails to improve, please contact IR for consideration of image guide drain placement(s).    Ruthann Cancer, MD Pager: [phone]

## 2022-05-24 NOTE — ED Triage Notes (Signed)
Patient c/o fever, lower back pain, and nausea. Diagnosed with UTI and given bactrim last Friday.

## 2022-05-24 NOTE — ED Provider Notes (Signed)
[facility] Provider Note    Event Date/Time   First MD Initiated Contact with Patient 05/24/22 1314     (approximate)   History   Recurrent UTI   HPI  Katie Hampton is a 42 y.o. female with history of diabetes and pyelonephritis presents emergency department with continued UTI symptoms along with fever, chills, and back pain.  Patient was seen in the ED and given a dose of Rocephin IV on Friday.  Has been taking Bactrim for UTI since last Friday/3 1/24.  Denies vomiting at this time.      Physical Exam   Triage Vital Signs: ED Triage Vitals  Enc Vitals Group     BP 05/24/22 1254 (!) 154/90     Pulse Rate 05/24/22 1254 (!) 112     Resp 05/24/22 1254 16     Temp 05/24/22 1254 98.2 F (36.8 C)     Temp Source 05/24/22 1254 Oral     SpO2 05/24/22 1254 100 %     Weight 05/24/22 1255 222 lb (100.7 kg)     Height 05/24/22 1255 '5\' 3"'$  (1.6 m)     Head Circumference --      Peak Flow --      Pain Score 05/24/22 1255 4     Pain Loc --      Pain Edu? --      Excl. in [place]? --     Most recent vital signs: Vitals:   05/24/22 1254  BP: (!) 154/90  Pulse: (!) 112  Resp: 16  Temp: 98.2 F (36.8 C)  SpO2: 100%     General: Awake, no distress.   CV:  Good peripheral perfusion.  Tachycardic, regular rhythm  resp:  Normal effort. Lungs CTA Abd:  No distention.  Tender all 4 quadrants, no CVA tenderness Other:      ED Results / Procedures / Treatments   Labs (all labs ordered are listed, but only abnormal results are displayed) Labs Reviewed  LACTIC ACID, PLASMA - Abnormal; Notable for the following components:      Result Value   Lactic Acid, Venous 3.1 (*)    All other components within normal limits  COMPREHENSIVE METABOLIC PANEL - Abnormal; Notable for the following components:   Sodium 132 (*)    Chloride 97 (*)    Glucose, Bld 234 (*)    Calcium 8.5 (*)    Albumin 2.6 (*)    Alkaline Phosphatase 231 (*)    All other components  within normal limits  CBC WITH DIFFERENTIAL/PLATELET - Abnormal; Notable for the following components:   WBC 18.8 (*)    RBC 3.70 (*)    Hemoglobin 11.2 (*)    HCT 35.3 (*)    Platelets 590 (*)    Neutro Abs 15.5 (*)    Abs Immature Granulocytes 0.15 (*)    All other components within normal limits  URINALYSIS, ROUTINE W REFLEX MICROSCOPIC - Abnormal; Notable for the following components:   Color, Urine STRAW (*)    APPearance CLEAR (*)    All other components within normal limits  CBG MONITORING, ED - Abnormal; Notable for the following components:   Glucose-Capillary 235 (*)    All other components within normal limits  RESP PANEL BY RT-PCR (RSV, FLU A&B, COVID)  RVPGX2  CULTURE, BLOOD (ROUTINE X 2)  CULTURE, BLOOD (ROUTINE X 2)  URINE CULTURE  LACTIC ACID, PLASMA  PROTIME-INR  APTT  POC URINE PREG, ED  EKG  EKG   RADIOLOGY CT renal stone    PROCEDURES:   .Critical Care  Performed by: Versie Starks, PA-C Authorized by: Versie Starks, PA-C   Critical care provider statement:    Critical care time (minutes):  45   Critical care time was exclusive of:  Separately billable procedures and treating other patients   Critical care was necessary to treat or prevent imminent or life-threatening deterioration of the following conditions:  Sepsis   Critical care was time spent personally by me on the following activities:  Blood draw for specimens, development of treatment plan with patient or surrogate, discussions with consultants, evaluation of patient's response to treatment, examination of patient, interpretation of cardiac output measurements, obtaining history from patient or surrogate, ordering and performing treatments and interventions, ordering and review of laboratory studies, ordering and review of radiographic studies, re-evaluation of patient's condition and review of old charts   Care discussed with: admitting provider      MEDICATIONS ORDERED IN  ED: Medications  oxyCODONE-acetaminophen (PERCOCET/ROXICET) 5-325 MG per tablet 1 tablet (has no administration in time range)  morphine (PF) 2 MG/ML injection 2 mg (has no administration in time range)  sodium chloride 0.9 % bolus 1,000 mL (has no administration in time range)  0.9 %  sodium chloride infusion (has no administration in time range)  sodium chloride 0.9 % bolus 1,000 mL (1,000 mLs Intravenous New Bag/Given 05/24/22 1341)  ceFEPIme (MAXIPIME) 2 g in sodium chloride 0.9 % 100 mL IVPB (0 g Intravenous Stopped 05/24/22 1548)  ibuprofen (ADVIL) tablet 600 mg (600 mg Oral Given 05/24/22 1355)  iohexol (OMNIPAQUE) 300 MG/ML solution 100 mL (100 mLs Intravenous Contrast Given 05/24/22 1448)     IMPRESSION / MDM / [place] / ED COURSE  I reviewed the triage vital signs and the nursing notes.                              Differential diagnosis includes, but is not limited to, sepsis, pyelonephritis, UTI, COVID or influenza  Patient's presentation is most consistent with acute presentation with potential threat to life or bodily function.   Labs, EKG, imaging  Patient's labs are concerning for sepsis with a WBC of 123456 Metabolic panel is also concerning with a glucose of 234, her alk phosphatase is 231 which has usually been normal when reviewing her past medical records.  Fluids and antibiotics started for resistant UTI causing sepsis  Lactic is 3.1 confirming sepsis  Remainder labs reassuring  CT renal stone, I did individually review the radiologist report and interpret this as needing additional workup as he is concerned about an abscess on the right kidney.  CT abdomen pelvis IV contrast  Will consult hospitalist after CT with contrast as we may have to consult urology  Consult with radiologist, Dr. Lyndel Safe states the patient has an abscess on the right kidney, states that she will most likely need a drain.  Consult urology, Dr. Alexander Bergeron states that the area is  borderline for needing a drain.  States have the hospitalist admit and consult IR  Consult hospitalist for admission  ----------------------------------------- 3:39 PM on 05/24/2022 ----------------------------------------- Dr. Blaine Hamper will be admitting, asked that we contact IR for him.  Consult to IR  I have been in close contact with the patient obtaining her with results in treatment plan.  She is in agreement with the admission and treatment plan.  Explained to  her that the hospitalist and interventional radiology will decide if she actually needs a drain to the kidney.  Spoke with DR Lillard Anes from IR, would like to see how she progresses on antibiotics if worsening call him   Advised DR Blaine Hamper of conversation, pt admitted for sepis, pyelonephritis and kidney abscess   FINAL CLINICAL IMPRESSION(S) / ED DIAGNOSES   Final diagnoses:  Acute pyelonephritis  Abscess of right kidney  Acute sepsis ([place])     Rx / DC Orders   ED Discharge Orders     None        Note:  This document was prepared using Dragon voice recognition software and may include unintentional dictation errors.    Versie Starks, PA-C 05/24/22 1609    Merlyn Lot, MD 05/24/22 [phone]

## 2022-05-24 NOTE — Consult Note (Signed)
Pharmacy Antibiotic Note  Katie Hampton is a 42 y.o. female admitted on 05/24/2022 with renal abscess/sepsis.  Pharmacy has been consulted for cefepime dosing.  Plan: -Start cefepime 2 grams IV every 8 hours -Follow renal function for adjustments  Height: '5\' 3"'$  (160 cm) Weight: 100.7 kg (222 lb) IBW/kg (Calculated) : 52.4  Temp (24hrs), Avg:98.3 F (36.8 C), Min:98 F (36.7 C), Max:98.6 F (37 C)  Recent Labs  Lab 05/17/22 1747 05/24/22 1306 05/24/22 1331  WBC 10.7* 18.8*  --   CREATININE 0.93 0.82  --   LATICACIDVEN  --  3.1* 1.5    Estimated Creatinine Clearance: 103.2 mL/min (by C-G formula based on SCr of 0.82 mg/dL).    No Known Allergies  Antimicrobials this admission: cefepime 3/8 >>    Dose adjustments this admission: N/A  Microbiology results: 3/8 BCx: pending 3/83 UCx: pending    Thank you for allowing pharmacy to be a part of this patient's care.  Lorin Picket, PharmD 05/24/2022 4:59 PM

## 2022-05-24 NOTE — Progress Notes (Signed)
[facility] will follow per sepsis protocol  

## 2022-05-24 NOTE — Progress Notes (Signed)
Contacted by ER regarding patient with right renal abscess and concern for sepsis.  No hydronephrosis or ureteral stone.  Typically renal abscess <3cm can be managed with antibiotics alone, however I reviewed the imaging and abscess does appear to be between 3 to 4 cm in size, would recommend hospitalist admission for antibiotics and resuscitation, with interventional radiology consult to see if they feel abscess is amenable to a percutaneous drain  Nickolas Madrid, MD 05/24/2022

## 2022-05-25 DIAGNOSIS — I4891 Unspecified atrial fibrillation: Secondary | ICD-10-CM

## 2022-05-25 DIAGNOSIS — N151 Renal and perinephric abscess: Secondary | ICD-10-CM | POA: Diagnosis not present

## 2022-05-25 LAB — GLUCOSE, CAPILLARY
Glucose-Capillary: 284 mg/dL — ABNORMAL HIGH (ref 70–99)
Glucose-Capillary: 412 mg/dL — ABNORMAL HIGH (ref 70–99)
Glucose-Capillary: 246 mg/dL — ABNORMAL HIGH (ref 70–99)
Glucose-Capillary: 148 mg/dL — ABNORMAL HIGH (ref 70–99)
Glucose-Capillary: 243 mg/dL — ABNORMAL HIGH (ref 70–99)

## 2022-05-25 LAB — BASIC METABOLIC PANEL
Anion gap: 9 (ref 5–15)
BUN: 6 mg/dL (ref 6–20)
CO2: 22 mmol/L (ref 22–32)
Calcium: 8 mg/dL — ABNORMAL LOW (ref 8.9–10.3)
Chloride: 100 mmol/L (ref 98–111)
Creatinine, Ser: 0.78 mg/dL (ref 0.44–1.00)
GFR, Estimated: >60 mL/min (ref >60)
Glucose, Bld: 343 mg/dL — ABNORMAL HIGH (ref 70–99)
Potassium: 4 mmol/L (ref 3.5–5.1)
Sodium: 131 mmol/L — ABNORMAL LOW (ref 135–145)

## 2022-05-25 LAB — CBC
HCT: 29.2 % — ABNORMAL LOW (ref 36.0–46.0)
Hemoglobin: 9.2 g/dL — ABNORMAL LOW (ref 12.0–15.0)
MCH: 29.6 pg (ref 26.0–34.0)
MCHC: 31.5 g/dL (ref 30.0–36.0)
MCV: 93.9 fL (ref 80.0–100.0)
Platelets: 524 10*3/uL — ABNORMAL HIGH (ref 150–400)
RBC: 3.11 MIL/uL — ABNORMAL LOW (ref 3.87–5.11)
RDW: 13.2 % (ref 11.5–15.5)
WBC: 15.2 10*3/uL — ABNORMAL HIGH (ref 4.0–10.5)
nRBC: 0 % (ref 0.0–0.2)

## 2022-05-25 MED ORDER — INSULIN ASPART 100 UNIT/ML IJ SOLN
0.0000 [IU] | Freq: Three times a day (TID) | INTRAMUSCULAR | Status: DC
  Administered 2022-05-25: 2 [IU] via SUBCUTANEOUS
  Administered 2022-05-25: 5 [IU] via SUBCUTANEOUS
  Administered 2022-05-26: 40 [IU] via SUBCUTANEOUS
  Administered 2022-05-26: 11 [IU] via SUBCUTANEOUS
  Administered 2022-05-27: 15 [IU] via SUBCUTANEOUS
  Administered 2022-05-27: 11 [IU] via SUBCUTANEOUS
  Administered 2022-05-28: 15 [IU] via SUBCUTANEOUS
  Administered 2022-05-28: 5 [IU] via SUBCUTANEOUS
  Administered 2022-05-29: 15 [IU] via SUBCUTANEOUS
  Administered 2022-05-29: 11 [IU] via SUBCUTANEOUS
  Administered 2022-05-30: 3 [IU] via SUBCUTANEOUS
  Administered 2022-05-30: 5 [IU] via SUBCUTANEOUS
  Administered 2022-05-31: 3 [IU] via SUBCUTANEOUS
  Filled 2022-05-25 (×14): qty 1

## 2022-05-25 MED ORDER — METOPROLOL TARTRATE 25 MG PO TABS
12.5000 mg | ORAL_TABLET | Freq: Two times a day (BID) | ORAL | Status: DC
  Administered 2022-05-25: 12.5 mg via ORAL
  Filled 2022-05-25: qty 1

## 2022-05-25 MED ORDER — INSULIN ASPART 100 UNIT/ML IJ SOLN
0.0000 [IU] | Freq: Every day | INTRAMUSCULAR | Status: DC
  Administered 2022-05-25: 2 [IU] via SUBCUTANEOUS
  Administered 2022-05-27: 4 [IU] via SUBCUTANEOUS
  Administered 2022-05-28: 5 [IU] via SUBCUTANEOUS
  Administered 2022-05-30: 2 [IU] via SUBCUTANEOUS
  Filled 2022-05-25 (×4): qty 1

## 2022-05-25 MED ORDER — ENOXAPARIN SODIUM 40 MG/0.4ML IJ SOSY: 40.0000 mg | PREFILLED_SYRINGE | INTRAMUSCULAR | Status: DC

## 2022-05-25 MED ORDER — SODIUM CHLORIDE 0.9 % IV SOLN: Freq: Once | INTRAVENOUS | Status: AC

## 2022-05-25 MED ORDER — METOPROLOL TARTRATE 25 MG PO TABS
25.0000 mg | ORAL_TABLET | Freq: Two times a day (BID) | ORAL | Status: DC
  Administered 2022-05-25 – 2022-05-26 (×2): 25 mg via ORAL
  Filled 2022-05-25 (×2): qty 1

## 2022-05-25 MED ORDER — INSULIN NPH (HUMAN) (ISOPHANE) 100 UNIT/ML ~~LOC~~ SUSP
20.0000 [IU] | Freq: Two times a day (BID) | SUBCUTANEOUS | Status: DC
  Administered 2022-05-25 – 2022-05-26 (×2): 20 [IU] via SUBCUTANEOUS

## 2022-05-25 MED ORDER — ENOXAPARIN SODIUM 60 MG/0.6ML IJ SOSY
0.5000 mg/kg | PREFILLED_SYRINGE | INTRAMUSCULAR | Status: DC
  Administered 2022-05-25 – 2022-05-31 (×5): 50 mg via SUBCUTANEOUS
  Filled 2022-05-25 (×7): qty 0.6

## 2022-05-25 MED ORDER — INSULIN ASPART 100 UNIT/ML IJ SOLN
25.0000 [IU] | Freq: Once | INTRAMUSCULAR | Status: AC
  Administered 2022-05-25: 25 [IU] via SUBCUTANEOUS
  Filled 2022-05-25: qty 1

## 2022-05-25 NOTE — Progress Notes (Signed)
Patient on telemetry revealing irregular, atrial flutter, atrail fibrillation with variable block. Heart rate has largely been variable overnight, Metoprolol 2.5 mg IV administered x2 for heart rate above 125 BPM. Patient remains stable, asymptomatic. Denies chest pain, and discomfort. Hospitalist on call made aware.

## 2022-05-25 NOTE — Plan of Care (Signed)
  Problem: Education: Goal: Ability to describe self-care measures that may prevent or decrease complications (Diabetes Survival Skills Education) will improve 05/25/2022 0620 by Mordecai Rasmussen, RN Outcome: Progressing 05/25/2022 0620 by Mordecai Rasmussen, RN Outcome: Progressing Goal: Individualized Educational Video(s) 05/25/2022 0620 by Mordecai Rasmussen, RN Outcome: Progressing 05/25/2022 0620 by Mordecai Rasmussen, RN Outcome: Progressing   Problem: Coping: Goal: Ability to adjust to condition or change in health will improve 05/25/2022 0620 by Mordecai Rasmussen, RN Outcome: Progressing 05/25/2022 0620 by Mordecai Rasmussen, RN Outcome: Progressing

## 2022-05-25 NOTE — Consult Note (Incomplete Revision)
Cardiology Consultation   Patient ID: Katie Hampton MRN: [id number]; DOB: [birth year]  Admit date: 05/24/2022 Date of Consult: 05/25/2022  PCP:  Candida Peeling, [place] Prov[facility] Cardiologist:  None        Patient Profile:   Katie Hampton is a 42 y.o. female with a hx of diabetes, pyelonephritis, recurrent UTIs, and kidney stones who is being seen 05/25/2022 for the evaluation of new onset atrial fibrillation/atrial flutter at the request of Dr. Blaine Hamper.  History of Present Illness:   Katie Hampton is a 42 year old female with a history of diabetes, frequent UTIs, pyelonephritis, and history of kidney stones who was just recently evaluated in the emergency department on 05/17/22 for the evaluation of right-sided flank pain, dysuria, and increased urinary frequency.  5 days ago she developed symptoms of dysuria and then increased urinary frequency.  A few days later she was seen at the [facility] in [facility] and diagnosed with a UTI and placed on Cipro.  There was no improvement over the last couple days and her symptoms while being on antibiotics.  She had subjective fevers, no nausea or vomiting on continues to have right-sided flank pain with urinary discomfort.  She was given 1 L of fluids, vital signs remained stable, CT scan obtained showed renal stone but no ureteral stones, her antibiotics were changed and she was subsequently discharged from the ER.  She returned to the [facility] emergency department on 05/24/2022 with complaints of fever, low back pain, and nausea.  She was seen in the emergency department on the previous Friday and was given a dose of IV Rocephin and had been taking the Bactrim since that time.  Unfortunately she had had no improvement in symptoms and return to the emergency department with the same complaints and concerns.  She denied any other symptoms of chest pain, shortness of breath, palpitations, peripheral edema, lightheadedness or  dizziness.  Initial vitals: Blood pressure 154/90, pulse of 112, respirations of 16, temperature 98.2  Pertinent labs: Lactic acid 3.1, sodium 132, chloride 97, blood glucose of 234, calcium 8.5, albumin 2.6, alkaline phosphate of 231, WBCs of 18.8, hemoglobin of 11.2, hematocrit 35.3, platelets of 590  Imaging CT abdomen and pelvis revealed inflammatory changes involving the right kidney with poor enhancement with multiple fluid collections consistent with abscess formation and underlying pyelonephritis, nonobstructing right renal stone  Cardiology was consulted for abnormal EKG with a new finding of atrial fibrillation/atrial flutter  Past Medical History:  Diagnosis Date   Diabetes mellitus    Pyelonephritis     Past Surgical History:  Procedure Laterality Date   CHOLECYSTECTOMY     EYE SURGERY Bilateral    TUBAL LIGATION Bilateral      Home Medications:  Prior to Admission medications   Medication Sig Start Date End Date Taking? Authorizing Provider  Aspirin-Caffeine (BAYER BACK & BODY PO) Take 1 tablet by mouth daily as needed (pain).   Yes [provider]  Azelastine HCl 137 MCG/SPRAY SOLN Place 2 sprays into both nostrils 2 (two) times daily. 05/13/22  Yes [provider]  fenofibrate micronized (LOFIBRA) 200 MG capsule Take 200 mg by mouth daily. 05/03/22  Yes [provider]  gabapentin (NEURONTIN) 600 MG tablet Take 600 mg by mouth at bedtime. 01/30/21  Yes [provider]  HYDROcodone-acetaminophen (NORCO) 5-325 MG tablet Take 1 tablet by mouth every 4 (four) hours as needed for moderate pain. 05/17/22  Yes Duanne Guess, PA-C  ibuprofen (ADVIL)  800 MG tablet Take 800 mg by mouth 3 (three) times daily as needed. 05/03/22  Yes [provider]  insulin NPH (HUMULIN N,NOVOLIN N) 100 UNIT/ML injection Inject 18-22 Units into the skin 2 (two) times daily before a meal. Per patients sliding scale: breakfast - 20-22 units,  supper 18-20  units   Yes [provider]  insulin regular (NOVOLIN R,HUMULIN R) 100 units/mL injection Inject 18-24 Units into the skin 2 (two) times daily before a meal. Per patients sliding scale: breakfast 20-24 units, supper 18-20   Yes [provider]  LUTEIN PO Take 1 capsule by mouth 2 (two) times daily.   Yes [provider]  Multiple Vitamin (MULTIVITAMIN WITH MINERALS) TABS Take 1 tablet by mouth daily.   Yes [provider]  Multiple Vitamins-Minerals (AIRBORNE PO) Take 1 tablet by mouth daily.   Yes [provider]  naproxen sodium (ALEVE) 220 MG tablet Take 880 mg by mouth 2 (two) times daily as needed (pain).   Yes [provider]  ondansetron (ZOFRAN-ODT) 4 MG disintegrating tablet Take 1 tablet (4 mg total) by mouth every 8 (eight) hours as needed for nausea or vomiting. 05/17/22  Yes Duanne Guess, PA-C  ondansetron (ZOFRAN-ODT) 8 MG disintegrating tablet Take 8 mg by mouth every 8 (eight) hours as needed. 05/13/22  Yes [provider]  sulfamethoxazole-trimethoprim (BACTRIM DS) 800-160 MG tablet Take 1 tablet by mouth 2 (two) times daily for 10 days. 05/17/22 05/27/22 Yes Duanne Guess, PA-C  SUMAtriptan (IMITREX) 100 MG tablet Take 100 mg by mouth every 2 (two) hours as needed. 05/03/22  Yes [provider]  Vitamin D, Ergocalciferol, (DRISDOL) 1.25 MG (50000 UNIT) CAPS capsule Take 50,000 Units by mouth once a week. 05/03/22  Yes [provider]  cefdinir (OMNICEF) 300 MG capsule Take 1 capsule (300 mg total) by mouth 2 (two) times daily. Patient not taking: Reported on 05/24/2022 03/09/21   Letitia Neri L, PA-C  potassium chloride SA (KLOR-CON M) 20 MEQ tablet Take 1 tablet (20 mEq total) by mouth daily. Patient not taking: Reported on 05/24/2022 03/09/21   Johnn Hai, PA-C    Inpatient Medications: Scheduled Meds:  fenofibrate  160 mg Oral Daily   gabapentin  600 mg Oral QHS   insulin aspart  0-5 Units  Subcutaneous QHS   insulin aspart  0-9 Units Subcutaneous TID WC   insulin NPH Human  20 Units Subcutaneous BID AC   metoprolol tartrate  12.5 mg Oral BID   Continuous Infusions:  sodium chloride     ceFEPime (MAXIPIME) IV Stopped (05/25/22 0445)   sodium chloride Stopped (05/24/22 1744)   PRN Meds: acetaminophen, metoprolol tartrate, morphine injection, ondansetron (ZOFRAN) IV, oxyCODONE-acetaminophen, SUMAtriptan  Allergies:   No Known Allergies  Social History:   Social History   Socioeconomic History   Marital status: Married    Spouse name: Not on file   Number of children: Not on file   Years of education: Not on file   Highest education level: Not on file  Occupational History   Not on file  Tobacco Use   Smoking status: Former    Types: Cigarettes   Smokeless tobacco: Never  Vaping Use   Vaping Use: Never used  Substance and Sexual Activity   Alcohol use: No   Drug use: Never   Sexual activity: Not on file  Other Topics Concern   Not on file  Social History Narrative   Not on file  Social Determinants of Health   Financial Resource Strain: Not on file  Food Insecurity: No Food Insecurity (05/24/2022)   Hunger Vital Sign    Worried About Running Out of Food in the Last Year: Never true    Ran Out of Food in the Last Year: Never true  Transportation Needs: No Transportation Needs (05/24/2022)   PRAPARE - Hydrologist (Medical): No    Lack of Transportation (Non-Medical): No  Physical Activity: Not on file  Stress: Not on file  Social Connections: Not on file  Intimate Partner Violence: Not At Risk (05/24/2022)   Humiliation, Afraid, Rape, and Kick questionnaire    Fear of Current or Ex-Partner: No    Emotionally Abused: No    Physically Abused: No    Sexually Abused: No    Family History:    Family History  Problem Relation Age of Onset   Kidney disease Sister    Colon cancer Maternal Aunt      ROS:  Please see the  history of present illness.  Review of Systems  Constitutional:  Positive for chills, fever and malaise/fatigue.  Genitourinary:  Positive for flank pain, frequency and urgency.  Neurological:  Positive for weakness.    All other ROS reviewed and negative.     Physical Exam/Data:   Vitals:   05/24/22 1645 05/24/22 1914 05/25/22 0629 05/25/22 0755  BP: 115/71 (!) 120/58 118/65 139/75  Pulse: 98 (!) 108 (!) 110 (!) 106  Resp: '18 16 18   '$ Temp: 98.6 F (37 C) 98.6 F (37 C) 98.4 F (36.9 C) 99.7 F (37.6 C)  TempSrc: Oral Oral Oral Oral  SpO2: 100% 97% 97% 98%  Weight:      Height:        Intake/Output Summary (Last 24 hours) at 05/25/2022 0810 Last data filed at 05/25/2022 0610 Gross per 24 hour  Intake 2834.8 ml  Output --  Net 2834.8 ml      05/24/2022   12:55 PM 05/17/2022    5:33 PM 03/09/2021    7:59 AM  Last 3 Weights  Weight (lbs) 222 lb 222 lb 215 lb  Weight (kg) 100.699 kg 100.699 kg 97.523 kg     Body mass index is 39.33 kg/m.  General:  Well nourished, well developed, in no acute distress HEENT: normal Neck: no JVD Vascular: No carotid bruits; Distal pulses 2+ bilaterally Cardiac:  normal S1, S2; RRR; no murmur  Lungs:  clear to auscultation bilaterally, no wheezing, rhonchi or rales, respirations are unlabored on room air Abd: soft, nontender, no hepatomegaly, Rt CVA tenderness Ext: no edema Musculoskeletal:  No deformities, BUE and BLE strength normal and equal Skin: warm and dry  Neuro:  CNs 2-12 intact, no focal abnormalities noted Psych:  Normal affect   EKG:  The EKG was personally reviewed and demonstrates:  atypical atrial flutter, self terminating into sinus with PAC's Telemetry:  Telemetry was personally reviewed and demonstrates:  sinus rates in the 90's, converted from atrial fibrillation to sinus at 0656  Relevant CV Studies: Echocardiogram ordered and pending  Laboratory Data:  High Sensitivity Troponin:   Recent Labs  Lab  05/24/22 1924 05/24/22 2128  TROPONINIHS 5 5     Chemistry Recent Labs  Lab 05/24/22 1306 05/25/22 0451  NA 132* 131*  K 3.9 4.0  CL 97* 100  CO2 23 22  GLUCOSE 234* 343*  BUN 7 6  CREATININE 0.82 0.78  CALCIUM 8.5* 8.0*  GFRNONAA >  60 >60  ANIONGAP 12 9    Recent Labs  Lab 05/24/22 1306  PROT 7.7  ALBUMIN 2.6*  AST 23  ALT 17  ALKPHOS 231*  BILITOT 0.5   Lipids No results for input(s): "CHOL", "TRIG", "HDL", "LABVLDL", "LDLCALC", "CHOLHDL" in the last 168 hours.  Hematology Recent Labs  Lab 05/24/22 1306 05/25/22 0451  WBC 18.8* 15.2*  RBC 3.70* 3.11*  HGB 11.2* 9.2*  HCT 35.3* 29.2*  MCV 95.4 93.9  MCH 30.3 29.6  MCHC 31.7 31.5  RDW 13.2 13.2  PLT 590* 524*   Thyroid  Recent Labs  Lab 05/24/22 1306  TSH 1.001    BNPNo results for input(s): "BNP", "PROBNP" in the last 168 hours.  DDimer No results for input(s): "DDIMER" in the last 168 hours.   Radiology/Studies:  CT ABDOMEN PELVIS W CONTRAST  Result Date: 05/24/2022 CLINICAL DATA:  Renal abscess.  Sepsis.  Recent UTI. EXAM: CT ABDOMEN AND PELVIS WITH CONTRAST TECHNIQUE: Multidetector CT imaging of the abdomen and pelvis was performed using the standard protocol following bolus administration of intravenous contrast. RADIATION DOSE REDUCTION: This exam was performed according to the departmental dose-optimization program which includes automated exposure control, adjustment of the mA and/or kV according to patient size and/or use of iterative reconstruction technique. CONTRAST:  115m OMNIPAQUE IOHEXOL 300 MG/ML  SOLN COMPARISON:  Noncontrast CT 05/24/2022 earlier FINDINGS: Lower chest: Minimal basilar scar or atelectasis. No pleural effusion. There is possible enlarged node to the right of the esophagus at the edge of the imaging field on series 2, image 1 measuring 13 mm. Hepatobiliary: No space-occupying liver lesion. Patent portal vein. Previous cholecystectomy. Pancreas: Mild pancreatic atrophy without  mass. Spleen: Borderline enlarged spleen at 13 cm. Adrenals/Urinary Tract: Adrenal glands are preserved. No enhancing lesion in the left kidney. No collecting system dilatation. Once again there is a 3 mm stone lower pole of the right kidney. There is enlargement of the right kidney with severe perinephric stranding and thickening. There are several areas of poor enhancement along the mid to upper aspect of the right kidney with several fluid collections. Largest is seen superiorly and lateral on series 2 image 32 measuring 3.5 by 3.0 cm in the axial plane. Cephalocaudal 3.1 cm. There are additional foci extending along the midportion of the kidney laterally which is multiloculated. On axial image 40 of series 2 3.2 by 2.1 by 3.8 cm. This may extend subcapsular as well. Additional smaller foci slightly above this in addition. Overall cephalocaudal extent a proximally 7.3 cm. These are worrisome for abscess formation. No collecting system dilatation of the right kidney. Preserved contours of the urinary bladder. Stomach/Bowel: Moderate colonic stool. Large bowel is of normal course and caliber normal appendix. Small bowel is nondilated. Stomach is nondilated. Vascular/Lymphatic: Normal caliber aorta and IVC. There are several small nodes identified in the retroperitoneum. These are less than a cm in short axis and not pathologic by size criteria. These could be reactive. Reproductive: Uterus and bilateral adnexa are unremarkable. Other: No free air. No ascites. Small fat containing umbilical hernia. Musculoskeletal: Mild degenerative changes along the spine with some curvature. IMPRESSION: Inflammatory changes involving the right kidney with poor enhancement and multiple fluid collections consistent with abscess formation and underlying pyelonephritis. Nonobstructing right-sided renal stone. Critical Value/emergent results were called by telephone at the time of interpretation on 05/24/2022 at 12:30 pm to provider  SAshok Cordia, who verbally acknowledged these results. Electronically Signed   By: ALarose HiresD.  On: 05/24/2022 15:32   CT Renal Stone Study  Result Date: 05/24/2022 CLINICAL DATA:  Flank pain. EXAM: CT ABDOMEN AND PELVIS WITHOUT CONTRAST TECHNIQUE: Multidetector CT imaging of the abdomen and pelvis was performed following the standard protocol without IV contrast. RADIATION DOSE REDUCTION: This exam was performed according to the departmental dose-optimization program which includes automated exposure control, adjustment of the mA and/or kV according to patient size and/or use of iterative reconstruction technique. COMPARISON:  CT 05/17/2022 and older FINDINGS: Lower chest: There is some breathing motion along the lung bases. Mild scar or atelectasis. No pleural effusion. These areas are improving from previous. Hepatobiliary: On this non IV contrast exam, grossly the liver is preserved. Previous cholecystectomy. Pancreas: Global pancreatic atrophy without obvious mass lesion or ductal dilatation. Spleen: Once again borderline enlarged spleen measuring 13 cm in cephalocaudal length. Adrenals/Urinary Tract: Adrenal glands are preserved. No abnormal calcification in the left kidney nor along the course of the left ureter. Once again there is enlargement of the right kidney with stranding of the perinephric space. The amount of stranding is increasing. There is a nonobstructing stone in the lower pole of the right kidney. No right ureteral stone. There are developing cystic areas identified as well at this time towards the upper aspect of the right kidney. These are somewhat ill-defined on this noncontrast exam but for example area on series 2, image 28 measures 3.5 by 3.7 by 3.1 cm. Additional smaller area more caudal as seen on axial image 35 of series 2 measuring 2.8 by 2.4 cm. With the provided history of pyelonephritis, developing abscess or phlegmonous changes possible recommend further evaluation. A  contrast study may be of some benefit if the patient is clinically able to further define these areas. Stomach/Bowel: Large bowel has a normal course and caliber with mild scattered stool. Normal appendix. The stomach and small bowel are nondilated. Vascular/Lymphatic: Normal caliber aorta and IVC. There are some small retroperitoneal nodes identified which are not pathologic by size criteria. Reproductive: Uterus and bilateral adnexa are unremarkable. Other: No ascites or free air. Musculoskeletal: Mild curvature of the spine. Minimal degenerative change. IMPRESSION: 3 mm nonobstructing lower pole right-sided renal stone again seen. Increasing perinephric stranding with increasing heterogeneous appearance of the renal parenchyma with developing ill-defined low-density areas superior laterally. No associated air. With patient's history of infection, developing abscess or phlegmonous changes possible. These areas could be defined further within IV contrast examination if clinically able. Electronically Signed   By: Jill Side M.D.   On: 05/24/2022 14:15     Assessment and Plan:  Atrial fibrillation and multiple atrial flutters typical and atypical (but not reverse typical) rapid ventricular response  Sepsis secondary renal abscess elevated lactate  Diabetes   Atrial fibrillation is recurring (not on telemetry yet but will replace) as ECG is not showing recurrent atrial arrhythmia with rapid rates.  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  This may further inform anticoagulation recommendations but only basis of her diabetes only at her young age I would not recommend anticoagulation long-term and indeed does not need to be on anticoagulation now as she does not need cardioversion as her atrial episodes are intermittent and self terminating.  Continue with metoprolol for rate control       Risk  Assessment/Risk Scores:          CHA2DS2-VASc Score = 2   This indicates a 2.2%  annual risk of stroke. The patient's score is based upon: CHF History: 0 HTN History: 0 Diabetes History: 1 Stroke History: 0 Vascular Disease History: 0 Age Score: 0 Gender Score: 1          For questions or updates, please contact [place] Please consult [email or web address] for contact info under    Signed, SHERI HAMMOCK, NP  05/25/2022 8:10 AM

## 2022-05-25 NOTE — Progress Notes (Signed)
PHARMACIST - PHYSICIAN COMMUNICATION  CONCERNING:  Enoxaparin (Lovenox) for DVT Prophylaxis    RECOMMENDATION: Patient was prescribed enoxaprin '40mg'$  q24 hours for VTE prophylaxis.   Filed Weights   05/24/22 1255  Weight: 100.7 kg (222 lb)    Body mass index is 39.33 kg/m.  Estimated Creatinine Clearance: 105.8 mL/min (by C-G formula based on SCr of 0.78 mg/dL).   Based on [place] patient is candidate for enoxaparin 0.'5mg'$ /kg TBW SQ every 24 hours based on BMI being >30.   DESCRIPTION: Pharmacy has adjusted enoxaparin dose per [facility] policy.  Patient is now receiving enoxaparin 50 mg every 24 hours    Pernell Dupre, PharmD Clinical Pharmacist  05/25/2022 8:40 AM

## 2022-05-25 NOTE — Consult Note (Cosign Needed)
Cardiology Consultation   Patient ID: Katie Hampton MRN: [id number]; DOB: 16-Oct-1980  Admit date: 05/24/2022 Date of Consult: 05/25/2022  PCP:  Candida Peeling, [facility] Cardiologist:  None        Patient Profile:   Katie Hampton is a 42 y.o. female with a hx of diabetes, pyelonephritis, recurrent UTIs, and kidney stones who is being seen 05/25/2022 for the evaluation of new onset atrial fibrillation/atrial flutter at the request of Dr. Blaine Hamper.  History of Present Illness:   Ms. [name] is a 42 year old female with a history of diabetes, frequent UTIs, pyelonephritis, and history of kidney stones who was just recently evaluated in the emergency department on 05/17/22 for the evaluation of right-sided flank pain, dysuria, and increased urinary frequency.  5 days ago she developed symptoms of dysuria and then increased urinary frequency.  A few days later she was seen at the [facility] in [facility] and diagnosed with a UTI and placed on Cipro.  There was no improvement over the last couple days and her symptoms while being on antibiotics.  She had subjective fevers, no nausea or vomiting on continues to have right-sided flank pain with urinary discomfort.  She was given 1 L of fluids, vital signs remained stable, CT scan obtained showed renal stone but no ureteral stones, her antibiotics were changed and she was subsequently discharged from the ER.  She returned to the [facility] emergency department on 05/24/2022 with complaints of fever, low back pain, and nausea.  She was seen in the emergency department on the previous Friday and was given a dose of IV Rocephin and had been taking the Bactrim since that time.  Unfortunately she had had no improvement in symptoms and return to the emergency department with the same complaints and concerns.  She denied any other symptoms of chest pain, shortness of breath, palpitations, peripheral edema, lightheadedness or  dizziness.  Initial vitals: Blood pressure 154/90, pulse of 112, respirations of 16, temperature 98.2  Pertinent labs: Lactic acid 3.1, sodium 132, chloride 97, blood glucose of 234, calcium 8.5, albumin 2.6, alkaline phosphate of 231, WBCs of 18.8, hemoglobin of 11.2, hematocrit 35.3, platelets of 590  Imaging CT abdomen and pelvis revealed inflammatory changes involving the right kidney with poor enhancement with multiple fluid collections consistent with abscess formation and underlying pyelonephritis, nonobstructing right renal stone  Cardiology was consulted for abnormal EKG with a new finding of atrial fibrillation/atrial flutter  Past Medical History:  Diagnosis Date   Diabetes mellitus    Pyelonephritis     Past Surgical History:  Procedure Laterality Date   CHOLECYSTECTOMY     EYE SURGERY Bilateral    TUBAL LIGATION Bilateral      Home Medications:  Prior to Admission medications   Medication Sig Start Date End Date Taking? Authorizing Provider  Aspirin-Caffeine (BAYER BACK & BODY PO) Take 1 tablet by mouth daily as needed (pain).   Yes [provider]  Azelastine HCl 137 MCG/SPRAY SOLN Place 2 sprays into both nostrils 2 (two) times daily. 05/13/22  Yes [provider]  fenofibrate micronized (LOFIBRA) 200 MG capsule Take 200 mg by mouth daily. 05/03/22  Yes [provider]  gabapentin (NEURONTIN) 600 MG tablet Take 600 mg by mouth at bedtime. 01/30/21  Yes [provider]  HYDROcodone-acetaminophen (NORCO) 5-325 MG tablet Take 1 tablet by mouth every 4 (four) hours as needed for moderate pain. 05/17/22  Yes Duanne Guess, PA-C  ibuprofen (ADVIL)  800 MG tablet Take 800 mg by mouth 3 (three) times daily as needed. 05/03/22  Yes [provider]  insulin NPH (HUMULIN N,NOVOLIN N) 100 UNIT/ML injection Inject 18-22 Units into the skin 2 (two) times daily before a meal. Per patients sliding scale: breakfast - 20-22 units,  supper 18-20  units   Yes [provider]  insulin regular (NOVOLIN R,HUMULIN R) 100 units/mL injection Inject 18-24 Units into the skin 2 (two) times daily before a meal. Per patients sliding scale: breakfast 20-24 units, supper 18-20   Yes [provider]  LUTEIN PO Take 1 capsule by mouth 2 (two) times daily.   Yes [provider]  Multiple Vitamin (MULTIVITAMIN WITH MINERALS) TABS Take 1 tablet by mouth daily.   Yes [provider]  Multiple Vitamins-Minerals (AIRBORNE PO) Take 1 tablet by mouth daily.   Yes [provider]  naproxen sodium (ALEVE) 220 MG tablet Take 880 mg by mouth 2 (two) times daily as needed (pain).   Yes [provider]  ondansetron (ZOFRAN-ODT) 4 MG disintegrating tablet Take 1 tablet (4 mg total) by mouth every 8 (eight) hours as needed for nausea or vomiting. 05/17/22  Yes Duanne Guess, PA-C  ondansetron (ZOFRAN-ODT) 8 MG disintegrating tablet Take 8 mg by mouth every 8 (eight) hours as needed. 05/13/22  Yes [provider]  sulfamethoxazole-trimethoprim (BACTRIM DS) 800-160 MG tablet Take 1 tablet by mouth 2 (two) times daily for 10 days. 05/17/22 05/27/22 Yes Duanne Guess, PA-C  SUMAtriptan (IMITREX) 100 MG tablet Take 100 mg by mouth every 2 (two) hours as needed. 05/03/22  Yes [provider]  Vitamin D, Ergocalciferol, (DRISDOL) 1.25 MG (50000 UNIT) CAPS capsule Take 50,000 Units by mouth once a week. 05/03/22  Yes [provider]  cefdinir (OMNICEF) 300 MG capsule Take 1 capsule (300 mg total) by mouth 2 (two) times daily. Patient not taking: Reported on 05/24/2022 03/09/21   Letitia Neri L, PA-C  potassium chloride SA (KLOR-CON M) 20 MEQ tablet Take 1 tablet (20 mEq total) by mouth daily. Patient not taking: Reported on 05/24/2022 03/09/21   Johnn Hai, PA-C    Inpatient Medications: Scheduled Meds:  fenofibrate  160 mg Oral Daily   gabapentin  600 mg Oral QHS   insulin aspart  0-5 Units  Subcutaneous QHS   insulin aspart  0-9 Units Subcutaneous TID WC   insulin NPH Human  20 Units Subcutaneous BID AC   metoprolol tartrate  12.5 mg Oral BID   Continuous Infusions:  sodium chloride     ceFEPime (MAXIPIME) IV Stopped (05/25/22 0445)   sodium chloride Stopped (05/24/22 1744)   PRN Meds: acetaminophen, metoprolol tartrate, morphine injection, ondansetron (ZOFRAN) IV, oxyCODONE-acetaminophen, SUMAtriptan  Allergies:   No Known Allergies  Social History:   Social History   Socioeconomic History   Marital status: Married    Spouse name: Not on file   Number of children: Not on file   Years of education: Not on file   Highest education level: Not on file  Occupational History   Not on file  Tobacco Use   Smoking status: Former    Types: Cigarettes   Smokeless tobacco: Never  Vaping Use   Vaping Use: Never used  Substance and Sexual Activity   Alcohol use: No   Drug use: Never   Sexual activity: Not on file  Other Topics Concern   Not on file  Social History Narrative   Not on file  Social Determinants of Health   Financial Resource Strain: Not on file  Food Insecurity: No Food Insecurity (05/24/2022)   Hunger Vital Sign    Worried About Running Out of Food in the Last Year: Never true    Ran Out of Food in the Last Year: Never true  Transportation Needs: No Transportation Needs (05/24/2022)   PRAPARE - Hydrologist (Medical): No    Lack of Transportation (Non-Medical): No  Physical Activity: Not on file  Stress: Not on file  Social Connections: Not on file  Intimate Partner Violence: Not At Risk (05/24/2022)   Humiliation, Afraid, Rape, and Kick questionnaire    Fear of Current or Ex-Partner: No    Emotionally Abused: No    Physically Abused: No    Sexually Abused: No    Family History:    Family History  Problem Relation Age of Onset   Kidney disease Sister    Colon cancer Maternal Aunt      ROS:  Please see the  history of present illness.  Review of Systems  Constitutional:  Positive for chills, fever and malaise/fatigue.  Genitourinary:  Positive for flank pain, frequency and urgency.  Neurological:  Positive for weakness.    All other ROS reviewed and negative.     Physical Exam/Data:   Vitals:   05/24/22 1645 05/24/22 1914 05/25/22 0629 05/25/22 0755  BP: 115/71 (!) 120/58 118/65 139/75  Pulse: 98 (!) 108 (!) 110 (!) 106  Resp: '18 16 18   '$ Temp: 98.6 F (37 C) 98.6 F (37 C) 98.4 F (36.9 C) 99.7 F (37.6 C)  TempSrc: Oral Oral Oral Oral  SpO2: 100% 97% 97% 98%  Weight:      Height:        Intake/Output Summary (Last 24 hours) at 05/25/2022 0810 Last data filed at 05/25/2022 0610 Gross per 24 hour  Intake 2834.8 ml  Output --  Net 2834.8 ml      05/24/2022   12:55 PM 05/17/2022    5:33 PM 03/09/2021    7:59 AM  Last 3 Weights  Weight (lbs) 222 lb 222 lb 215 lb  Weight (kg) 100.699 kg 100.699 kg 97.523 kg     Body mass index is 39.33 kg/m.  General:  Well nourished, well developed, in no acute distress HEENT: normal Neck: no JVD Vascular: No carotid bruits; Distal pulses 2+ bilaterally Cardiac:  normal S1, S2; RRR; no murmur  Lungs:  clear to auscultation bilaterally, no wheezing, rhonchi or rales, respirations are unlabored on room air Abd: soft, nontender, no hepatomegaly, Rt CVA tenderness Ext: no edema Musculoskeletal:  No deformities, BUE and BLE strength normal and equal Skin: warm and dry  Neuro:  CNs 2-12 intact, no focal abnormalities noted Psych:  Normal affect   EKG:  The EKG was personally reviewed and demonstrates:  atypical atrial flutter, self terminating into sinus with PAC's Telemetry:  Telemetry was personally reviewed and demonstrates:  sinus rates in the 90's, converted from atrial fibrillation to sinus at 0656  Relevant CV Studies: Echocardiogram ordered and pending  Laboratory Data:  High Sensitivity Troponin:   Recent Labs  Lab  05/24/22 1924 05/24/22 2128  TROPONINIHS 5 5     Chemistry Recent Labs  Lab 05/24/22 1306 05/25/22 0451  NA 132* 131*  K 3.9 4.0  CL 97* 100  CO2 23 22  GLUCOSE 234* 343*  BUN 7 6  CREATININE 0.82 0.78  CALCIUM 8.5* 8.0*  GFRNONAA >  60 >60  ANIONGAP 12 9    Recent Labs  Lab 05/24/22 1306  PROT 7.7  ALBUMIN 2.6*  AST 23  ALT 17  ALKPHOS 231*  BILITOT 0.5   Lipids No results for input(s): "CHOL", "TRIG", "HDL", "LABVLDL", "LDLCALC", "CHOLHDL" in the last 168 hours.  Hematology Recent Labs  Lab 05/24/22 1306 05/25/22 0451  WBC 18.8* 15.2*  RBC 3.70* 3.11*  HGB 11.2* 9.2*  HCT 35.3* 29.2*  MCV 95.4 93.9  MCH 30.3 29.6  MCHC 31.7 31.5  RDW 13.2 13.2  PLT 590* 524*   Thyroid  Recent Labs  Lab 05/24/22 1306  TSH 1.001    BNPNo results for input(s): "BNP", "PROBNP" in the last 168 hours.  DDimer No results for input(s): "DDIMER" in the last 168 hours.   Radiology/Studies:  CT ABDOMEN PELVIS W CONTRAST  Result Date: 05/24/2022 CLINICAL DATA:  Renal abscess.  Sepsis.  Recent UTI. EXAM: CT ABDOMEN AND PELVIS WITH CONTRAST TECHNIQUE: Multidetector CT imaging of the abdomen and pelvis was performed using the standard protocol following bolus administration of intravenous contrast. RADIATION DOSE REDUCTION: This exam was performed according to the departmental dose-optimization program which includes automated exposure control, adjustment of the mA and/or kV according to patient size and/or use of iterative reconstruction technique. CONTRAST:  152m OMNIPAQUE IOHEXOL 300 MG/ML  SOLN COMPARISON:  Noncontrast CT 05/24/2022 earlier FINDINGS: Lower chest: Minimal basilar scar or atelectasis. No pleural effusion. There is possible enlarged node to the right of the esophagus at the edge of the imaging field on series 2, image 1 measuring 13 mm. Hepatobiliary: No space-occupying liver lesion. Patent portal vein. Previous cholecystectomy. Pancreas: Mild pancreatic atrophy without  mass. Spleen: Borderline enlarged spleen at 13 cm. Adrenals/Urinary Tract: Adrenal glands are preserved. No enhancing lesion in the left kidney. No collecting system dilatation. Once again there is a 3 mm stone lower pole of the right kidney. There is enlargement of the right kidney with severe perinephric stranding and thickening. There are several areas of poor enhancement along the mid to upper aspect of the right kidney with several fluid collections. Largest is seen superiorly and lateral on series 2 image 32 measuring 3.5 by 3.0 cm in the axial plane. Cephalocaudal 3.1 cm. There are additional foci extending along the midportion of the kidney laterally which is multiloculated. On axial image 40 of series 2 3.2 by 2.1 by 3.8 cm. This may extend subcapsular as well. Additional smaller foci slightly above this in addition. Overall cephalocaudal extent a proximally 7.3 cm. These are worrisome for abscess formation. No collecting system dilatation of the right kidney. Preserved contours of the urinary bladder. Stomach/Bowel: Moderate colonic stool. Large bowel is of normal course and caliber normal appendix. Small bowel is nondilated. Stomach is nondilated. Vascular/Lymphatic: Normal caliber aorta and IVC. There are several small nodes identified in the retroperitoneum. These are less than a cm in short axis and not pathologic by size criteria. These could be reactive. Reproductive: Uterus and bilateral adnexa are unremarkable. Other: No free air. No ascites. Small fat containing umbilical hernia. Musculoskeletal: Mild degenerative changes along the spine with some curvature. IMPRESSION: Inflammatory changes involving the right kidney with poor enhancement and multiple fluid collections consistent with abscess formation and underlying pyelonephritis. Nonobstructing right-sided renal stone. Critical Value/emergent results were called by telephone at the time of interpretation on 05/24/2022 at 12:30 pm to provider  SAshok Cordia, who verbally acknowledged these results. Electronically Signed   By: ALarose HiresD.  On: 05/24/2022 15:32   CT Renal Stone Study  Result Date: 05/24/2022 CLINICAL DATA:  Flank pain. EXAM: CT ABDOMEN AND PELVIS WITHOUT CONTRAST TECHNIQUE: Multidetector CT imaging of the abdomen and pelvis was performed following the standard protocol without IV contrast. RADIATION DOSE REDUCTION: This exam was performed according to the departmental dose-optimization program which includes automated exposure control, adjustment of the mA and/or kV according to patient size and/or use of iterative reconstruction technique. COMPARISON:  CT 05/17/2022 and older FINDINGS: Lower chest: There is some breathing motion along the lung bases. Mild scar or atelectasis. No pleural effusion. These areas are improving from previous. Hepatobiliary: On this non IV contrast exam, grossly the liver is preserved. Previous cholecystectomy. Pancreas: Global pancreatic atrophy without obvious mass lesion or ductal dilatation. Spleen: Once again borderline enlarged spleen measuring 13 cm in cephalocaudal length. Adrenals/Urinary Tract: Adrenal glands are preserved. No abnormal calcification in the left kidney nor along the course of the left ureter. Once again there is enlargement of the right kidney with stranding of the perinephric space. The amount of stranding is increasing. There is a nonobstructing stone in the lower pole of the right kidney. No right ureteral stone. There are developing cystic areas identified as well at this time towards the upper aspect of the right kidney. These are somewhat ill-defined on this noncontrast exam but for example area on series 2, image 28 measures 3.5 by 3.7 by 3.1 cm. Additional smaller area more caudal as seen on axial image 35 of series 2 measuring 2.8 by 2.4 cm. With the provided history of pyelonephritis, developing abscess or phlegmonous changes possible recommend further evaluation. A  contrast study may be of some benefit if the patient is clinically able to further define these areas. Stomach/Bowel: Large bowel has a normal course and caliber with mild scattered stool. Normal appendix. The stomach and small bowel are nondilated. Vascular/Lymphatic: Normal caliber aorta and IVC. There are some small retroperitoneal nodes identified which are not pathologic by size criteria. Reproductive: Uterus and bilateral adnexa are unremarkable. Other: No ascites or free air. Musculoskeletal: Mild curvature of the spine. Minimal degenerative change. IMPRESSION: 3 mm nonobstructing lower pole right-sided renal stone again seen. Increasing perinephric stranding with increasing heterogeneous appearance of the renal parenchyma with developing ill-defined low-density areas superior laterally. No associated air. With patient's history of infection, developing abscess or phlegmonous changes possible. These areas could be defined further within IV contrast examination if clinically able. Electronically Signed   By: Jill Side M.D.   On: 05/24/2022 14:15     Assessment and Plan:  Atrial fibrillation and multiple atrial flutters typical and atypical (but not reverse typical) rapid ventricular response  Sepsis secondary renal abscess elevated lactate  Diabetes   Atrial fibrillation is recurring (not on telemetry yet but will replace) as ECG is not showing recurrent atrial arrhythmia with rapid rates.  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  This may further inform anticoagulation recommendations but only basis of her diabetes only at her young age I would not recommend anticoagulation long-term and indeed does not need to be on anticoagulation now as she does not need cardioversion as her atrial episodes are intermittent and self terminating.  Continue with metoprolol for rate control       Risk  Assessment/Risk Scores:          CHA2DS2-VASc Score = 2   This indicates a 2.2%  annual risk of stroke. The patient's score is based upon: CHF History: 0 HTN History: 0 Diabetes History: 1 Stroke History: 0 Vascular Disease History: 0 Age Score: 0 Gender Score: 1          For questions or updates, please contact [place] Please consult [email or web address] for contact info under    Signed, Malayjah Otoole, NP  05/25/2022 8:10 AM

## 2022-05-25 NOTE — Progress Notes (Signed)
[facility] [place] NAME: Katie Hampton    MR#:  [id number]  DATE OF BIRTH:  07/24/1980  SUBJECTIVE:      VITALS:  Blood pressure 139/75, pulse (!) 106, temperature 98.8 F (37.1 C), temperature source Oral, resp. rate 18, height '5\' 3"'$  (1.6 m), weight 100.7 kg, last menstrual period 05/17/2022, SpO2 98 %.  PHYSICAL EXAMINATION:   GENERAL:  42 y.o.-year-old patient with no acute distress.  LUNGS: Normal breath sounds bilaterally, no wheezing CARDIOVASCULAR: S1, S2 normal. No murmur   ABDOMEN: Soft, nontender, nondistended. Bowel sounds present.  EXTREMITIES: No  edema b/l.    NEUROLOGIC: nonfocal  patient is alert and awake SKIN: No obvious rash, lesion, or ulcer.   LABORATORY PANEL:  CBC Recent Labs  Lab 05/25/22 0451  WBC 15.2*  HGB 9.2*  HCT 29.2*  PLT 524*    Chemistries  Recent Labs  Lab 05/24/22 1306 05/25/22 0451  NA 132* 131*  K 3.9 4.0  CL 97* 100  CO2 23 22  GLUCOSE 234* 343*  BUN 7 6  CREATININE 0.82 0.78  CALCIUM 8.5* 8.0*  AST 23  --   ALT 17  --   ALKPHOS 231*  --   BILITOT 0.5  --    Cardiac Enzymes No results for input(s): "TROPONINI" in the last 168 hours. RADIOLOGY:  CT ABDOMEN PELVIS W CONTRAST  Result Date: 05/24/2022 CLINICAL DATA:  Renal abscess.  Sepsis.  Recent UTI. EXAM: CT ABDOMEN AND PELVIS WITH CONTRAST TECHNIQUE: Multidetector CT imaging of the abdomen and pelvis was performed using the standard protocol following bolus administration of intravenous contrast. RADIATION DOSE REDUCTION: This exam was performed according to the departmental dose-optimization program which includes automated exposure control, adjustment of the mA and/or kV according to patient size and/or use of iterative reconstruction technique. CONTRAST:  197m OMNIPAQUE IOHEXOL 300 MG/ML  SOLN COMPARISON:  Noncontrast CT 05/24/2022 earlier FINDINGS: Lower chest: Minimal basilar scar or atelectasis. No pleural  effusion. There is possible enlarged node to the right of the esophagus at the edge of the imaging field on series 2, image 1 measuring 13 mm. Hepatobiliary: No space-occupying liver lesion. Patent portal vein. Previous cholecystectomy. Pancreas: Mild pancreatic atrophy without mass. Spleen: Borderline enlarged spleen at 13 cm. Adrenals/Urinary Tract: Adrenal glands are preserved. No enhancing lesion in the left kidney. No collecting system dilatation. Once again there is a 3 mm stone lower pole of the right kidney. There is enlargement of the right kidney with severe perinephric stranding and thickening. There are several areas of poor enhancement along the mid to upper aspect of the right kidney with several fluid collections. Largest is seen superiorly and lateral on series 2 image 32 measuring 3.5 by 3.0 cm in the axial plane. Cephalocaudal 3.1 cm. There are additional foci extending along the midportion of the kidney laterally which is multiloculated. On axial image 40 of series 2 3.2 by 2.1 by 3.8 cm. This may extend subcapsular as well. Additional smaller foci slightly above this in addition. Overall cephalocaudal extent a proximally 7.3 cm. These are worrisome for abscess formation. No collecting system dilatation of the right kidney. Preserved contours of the urinary bladder. Stomach/Bowel: Moderate colonic stool. Large bowel is of normal course and caliber normal appendix. Small bowel is nondilated. Stomach is nondilated. Vascular/Lymphatic: Normal caliber aorta and IVC. There are several small nodes identified in the retroperitoneum. These are less than a cm in short axis and not pathologic  by size criteria. These could be reactive. Reproductive: Uterus and bilateral adnexa are unremarkable. Other: No free air. No ascites. Small fat containing umbilical hernia. Musculoskeletal: Mild degenerative changes along the spine with some curvature. IMPRESSION: Inflammatory changes involving the right kidney with  poor enhancement and multiple fluid collections consistent with abscess formation and underlying pyelonephritis. Nonobstructing right-sided renal stone. Critical Value/emergent results were called by telephone at the time of interpretation on 05/24/2022 at 12:30 pm to provider Ashok Cordia , who verbally acknowledged these results. Electronically Signed   By: Jill Side M.D.   On: 05/24/2022 15:32   CT Renal Stone Study  Result Date: 05/24/2022 CLINICAL DATA:  Flank pain. EXAM: CT ABDOMEN AND PELVIS WITHOUT CONTRAST TECHNIQUE: Multidetector CT imaging of the abdomen and pelvis was performed following the standard protocol without IV contrast. RADIATION DOSE REDUCTION: This exam was performed according to the departmental dose-optimization program which includes automated exposure control, adjustment of the mA and/or kV according to patient size and/or use of iterative reconstruction technique. COMPARISON:  CT 05/17/2022 and older FINDINGS: Lower chest: There is some breathing motion along the lung bases. Mild scar or atelectasis. No pleural effusion. These areas are improving from previous. Hepatobiliary: On this non IV contrast exam, grossly the liver is preserved. Previous cholecystectomy. Pancreas: Global pancreatic atrophy without obvious mass lesion or ductal dilatation. Spleen: Once again borderline enlarged spleen measuring 13 cm in cephalocaudal length. Adrenals/Urinary Tract: Adrenal glands are preserved. No abnormal calcification in the left kidney nor along the course of the left ureter. Once again there is enlargement of the right kidney with stranding of the perinephric space. The amount of stranding is increasing. There is a nonobstructing stone in the lower pole of the right kidney. No right ureteral stone. There are developing cystic areas identified as well at this time towards the upper aspect of the right kidney. These are somewhat ill-defined on this noncontrast exam but for example area on  series 2, image 28 measures 3.5 by 3.7 by 3.1 cm. Additional smaller area more caudal as seen on axial image 35 of series 2 measuring 2.8 by 2.4 cm. With the provided history of pyelonephritis, developing abscess or phlegmonous changes possible recommend further evaluation. A contrast study may be of some benefit if the patient is clinically able to further define these areas. Stomach/Bowel: Large bowel has a normal course and caliber with mild scattered stool. Normal appendix. The stomach and small bowel are nondilated. Vascular/Lymphatic: Normal caliber aorta and IVC. There are some small retroperitoneal nodes identified which are not pathologic by size criteria. Reproductive: Uterus and bilateral adnexa are unremarkable. Other: No ascites or free air. Musculoskeletal: Mild curvature of the spine. Minimal degenerative change. IMPRESSION: 3 mm nonobstructing lower pole right-sided renal stone again seen. Increasing perinephric stranding with increasing heterogeneous appearance of the renal parenchyma with developing ill-defined low-density areas superior laterally. No associated air. With patient's history of infection, developing abscess or phlegmonous changes possible. These areas could be defined further within IV contrast examination if clinically able. Electronically Signed   By: Jill Side M.D.   On: 05/24/2022 14:15    Assessment and Plan  Katie Hampton is a 42 y.o. female with medical history significant of pyelonephritis, HLD, diabetes mellitus, peripheral neuropathy, obesity, who presents with right flank pain, burning on urination.   Patient was diagnosed with UTI and started on Bactrim on 3/1.  She continues to have burning on urination, mild dysuria and increased urinary frequency.  She also noticed  little blood in her urine occasionally. She developed right flank pain, which is constant, aching, moderate, nonradiating.    CT abd/pelvis Inflammatory changes involving the right kidney with  poor enhancement and multiple fluid collections consistent with abscess formation and underlying pyelonephritis. Non-obstructing right-sided renal stone.  Severe sepsis due to renal abscess, right: Patient admits criteria for severe sepsis with WBC 18.8, heart rate of 112, lactic acid 3.1.   -- consulted Dr. Diamantina Providence of urology, who recommended IR consultation for possible drainage.   --Since pt is hemodynamically stable, Dr. Serafina Royals recommended continuation of IV antibiotics. If clinical course worsens or she fails to improve, call back to IR for consideration of image guide drain.  -IV cefepime -Follow-up of blood culture  negative and urine culture pending --Tolerating po diet --d/c IVF --remains afebrile --WBC 18K--15K   Diabetes mellitus (type 1 diabetes per patient, with peripheral neuropathy): No A1c available.  Blood sugar 16 white female.  Patient is taking Novolin and NPH insulin 20-22 units in AM and 18-20 units in PM -SSI -Lantus 20 units bid with SSI and adjust according to sugars   Peripheral neuropathy -Neurontin   HLD (hyperlipidemia) -Fenofibrate   New onset atrial fibrillation [facility]): EKG seems to have new onset A flutter/A-fib with variable AV block.  Heart rate 100-110.  CHADS2 score is  2(DM and female). Likely triggered by severe sepsis. I will defer the [facility] use to card. -Consulted Dr. Rockey Situ for cardiology--pt back in NSR --d/c tele --low dose BB   Obesity (BMI 30-39.9): Body weight 100.7 kg, BMI 39.33 -Healthy diet, and exercise -Encourage losing weight   Right finger/hand tightness --no pain. Cont to monitor   Family communication :family at bedside Consults :IR, Urology CODE STATUS:full  DVT Prophylaxis : Level of care: Telemetry Medical Status is: Inpatient Remains inpatient appropriate because: renal abscess    TOTAL TIME TAKING CARE OF THIS PATIENT: 35 minutes.  >50% time spent on counselling and coordination of care  Note: This dictation was  prepared with Dragon dictation along with smaller phrase technology. Any transcriptional errors that result from this process are unintentional.  Fritzi Mandes M.D    [facility]   CC: Primary care physician; Candida Peeling, PA-C

## 2022-05-26 ENCOUNTER — Inpatient Hospital Stay (HOSPITAL_COMMUNITY)
Admit: 2022-05-26 | Discharge: 2022-05-26 | Disposition: A | Discharge status: Home / Self Care | Payer: Medicare Other | Attending: Cardiology | Admitting: Cardiology

## 2022-05-26 DIAGNOSIS — N151 Renal and perinephric abscess: Secondary | ICD-10-CM | POA: Diagnosis not present

## 2022-05-26 DIAGNOSIS — E785 Hyperlipidemia, unspecified: Secondary | ICD-10-CM

## 2022-05-26 DIAGNOSIS — E669 Obesity, unspecified: Secondary | ICD-10-CM

## 2022-05-26 DIAGNOSIS — I4891 Unspecified atrial fibrillation: Secondary | ICD-10-CM | POA: Diagnosis not present

## 2022-05-26 DIAGNOSIS — E1040 Type 1 diabetes mellitus with diabetic neuropathy, unspecified: Secondary | ICD-10-CM | POA: Diagnosis not present

## 2022-05-26 LAB — GLUCOSE, CAPILLARY
Glucose-Capillary: 309 mg/dL — ABNORMAL HIGH (ref 70–99)
Glucose-Capillary: 418 mg/dL — ABNORMAL HIGH (ref 70–99)
Glucose-Capillary: 101 mg/dL — ABNORMAL HIGH (ref 70–99)
Glucose-Capillary: 39 mg/dL — CL (ref 70–99)
Glucose-Capillary: 44 mg/dL — CL (ref 70–99)
Glucose-Capillary: 49 mg/dL — ABNORMAL LOW (ref 70–99)
Glucose-Capillary: 183 mg/dL — ABNORMAL HIGH (ref 70–99)

## 2022-05-26 LAB — ECHOCARDIOGRAM COMPLETE
AR max vel: 1.43 cm2
AV Area VTI: 1.34 cm2
AV Area mean vel: 1.35 cm2
AV Mean grad: 6 mmHg
AV Peak grad: 10.3 mmHg
Ao pk vel: 1.61 m/s
Calc EF: 67 %
Height: 63 in
S' Lateral: 3.1 cm
Single Plane A2C EF: 60.8 %
Single Plane A4C EF: 69.9 %
Weight: 3552.02 oz

## 2022-05-26 LAB — URINE CULTURE: Culture: <10000 — AB

## 2022-05-26 MED ORDER — FLECAINIDE ACETATE 50 MG PO TABS
75.0000 mg | ORAL_TABLET | Freq: Two times a day (BID) | ORAL | Status: DC
  Administered 2022-05-26 – 2022-05-29 (×6): 75 mg via ORAL
  Filled 2022-05-26 (×7): qty 2

## 2022-05-26 MED ORDER — INSULIN NPH (HUMAN) (ISOPHANE) 100 UNIT/ML ~~LOC~~ SUSP: 25.0000 [IU] | Freq: Two times a day (BID) | SUBCUTANEOUS | Status: DC

## 2022-05-26 MED ORDER — METOPROLOL TARTRATE 25 MG PO TABS
25.0000 mg | ORAL_TABLET | Freq: Two times a day (BID) | ORAL | Status: DC
  Administered 2022-05-26 – 2022-05-29 (×6): 25 mg via ORAL
  Filled 2022-05-26 (×6): qty 1

## 2022-05-26 MED ORDER — FLECAINIDE ACETATE 50 MG PO TABS
75.0000 mg | ORAL_TABLET | ORAL | Status: AC
  Administered 2022-05-26: 75 mg via ORAL
  Filled 2022-05-26: qty 2

## 2022-05-26 MED ORDER — SODIUM CHLORIDE 0.9 % IV SOLN: INTRAVENOUS | Status: DC | PRN

## 2022-05-26 MED ORDER — ORAL CARE MOUTH RINSE: 15.0000 mL | OROMUCOSAL | Status: DC | PRN

## 2022-05-26 MED ORDER — METOPROLOL TARTRATE 25 MG PO TABS: 25.0000 mg | ORAL_TABLET | Freq: Two times a day (BID) | ORAL | Status: DC

## 2022-05-26 MED ORDER — PERFLUTREN LIPID MICROSPHERE
1.0000 mL | INTRAVENOUS | Status: AC | PRN
  Administered 2022-05-26: 4 mL via INTRAVENOUS

## 2022-05-26 NOTE — Progress Notes (Signed)
Patient Name: Katie Hampton      SUBJECTIVE: Less back pain today ambulating  Past Medical History:  Diagnosis Date   Diabetes mellitus    Pyelonephritis     Scheduled Meds:  Scheduled Meds:  enoxaparin (LOVENOX) injection  0.5 mg/kg Subcutaneous Q24H   fenofibrate  160 mg Oral Daily   gabapentin  600 mg Oral QHS   insulin aspart  0-15 Units Subcutaneous TID WC   insulin aspart  0-5 Units Subcutaneous QHS   insulin NPH Human  25 Units Subcutaneous BID AC   Continuous Infusions:  ceFEPime (MAXIPIME) IV 2 g (05/26/22 0506)   sodium chloride Stopped (05/24/22 1744)   acetaminophen, metoprolol tartrate, morphine injection, ondansetron (ZOFRAN) IV, mouth rinse, oxyCODONE-acetaminophen, SUMAtriptan    PHYSICAL EXAM Vitals:   05/25/22 1545 05/25/22 1905 05/26/22 0500 05/26/22 0724  BP: 127/76 134/76 120/82 128/89  Pulse: (!) 102 83 89 61  Resp: '16 16 17 12  '$ Temp: (!) 101 F (38.3 C) 98.8 F (37.1 C) 100.1 F (37.8 C) 98.2 F (36.8 C)  TempSrc:  Oral Oral   SpO2: 97% 96% 96% 96%  Weight:      Height:        Well developed and nourished in no acute distress HENT normal Neck supple with JVP-  flat, Clear Regular rate and rhythm, no murmurs or gallops Abd-soft with active BS No Clubbing cyanosis edema Skin-warm and dry A & Oriented  Grossly normal sensory and motor function     TELEMETRY: Reviewed personnally pt in not yet intiated :  ECG personally reviewed    Intake/Output Summary (Last 24 hours) at 05/26/2022 1323 Last data filed at 05/26/2022 0527 Gross per 24 hour  Intake 620.22 ml  Output 0 ml  Net 620.22 ml    LABS: Basic Metabolic Panel: Recent Labs  Lab 05/24/22 1306 05/25/22 0451  NA 132* 131*  K 3.9 4.0  CL 97* 100  CO2 23 22  GLUCOSE 234* 343*  BUN 7 6  CREATININE 0.82 0.78  CALCIUM 8.5* 8.0*   Cardiac Enzymes: No results for input(s): "CKTOTAL", "CKMB", "CKMBINDEX", "TROPONINI" in the last 72 hours. CBC: Recent  Labs  Lab 05/24/22 1306 05/25/22 0451  WBC 18.8* 15.2*  NEUTROABS 15.5*  --   HGB 11.2* 9.2*  HCT 35.3* 29.2*  MCV 95.4 93.9  PLT 590* 524*   PROTIME: Recent Labs    05/24/22 1704  LABPROT 14.4  INR 1.1   Liver Function Tests: Recent Labs    05/24/22 1306  AST 23  ALT 17  ALKPHOS 231*  BILITOT 0.5  PROT 7.7  ALBUMIN 2.6*   Thyroid Function Tests: Recent Labs    05/24/22 1306  TSH 1.001      ASSESSMENT AND PLAN:  Atrial fibrillation and multiple atrial flutters typical and atypical (but not reverse typical) rapid ventricular response   Sepsis secondary renal abscess elevated lactate   Diabetes  Hgb 9 << 11      Atrial fibrillation is recurring (not on telemetry yet but will replace) as ECG is not showing recurrent atrial arrhythmia with rapid rates.  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  This may further inform anticoagulation recommendations but only basis of her diabetes only at her young age I would not recommend anticoagulation long-term and indeed does not need to be on anticoagulation now as she does not need  cardioversion as her atrial episodes are intermittent and self terminating.  I have on Sunday reviewed this with her.  In this regard she also does not benefit from aspirin  She also has a history of sustained high heart rates in the 130s.  ECG in the past has shown a P wave that is strikingly dimorphic which could either be sinus tachycardia or an ectopic rhythm in the vicinity of the sinus node at some point this will need clarification so as to inform therapies.  For now we will add flecainide adjunctively to the metoprolol and have reviewed with her  Will need EP followup in 3-4 weeks and would like to discharge with Zio XT  Her Hgb is down  will need to follow        Signed, Virl Axe MD  05/26/2022

## 2022-05-26 NOTE — Progress Notes (Signed)
   05/26/22 1838  Assess: MEWS Score  Temp (!) 100.9 F (38.3 C)  BP 133/75  Pulse Rate (!) 120  Level of Consciousness Alert  SpO2 97 %  O2 Device Room Air  Assess: MEWS Score  MEWS Temp 1  MEWS Systolic 0  MEWS Pulse 2  MEWS RR 0  MEWS LOC 0  MEWS Score 3  MEWS Score Color Yellow  Assess: if the MEWS score is Yellow or Red  Were vital signs taken at a resting state? Yes  Focused Assessment No change from prior assessment  Does the patient meet 2 or more of the SIRS criteria? No  MEWS guidelines implemented  Yes, yellow  Treat  MEWS Interventions Considered administering scheduled or prn medications/treatments as ordered  Take Vital Signs  Increase Vital Sign Frequency  Yellow: Q2hr x1, continue Q4hrs until patient remains green for 12hrs  Escalate  MEWS: Escalate Yellow: Discuss with charge nurse and consider notifying provider and/or RRT  Notify: Charge Nurse/RN  Name of Charge Nurse/RN Notified Rochel Brome  Provider Notification  Provider Name/Title Dr Posey Pronto  Date Provider Notified 05/26/22  Time Provider Notified [id number]  Method of Notification Call  Notification Reason Change in status  Provider response See new orders  Date of Provider Response 05/26/22  Time of Provider Response 1852  Assess: SIRS CRITERIA  SIRS Temperature  0  SIRS Pulse 1  SIRS Respirations  0  SIRS WBC 0  SIRS Score Sum  1   Order received or oral metoprolol. Metoprolol and tylenol given

## 2022-05-26 NOTE — Progress Notes (Signed)
[facility] [place] NAME: Katie Hampton    MR#:  [id number]  DATE OF BIRTH:  09/26/1980  SUBJECTIVE:  Husband at bedside Sugars up No fever today Flank pain better. Patient ambulating out in the hallways.    VITALS:  Blood pressure 128/89, pulse 61, temperature 98.2 F (36.8 C), resp. rate 12, height '5\' 3"'$  (1.6 m), weight 100.7 kg, last menstrual period 05/17/2022, SpO2 96 %.  PHYSICAL EXAMINATION:   GENERAL:  42 y.o.-year-old patient with no acute distress. Obese LUNGS: Normal breath sounds bilaterally, no wheezing CARDIOVASCULAR: S1, S2 normal. No murmur   ABDOMEN: Soft, nontender, nondistended. Bowel sounds present.  EXTREMITIES: No  edema b/l.    NEUROLOGIC: nonfocal  patient is alert and awake SKIN: No obvious rash, lesion, or ulcer.   LABORATORY PANEL:  CBC Recent Labs  Lab 05/25/22 0451  WBC 15.2*  HGB 9.2*  HCT 29.2*  PLT 524*     Chemistries  Recent Labs  Lab 05/24/22 1306 05/25/22 0451  NA 132* 131*  K 3.9 4.0  CL 97* 100  CO2 23 22  GLUCOSE 234* 343*  BUN 7 6  CREATININE 0.82 0.78  CALCIUM 8.5* 8.0*  AST 23  --   ALT 17  --   ALKPHOS 231*  --   BILITOT 0.5  --     Cardiac Enzymes No results for input(s): "TROPONINI" in the last 168 hours. RADIOLOGY:  CT ABDOMEN PELVIS W CONTRAST  Result Date: 05/24/2022 CLINICAL DATA:  Renal abscess.  Sepsis.  Recent UTI. EXAM: CT ABDOMEN AND PELVIS WITH CONTRAST TECHNIQUE: Multidetector CT imaging of the abdomen and pelvis was performed using the standard protocol following bolus administration of intravenous contrast. RADIATION DOSE REDUCTION: This exam was performed according to the departmental dose-optimization program which includes automated exposure control, adjustment of the mA and/or kV according to patient size and/or use of iterative reconstruction technique. CONTRAST:  148m OMNIPAQUE IOHEXOL 300 MG/ML  SOLN COMPARISON:  Noncontrast CT 05/24/2022  earlier FINDINGS: Lower chest: Minimal basilar scar or atelectasis. No pleural effusion. There is possible enlarged node to the right of the esophagus at the edge of the imaging field on series 2, image 1 measuring 13 mm. Hepatobiliary: No space-occupying liver lesion. Patent portal vein. Previous cholecystectomy. Pancreas: Mild pancreatic atrophy without mass. Spleen: Borderline enlarged spleen at 13 cm. Adrenals/Urinary Tract: Adrenal glands are preserved. No enhancing lesion in the left kidney. No collecting system dilatation. Once again there is a 3 mm stone lower pole of the right kidney. There is enlargement of the right kidney with severe perinephric stranding and thickening. There are several areas of poor enhancement along the mid to upper aspect of the right kidney with several fluid collections. Largest is seen superiorly and lateral on series 2 image 32 measuring 3.5 by 3.0 cm in the axial plane. Cephalocaudal 3.1 cm. There are additional foci extending along the midportion of the kidney laterally which is multiloculated. On axial image 40 of series 2 3.2 by 2.1 by 3.8 cm. This may extend subcapsular as well. Additional smaller foci slightly above this in addition. Overall cephalocaudal extent a proximally 7.3 cm. These are worrisome for abscess formation. No collecting system dilatation of the right kidney. Preserved contours of the urinary bladder. Stomach/Bowel: Moderate colonic stool. Large bowel is of normal course and caliber normal appendix. Small bowel is nondilated. Stomach is nondilated. Vascular/Lymphatic: Normal caliber aorta and IVC. There are several small nodes identified in  the retroperitoneum. These are less than a cm in short axis and not pathologic by size criteria. These could be reactive. Reproductive: Uterus and bilateral adnexa are unremarkable. Other: No free air. No ascites. Small fat containing umbilical hernia. Musculoskeletal: Mild degenerative changes along the spine with  some curvature. IMPRESSION: Inflammatory changes involving the right kidney with poor enhancement and multiple fluid collections consistent with abscess formation and underlying pyelonephritis. Nonobstructing right-sided renal stone. Critical Value/emergent results were called by telephone at the time of interpretation on 05/24/2022 at 12:30 pm to provider Ashok Cordia , who verbally acknowledged these results. Electronically Signed   By: Jill Side M.D.   On: 05/24/2022 15:32   CT Renal Stone Study  Result Date: 05/24/2022 CLINICAL DATA:  Flank pain. EXAM: CT ABDOMEN AND PELVIS WITHOUT CONTRAST TECHNIQUE: Multidetector CT imaging of the abdomen and pelvis was performed following the standard protocol without IV contrast. RADIATION DOSE REDUCTION: This exam was performed according to the departmental dose-optimization program which includes automated exposure control, adjustment of the mA and/or kV according to patient size and/or use of iterative reconstruction technique. COMPARISON:  CT 05/17/2022 and older FINDINGS: Lower chest: There is some breathing motion along the lung bases. Mild scar or atelectasis. No pleural effusion. These areas are improving from previous. Hepatobiliary: On this non IV contrast exam, grossly the liver is preserved. Previous cholecystectomy. Pancreas: Global pancreatic atrophy without obvious mass lesion or ductal dilatation. Spleen: Once again borderline enlarged spleen measuring 13 cm in cephalocaudal length. Adrenals/Urinary Tract: Adrenal glands are preserved. No abnormal calcification in the left kidney nor along the course of the left ureter. Once again there is enlargement of the right kidney with stranding of the perinephric space. The amount of stranding is increasing. There is a nonobstructing stone in the lower pole of the right kidney. No right ureteral stone. There are developing cystic areas identified as well at this time towards the upper aspect of the right kidney.  These are somewhat ill-defined on this noncontrast exam but for example area on series 2, image 28 measures 3.5 by 3.7 by 3.1 cm. Additional smaller area more caudal as seen on axial image 35 of series 2 measuring 2.8 by 2.4 cm. With the provided history of pyelonephritis, developing abscess or phlegmonous changes possible recommend further evaluation. A contrast study may be of some benefit if the patient is clinically able to further define these areas. Stomach/Bowel: Large bowel has a normal course and caliber with mild scattered stool. Normal appendix. The stomach and small bowel are nondilated. Vascular/Lymphatic: Normal caliber aorta and IVC. There are some small retroperitoneal nodes identified which are not pathologic by size criteria. Reproductive: Uterus and bilateral adnexa are unremarkable. Other: No ascites or free air. Musculoskeletal: Mild curvature of the spine. Minimal degenerative change. IMPRESSION: 3 mm nonobstructing lower pole right-sided renal stone again seen. Increasing perinephric stranding with increasing heterogeneous appearance of the renal parenchyma with developing ill-defined low-density areas superior laterally. No associated air. With patient's history of infection, developing abscess or phlegmonous changes possible. These areas could be defined further within IV contrast examination if clinically able. Electronically Signed   By: Jill Side M.D.   On: 05/24/2022 14:15    Assessment and Plan  Chairty Rumberger is a 42 y.o. female with medical history significant of pyelonephritis, HLD, diabetes mellitus, peripheral neuropathy, obesity, who presents with right flank pain, burning on urination.   Patient was diagnosed with UTI and started on Bactrim on 3/1.  She continues to  have burning on urination, mild dysuria and increased urinary frequency.  She also noticed little blood in her urine occasionally. She developed right flank pain, which is constant, aching, moderate,  nonradiating.    CT abd/pelvis Inflammatory changes involving the right kidney with poor enhancement and multiple fluid collections consistent with abscess formation and underlying pyelonephritis. Non-obstructing right-sided renal stone.  Severe sepsis due to renal abscess, right: Patient admits criteria for severe sepsis with WBC 18.8, heart rate of 112, lactic acid 3.1.   -- consulted Dr. Diamantina Providence of urology, who recommended IR consultation for possible drainage.   --Since pt is hemodynamically stable, Dr. Serafina Royals recommended continuation of IV antibiotics. If clinical course worsens or she fails to improve, call back to IR for consideration of image guide drain.  -IV cefepime -Follow-up of blood culture  negative and urine culture <10K insignificant growth --Tolerating po diet --remains afebrile --WBC 18K--15K   Diabetes mellitus (type 1 diabetes per patient, with peripheral neuropathy): No A1c available.  Blood sugar 46 white female.  Patient is taking Novolin and NPH insulin 20-22 units in AM and 18-20 units in PM -SSI -Lantus 25 units bid with SSI and adjust according to sugars --A1c pending   Peripheral neuropathy -Neurontin   HLD (hyperlipidemia) -Fenofibrate   New onset atrial fibrillation [facility]): EKG seems to have new onset A flutter/A-fib with variable AV block.  Heart rate 100-110.  CHADS2 score is  2(DM and female). Likely triggered by severe sepsis. -Consulted Dr. Rockey Situ for cardiology--pt back in [facility] --d/c tele--pt back in SR --prn BB   Obesity (BMI 30-39.9): Body weight 100.7 kg, BMI 39.33 -Healthy diet, and exercise -Encourage losing weight   Right finger/hand tightness --no pain. Cont to monitor   Family communication :family at bedside Consults :IR, Urology CODE STATUS:full  DVT Prophylaxis : Level of care: Telemetry Medical Status is: Inpatient Remains inpatient appropriate because: renal abscess    TOTAL TIME TAKING CARE OF THIS PATIENT: 35 minutes.   >50% time spent on counselling and coordination of care  Note: This dictation was prepared with Dragon dictation along with smaller phrase technology. Any transcriptional errors that result from this process are unintentional.  Fritzi Mandes M.D    [facility]   CC: Primary care physician; Candida Peeling, PA-C

## 2022-05-26 NOTE — Progress Notes (Signed)
Hypoglycemic Event  CBG: 39  Treatment: 2 juices  Symptoms: Sweaty and Shaky  Follow-up CBG: Time: 1710 cbg was 49, at 1530 cbg was 101 CBG Result:101  Possible Reasons for Event: Unknown  Comments/MD notified:Dr Posey Pronto updated    Katie Hampton

## 2022-05-26 NOTE — Progress Notes (Signed)
CBG was 418. Order received to give the patient a total of 40 units of novolog

## 2022-05-27 ENCOUNTER — Inpatient Hospital Stay: Payer: Medicare Other

## 2022-05-27 DIAGNOSIS — N151 Renal and perinephric abscess: Secondary | ICD-10-CM | POA: Diagnosis not present

## 2022-05-27 DIAGNOSIS — E1040 Type 1 diabetes mellitus with diabetic neuropathy, unspecified: Secondary | ICD-10-CM | POA: Diagnosis not present

## 2022-05-27 DIAGNOSIS — Z87440 Personal history of urinary (tract) infections: Secondary | ICD-10-CM

## 2022-05-27 DIAGNOSIS — E785 Hyperlipidemia, unspecified: Secondary | ICD-10-CM | POA: Diagnosis not present

## 2022-05-27 DIAGNOSIS — E669 Obesity, unspecified: Secondary | ICD-10-CM | POA: Diagnosis not present

## 2022-05-27 LAB — GLUCOSE, CAPILLARY
Glucose-Capillary: 407 mg/dL — ABNORMAL HIGH (ref 70–99)
Glucose-Capillary: 268 mg/dL — ABNORMAL HIGH (ref 70–99)
Glucose-Capillary: 322 mg/dL — ABNORMAL HIGH (ref 70–99)
Glucose-Capillary: 336 mg/dL — ABNORMAL HIGH (ref 70–99)

## 2022-05-27 LAB — HEMOGLOBIN A1C
Hgb A1c MFr Bld: 7.4 % — ABNORMAL HIGH (ref 4.8–5.6)
Mean Plasma Glucose: 166 mg/dL

## 2022-05-27 MED ORDER — INSULIN GLARGINE-YFGN 100 UNIT/ML ~~LOC~~ SOLN
16.0000 [IU] | Freq: Two times a day (BID) | SUBCUTANEOUS | Status: DC
  Administered 2022-05-27: 16 [IU] via SUBCUTANEOUS
  Filled 2022-05-27 (×3): qty 0.16

## 2022-05-27 NOTE — Progress Notes (Signed)
Rounding Note    Patient Name: Katie Hampton Date of Encounter: 05/27/2022  [place] Cardiologist: None   Subjective   Patient seen on rounds.  Denies any chest pain, shortness of breath, palpitations.  Does endorse some dizziness that is just recently started.  She is potentially scheduled to go to interventional radiology for a drain to her renal abscess this afternoon.  Inpatient Medications    Scheduled Meds:  enoxaparin (LOVENOX) injection  0.5 mg/kg Subcutaneous Q24H   fenofibrate  160 mg Oral Daily   flecainide  75 mg Oral Q12H   gabapentin  600 mg Oral QHS   insulin aspart  0-15 Units Subcutaneous TID WC   insulin aspart  0-5 Units Subcutaneous QHS   insulin glargine-yfgn  16 Units Subcutaneous BID   metoprolol tartrate  25 mg Oral BID   Continuous Infusions:  sodium chloride Stopped (05/27/22 0630)   ceFEPime (MAXIPIME) IV 200 mL/hr at 05/27/22 0631   sodium chloride Stopped (05/24/22 1744)   PRN Meds: sodium chloride, acetaminophen, metoprolol tartrate, morphine injection, ondansetron (ZOFRAN) IV, mouth rinse, oxyCODONE-acetaminophen, SUMAtriptan   Vital Signs    Vitals:   05/26/22 2030 05/26/22 2357 05/27/22 0417 05/27/22 0838  BP: 129/66 109/65 110/65 (!) 120/98  Pulse: 83 73 100 77  Resp: '20 20 16 18  '$ Temp: 98.8 F (37.1 C) 99.1 F (37.3 C) 99.3 F (37.4 C) 99 F (37.2 C)  TempSrc: Oral   Oral  SpO2: 97% 96% 96% 96%  Weight:      Height:        Intake/Output Summary (Last 24 hours) at 05/27/2022 1136 Last data filed at 05/27/2022 0631 Gross per 24 hour  Intake 807.95 ml  Output 0 ml  Net 807.95 ml      05/24/2022   12:55 PM 05/17/2022    5:33 PM 03/09/2021    7:59 AM  Last 3 Weights  Weight (lbs) 222 lb 222 lb 215 lb  Weight (kg) 100.699 kg 100.699 kg 97.523 kg      Telemetry    Atrial fibrillation rates 90-120 - Personally Reviewed  ECG    No new tracings - Personally Reviewed  Physical Exam   GEN: No acute  distress.   Neck: No JVD Cardiac: Irregularly irregular, no murmurs, rubs, or gallops.  Respiratory: Clear to auscultation bilaterally.  Respirations are unlabored at rest on room air GI: Soft, nontender, non-distended  MS: No edema; No deformity. Neuro:  Nonfocal  Psych: Normal affect   Labs    High Sensitivity Troponin:   Recent Labs  Lab 05/24/22 1924 05/24/22 2128  TROPONINIHS 5 5     Chemistry Recent Labs  Lab 05/24/22 1306 05/25/22 0451  NA 132* 131*  K 3.9 4.0  CL 97* 100  CO2 23 22  GLUCOSE 234* 343*  BUN 7 6  CREATININE 0.82 0.78  CALCIUM 8.5* 8.0*  PROT 7.7  --   ALBUMIN 2.6*  --   AST 23  --   ALT 17  --   ALKPHOS 231*  --   BILITOT 0.5  --   GFRNONAA >60 >60  ANIONGAP 12 9    Lipids No results for input(s): "CHOL", "TRIG", "HDL", "LABVLDL", "LDLCALC", "CHOLHDL" in the last 168 hours.  Hematology Recent Labs  Lab 05/24/22 1306 05/25/22 0451  WBC 18.8* 15.2*  RBC 3.70* 3.11*  HGB 11.2* 9.2*  HCT 35.3* 29.2*  MCV 95.4 93.9  MCH 30.3 29.6  MCHC 31.7 31.5  RDW 13.2  13.2  PLT 590* 524*   Thyroid  Recent Labs  Lab 05/24/22 1306  TSH 1.001    BNPNo results for input(s): "BNP", "PROBNP" in the last 168 hours.  DDimer No results for input(s): "DDIMER" in the last 168 hours.   Radiology      Cardiac Studies  TTE 05/26/22 1. Left ventricular ejection fraction, by estimation, is 60 to 65%. The  left ventricle has normal function. The left ventricle has no regional  wall motion abnormalities. Left ventricular diastolic parameters are  indeterminate.   2. Right ventricular systolic function is normal. The right ventricular  size is normal. There is normal pulmonary artery systolic pressure. The  estimated right ventricular systolic pressure is [id number] mmHg.   3. The mitral valve is normal in structure. No evidence of mitral valve  regurgitation. No evidence of mitral stenosis.   4. The aortic valve has an indeterminant number of cusps. Aortic  valve  regurgitation is not visualized. No aortic stenosis is present.   5. The inferior vena cava is normal in size with greater than 50%  respiratory variability, suggesting right atrial pressure of 3 mmHg.   6. There is a very small patent foramen ovale with minimal left to right  shunting across the atrial septum.   Patient Profile     42 y.o. female with a past medical history of diabetes, pyelonephritis, recurrent UTIs, and kidney stones who is being seen and evaluated for new onset atrial fibrillation and atrial flutter.  Assessment & Plan    New onset atrial fibrillation with multiple atrial flutters typical and atypical -Continue to be in atrial fibrillation this afternoon on telemetry monitoring -Continue with current telemetry monitoring -Not currently on anticoagulation she does not need cardioversion as her atrial episodes are intermittent and self terminating -She has a longstanding history of sustained high heart rates in the 130s looking back at previous EKGs completed in the hospital -She is continued on flecainide and metoprolol -She will need to EP follow-up in 3 to 4 weeks postdischarge -She also needs to be discharged on a ZIO XT monitor -Patient is relatively asymptomatic  Sepsis secondary to renal abscess with elevated lactate -Lactic acid 3.1 -Renal abscess noted on scans -Continues to be followed by urology -Possible placement of drainage tube to be placed in interventional radiology this afternoon  Diabetes -Continued on insulin -Management per IM  4.   Hyperlipidemia -Continued on fenofibrate     For questions or updates, please contact [place] Please consult [email or web address] for contact info under        Signed, Demian Maisel, NP  05/27/2022, 11:36 AM

## 2022-05-27 NOTE — Progress Notes (Signed)
[facility] NAME: Katie Hampton    MR#:  [id number]  DATE OF BIRTH:  May 02, 1980  SUBJECTIVE:  No family at bedside Sugars up  fever yesterday. Tmax 100.9 with tachycardia  Patient ambulating out in the hallways.    VITALS:  Blood pressure (!) 120/98, pulse 77, temperature 99 F (37.2 C), temperature source Oral, resp. rate 18, height '5\' 3"'$  (1.6 m), weight 100.7 kg, last menstrual period 05/17/2022, SpO2 96 %.  PHYSICAL EXAMINATION:   GENERAL:  42 y.o.-year-old patient with no acute distress. Obese LUNGS: Normal breath sounds bilaterally, no wheezing CARDIOVASCULAR: S1, S2 normal. No murmur  intermittent tachycardia ABDOMEN: Soft, nontender, nondistended. Bowel sounds present.  EXTREMITIES: No  edema b/l.    NEUROLOGIC: nonfocal  patient is alert and awake SKIN: No obvious rash, lesion, or ulcer.   LABORATORY PANEL:  CBC Recent Labs  Lab 05/25/22 0451  WBC 15.2*  HGB 9.2*  HCT 29.2*  PLT 524*     Chemistries  Recent Labs  Lab 05/24/22 1306 05/25/22 0451  NA 132* 131*  K 3.9 4.0  CL 97* 100  CO2 23 22  GLUCOSE 234* 343*  BUN 7 6  CREATININE 0.82 0.78  CALCIUM 8.5* 8.0*  AST 23  --   ALT 17  --   ALKPHOS 231*  --   BILITOT 0.5  --      RADIOLOGY:  ECHOCARDIOGRAM COMPLETE  Result Date: 05/26/2022    ECHOCARDIOGRAM REPORT   Patient Name:   Katie Hampton Date of Exam: 05/26/2022 Medical Rec #:  [id number]        Height:       63.0 in Accession #:    [id number]       Weight:       222.0 lb Date of Birth:  [birth year]       BSA:          2.021 m Patient Age:    42 years         BP:           120/82 mmHg Patient Gender: F                HR:           88 bpm. Exam Location:  [facility] Procedure: 2D Echo, Color Doppler, Cardiac Doppler and Intracardiac            Opacification Agent Indications:     Atrial Fibrillation I48.91  History:         Patient has no prior history of Echocardiogram examinations.                   Risk Factors:Diabetes and Morbid Obesity.  Sonographer:     L. Thornton-Maynard Referring Phys:  [id number] SHERI HAMMOCK Diagnosing Phys: Ida Rogue MD  Sonographer Comments: Suboptimal apical window and patient is obese. IMPRESSIONS  1. Left ventricular ejection fraction, by estimation, is 60 to 65%. The left ventricle has normal function. The left ventricle has no regional wall motion abnormalities. Left ventricular diastolic parameters are indeterminate.  2. Right ventricular systolic function is normal. The right ventricular size is normal. There is normal pulmonary artery systolic pressure. The estimated right ventricular systolic pressure is [id number] mmHg.  3. The mitral valve is normal in structure. No evidence of mitral valve regurgitation. No evidence of mitral stenosis.  4. The aortic valve has an indeterminant number of cusps. Aortic valve regurgitation is not  visualized. No aortic stenosis is present.  5. The inferior vena cava is normal in size with greater than 50% respiratory variability, suggesting right atrial pressure of 3 mmHg.  6. There is a very small patent foramen ovale with minimal left to right shunting across the atrial septum. FINDINGS  Left Ventricle: Left ventricular ejection fraction, by estimation, is 60 to 65%. The left ventricle has normal function. The left ventricle has no regional wall motion abnormalities. Definity contrast agent was given IV to delineate the left ventricular  endocardial borders. The left ventricular internal cavity size was normal in size. There is no left ventricular hypertrophy. Left ventricular diastolic parameters are indeterminate. Right Ventricle: The right ventricular size is normal. No increase in right ventricular wall thickness. Right ventricular systolic function is normal. There is normal pulmonary artery systolic pressure. The tricuspid regurgitant velocity is 2.40 m/s, and  with an assumed right atrial pressure of 3 mmHg, the estimated right  ventricular systolic pressure is [id number] mmHg. Left Atrium: Left atrial size was normal in size. Right Atrium: Right atrial size was normal in size. Pericardium: There is no evidence of pericardial effusion. Mitral Valve: The mitral valve is normal in structure. No evidence of mitral valve regurgitation. No evidence of mitral valve stenosis. Tricuspid Valve: The tricuspid valve is normal in structure. Tricuspid valve regurgitation is mild . No evidence of tricuspid stenosis. Aortic Valve: The aortic valve has an indeterminant number of cusps. Aortic valve regurgitation is not visualized. No aortic stenosis is present. Aortic valve mean gradient measures 6.0 mmHg. Aortic valve peak gradient measures 10.3 mmHg. Aortic valve area, by VTI measures 1.34 cm. Pulmonic Valve: The pulmonic valve was normal in structure. Pulmonic valve regurgitation is not visualized. No evidence of pulmonic stenosis. Aorta: The aortic root is normal in size and structure. Venous: The inferior vena cava is normal in size with greater than 50% respiratory variability, suggesting right atrial pressure of 3 mmHg. IAS/Shunts: No atrial level shunt detected by color flow Doppler. A small patent foramen ovale is detected with predominantly left to right shunting across the atrial septum.  LEFT VENTRICLE PLAX 2D LVIDd:         4.30 cm     Diastology LVIDs:         3.10 cm     LV e' medial:    11.10 cm/s LV PW:         1.10 cm     LV E/e' medial:  9.5 LV IVS:        0.90 cm     LV e' lateral:   11.70 cm/s LVOT diam:     1.80 cm     LV E/e' lateral: 9.1 LV SV:         36 LV SV Index:   18 LVOT Area:     2.54 cm  LV Volumes (MOD) LV vol d, MOD A2C: 51.8 ml LV vol d, MOD A4C: 81.4 ml LV vol s, MOD A2C: 20.3 ml LV vol s, MOD A4C: 24.5 ml LV SV MOD A2C:     31.5 ml LV SV MOD A4C:     81.4 ml LV SV MOD BP:      45.7 ml LEFT ATRIUM             Index        RIGHT ATRIUM           Index LA diam:        4.20 cm 2.08 cm/m  RA Area:     20.50 cm LA Vol (A2C):    34.6 ml 17.12 ml/m  RA Volume:   62.70 ml  31.02 ml/m LA Vol (A4C):   33.6 ml 16.62 ml/m LA Biplane Vol: 34.9 ml 17.27 ml/m  AORTIC VALVE                     PULMONIC VALVE AV Area (Vmax):    1.43 cm      PV Vmax:       1.14 m/s AV Area (Vmean):   1.35 cm      PV Peak grad:  5.2 mmHg AV Area (VTI):     1.34 cm AV Vmax:           160.50 cm/s AV Vmean:          111.500 cm/s AV VTI:            0.267 m AV Peak Grad:      10.3 mmHg AV Mean Grad:      6.0 mmHg LVOT Vmax:         90.10 cm/s LVOT Vmean:        59.200 cm/s LVOT VTI:          0.141 m LVOT/AV VTI ratio: 0.53  AORTA Ao Root diam: 2.80 cm Ao Asc diam:  2.60 cm MV E velocity: 106.00 cm/s  TRICUSPID VALVE MV A velocity: 87.00 cm/s   TR Peak grad:   23.0 mmHg MV E/A ratio:  1.22         TR Vmax:        240.00 cm/s                              SHUNTS                             Systemic VTI:  0.14 m                             Systemic Diam: 1.80 cm Ida Rogue MD Electronically signed by Ida Rogue MD Signature Date/Time: 05/26/2022/2:20:58 PM    Final     Assessment and Plan  Katie Hampton is a 42 y.o. female with medical history significant of pyelonephritis, HLD, diabetes mellitus, peripheral neuropathy, obesity, who presents with right flank pain, burning on urination.   Patient was diagnosed with UTI and started on Bactrim on 3/1.  She continues to have burning on urination, mild dysuria and increased urinary frequency.  She also noticed little blood in her urine occasionally. She developed right flank pain, which is constant, aching, moderate, nonradiating.    CT abd/pelvis Inflammatory changes involving the right kidney with poor enhancement and multiple fluid collections consistent with abscess formation and underlying pyelonephritis. Non-obstructing right-sided renal stone.  Severe sepsis due to renal abscess, right: Patient admits criteria for severe sepsis with WBC 18.8, heart rate of 112, lactic acid 3.1.   -- consulted  Dr. Diamantina Providence of urology, who recommended IR consultation for possible drainage.   --Since pt is hemodynamically stable, Dr. Serafina Royals recommended continuation of IV antibiotics. If clinical course worsens or she fails to improve, call back to IR for consideration of image guide drain.  -IV cefepime -Follow-up of blood culture  negative and urine culture <10K insignificant growth --Tolerating po diet --remains afebrile --WBC 18K--15K --  3/11--cont with fever +tachycardia. IR to do CT guided renal abscess drainage today   Diabetes mellitus (type 1 diabetes per patient, with peripheral neuropathy)uncontrolled:  -.  Patient is taking Novolin and NPH insulin 20-22 units in AM and 18-20 units in PM -SSI --d/w DM coodinator--will do Semglee 16 units bid with SSI and adjust according to sugars --A1c7.4%   Peripheral neuropathy -Neurontin   HLD (hyperlipidemia) -Fenofibrate   New onset atrial fibrillation ([place]): EKG seems to have new onset A flutter/A-fib with variable AV block.  Heart rate 100-110.  CHADS2 score is  2(DM and female). Likely triggered by severe sepsis. -Consulted Dr. Rockey Situ for cardiology--pt back in NSR -pt goes in afib intermittently. Started on Flecainde by Dr Caryl Comes. No AC at present. Will need Zio monitor at d/c --cont BB   Obesity (BMI 30-39.9): Body weight 100.7 kg, BMI 39.33 -Healthy diet, and exercise -Encourage losing weight   Right finger/hand tightness --no pain. Cont to monitor   Family communication :none today  Consults :IR, Urology, cardiology CODE STATUS:full  DVT Prophylaxis : Level of care: Telemetry Medical Status is: Inpatient Remains inpatient appropriate because: renal abscess    TOTAL TIME TAKING CARE OF THIS PATIENT: 35 minutes.  >50% time spent on counselling and coordination of care  Note: This dictation was prepared with Dragon dictation along with smaller phrase technology. Any transcriptional errors that result from this process are  unintentional.  Fritzi Mandes M.D    [facility]   CC: Primary care physician; Candida Peeling, PA-C

## 2022-05-27 NOTE — Consult Note (Signed)
Urology Consult  I have been asked to see the patient by Dr. Posey Pronto, for evaluation and management of right renal abscess.  Chief Complaint: Right flank pain, nausea, hyperglycemia  History of Present Illness: Katie Hampton is a 42 y.o. year old female with PMH DM1 and recurrent UTI admitted since 05/24/2022 with right renal abscess.  Admission imaging notable for a large multiloculated right renal abscess measuring up to 7.3 cm as well as a nonobstructing 70m right renal stone. IR previously deferred percutaneous drainage in the setting of IV antibiotics.  She has been on empiric antibiotics as below. Admission urine culture has finalized with insignificant growth.  Despite antibiotic therapy, she has continued to spike fevers, Tmax 38.3 C last night, and has had labile blood sugars.  Today she reports ongoing right flank pain and nausea.  She reports a history of recurrent UTI and pyelonephritis dating back to 2022.  [place]  Anti-infectives (From admission, onward)    Start     Dose/Rate Route Frequency Ordered Stop   05/24/22 2130  ceFEPIme (MAXIPIME) 2 g in sodium chloride 0.9 % 100 mL IVPB        2 g 200 mL/hr over 30 Minutes Intravenous Every 8 hours 05/24/22 1658     05/24/22 1330  ceFEPIme (MAXIPIME) 2 g in sodium chloride 0.9 % 100 mL IVPB        2 g 200 mL/hr over 30 Minutes Intravenous  Once 05/24/22 1323 05/24/22 1548        Past Medical History:  Diagnosis Date   Diabetes mellitus    Pyelonephritis     Past Surgical History:  Procedure Laterality Date   CHOLECYSTECTOMY     EYE SURGERY Bilateral    TUBAL LIGATION Bilateral     Home Medications:  Current Meds  Medication Sig   Aspirin-Caffeine (BAYER BACK & BODY PO) Take 1 tablet by mouth daily as needed (pain).   Azelastine HCl 137 MCG/SPRAY SOLN Place 2 sprays into both nostrils 2 (two) times daily.   fenofibrate micronized (LOFIBRA) 200 MG capsule Take 200 mg  by mouth daily.   gabapentin (NEURONTIN) 600 MG tablet Take 600 mg by mouth at bedtime.   HYDROcodone-acetaminophen (NORCO) 5-325 MG tablet Take 1 tablet by mouth every 4 (four) hours as needed for moderate pain.   ibuprofen (ADVIL) 800 MG tablet Take 800 mg by mouth 3 (three) times daily as needed.   insulin NPH (HUMULIN N,NOVOLIN N) 100 UNIT/ML injection Inject 18-22 Units into the skin 2 (two) times daily before a meal. Per patients sliding scale: breakfast - 20-22 units,  supper 18-20 units   insulin regular (NOVOLIN R,HUMULIN R) 100 units/mL injection Inject 18-24 Units into the skin 2 (two) times daily before a meal. Per patients sliding scale: breakfast 20-24 units, supper 18-20   LUTEIN PO Take 1 capsule by mouth 2 (two) times daily.   Multiple Vitamin (MULTIVITAMIN WITH MINERALS) TABS Take 1 tablet by mouth daily.   Multiple Vitamins-Minerals (AIRBORNE PO) Take 1 tablet by mouth daily.   naproxen sodium (ALEVE) 220 MG tablet Take 880 mg by mouth 2 (two) times daily as needed (pain).   ondansetron (ZOFRAN-ODT) 4 MG disintegrating tablet Take 1 tablet (4 mg total) by mouth every 8 (eight) hours as needed for nausea or vomiting.   ondansetron (ZOFRAN-ODT) 8 MG disintegrating tablet Take 8 mg by mouth every 8 (eight) hours as needed.   sulfamethoxazole-trimethoprim (BACTRIM DS) 800-160 MG  tablet Take 1 tablet by mouth 2 (two) times daily for 10 days.   SUMAtriptan (IMITREX) 100 MG tablet Take 100 mg by mouth every 2 (two) hours as needed.   Vitamin D, Ergocalciferol, (DRISDOL) 1.25 MG (50000 UNIT) CAPS capsule Take 50,000 Units by mouth once a week.    Allergies: No Known Allergies  Family History  Problem Relation Age of Onset   Kidney disease Sister    Colon cancer Maternal Aunt     Social History:  reports that she has quit smoking. Her smoking use included cigarettes. She has never used smokeless tobacco. She reports that she does not drink alcohol and does not use drugs.  ROS: A  complete review of systems was performed.  All systems are negative except for pertinent findings as noted.  Physical Exam:  Vital signs in last 24 hours: Temp:  [98.5 F (36.9 C)-100.9 F (38.3 C)] 99.3 F (37.4 C) (03/11 0417) Pulse Rate:  [73-130] 100 (03/11 0417) Resp:  [16-20] 16 (03/11 0417) BP: (109-150)/(65-77) 110/65 (03/11 0417) SpO2:  [96 %-98 %] 96 % (03/11 0417) Constitutional:  Alert and oriented, no acute distress HEENT: [place] AT, moist mucus membranes Cardiovascular: No clubbing, cyanosis, or edema Respiratory: Normal respiratory effort Skin: No rashes, bruises or suspicious lesions Neurologic: Grossly intact, no focal deficits, moving all 4 extremities Psychiatric: Normal mood and affect  Laboratory Data:  Recent Labs    05/24/22 1306 05/25/22 0451  WBC 18.8* 15.2*  HGB 11.2* 9.2*  HCT 35.3* 29.2*   Recent Labs    05/24/22 1306 05/25/22 0451  NA 132* 131*  K 3.9 4.0  CL 97* 100  CO2 23 22  GLUCOSE 234* 343*  BUN 7 6  CREATININE 0.82 0.78  CALCIUM 8.5* 8.0*   Recent Labs    05/24/22 1704  INR 1.1   Urinalysis    Component Value Date/Time   COLORURINE STRAW (A) 05/24/2022 1306   APPEARANCEUR CLEAR (A) 05/24/2022 1306   LABSPEC 1.005 05/24/2022 1306   PHURINE 7.0 05/24/2022 1306   GLUCOSEU NEGATIVE 05/24/2022 1306   HGBUR NEGATIVE 05/24/2022 1306   BILIRUBINUR NEGATIVE 05/24/2022 1306   KETONESUR NEGATIVE 05/24/2022 1306   PROTEINUR NEGATIVE 05/24/2022 1306   UROBILINOGEN 0.2 07/30/2015 1316   NITRITE NEGATIVE 05/24/2022 1306   LEUKOCYTESUR NEGATIVE 05/24/2022 1306   Results for orders placed or performed during the hospital encounter of 05/24/22  Urine Culture     Status: Abnormal   Collection Time: 05/24/22  1:06 PM   Specimen: Urine, Clean Catch  Result Value Ref Range Status   Specimen Description   Final    URINE, CLEAN CATCH Performed at [facility], [address] [place], [place] [address]    Special Requests    Final    NONE Performed at [facility], [address] [place], [place] [address]    Culture (A)  Final    <10,000 COLONIES/mL INSIGNIFICANT GROWTH Performed at [facility], [place] [address] [place],  [address]    Report Status 05/26/2022 FINAL  Final  Blood culture (routine x 2)     Status: None (Preliminary result)   Collection Time: 05/24/22  1:31 PM   Specimen: BLOOD RIGHT ARM  Result Value Ref Range Status   Specimen Description BLOOD RIGHT ARM  Final   Special Requests   Final    BOTTLES DRAWN AEROBIC AND ANAEROBIC Blood Culture results may not be optimal due to an excessive volume of blood received  in culture bottles   Culture   Final    NO GROWTH 3 DAYS Performed at [facility], [address] [place], [place] [address]    Report Status PENDING  Incomplete  Blood culture (routine x 2)     Status: None (Preliminary result)   Collection Time: 05/24/22  1:31 PM   Specimen: BLOOD RIGHT FOREARM  Result Value Ref Range Status   Specimen Description BLOOD RIGHT FOREARM  Final   Special Requests   Final    BOTTLES DRAWN AEROBIC AND ANAEROBIC Blood Culture adequate volume   Culture   Final    NO GROWTH 3 DAYS Performed at [facility], [address] [place], [place] [address]    Report Status PENDING  Incomplete  Resp panel by RT-PCR (RSV, Flu A&B, Covid) Anterior Nasal Swab     Status: None   Collection Time: 05/24/22  1:31 PM   Specimen: Anterior Nasal Swab  Result Value Ref Range Status   SARS Coronavirus 2 by RT PCR NEGATIVE NEGATIVE Final    Comment: (NOTE) SARS-CoV-2 target nucleic acids are NOT DETECTED.  The SARS-CoV-2 RNA is generally detectable in upper respiratory specimens during the acute phase of infection. The lowest concentration of SARS-CoV-2 viral copies this assay can detect is 138 copies/mL. A negative result does not preclude SARS-Cov-2 infection and should not be used as the sole basis for treatment  or other patient management decisions. A negative result may occur with  improper specimen collection/handling, submission of specimen other than nasopharyngeal swab, presence of viral mutation(s) within the areas targeted by this assay, and inadequate number of viral copies(<138 copies/mL). A negative result must be combined with clinical observations, patient history, and epidemiological information. The expected result is Negative.  Fact Sheet for Patients:  EntrepreneurPulse.com.au  Fact Sheet for Healthcare Providers:  IncredibleEmployment.be  This test is no t yet approved or cleared by the [place] FDA and  has been authorized for detection and/or diagnosis of SARS-CoV-2 by FDA under an Emergency Use Authorization (EUA). This EUA will remain  in effect (meaning this test can be used) for the duration of the COVID-19 declaration under Section 564(b)(1) of the Act, 21 U.S.C.section 360bbb-3(b)(1), unless the authorization is terminated  or revoked sooner.       Influenza A by PCR NEGATIVE NEGATIVE Final   Influenza B by PCR NEGATIVE NEGATIVE Final    Comment: (NOTE) The Xpert Xpress SARS-CoV-2/FLU/RSV plus assay is intended as an aid in the diagnosis of influenza from Nasopharyngeal swab specimens and should not be used as a sole basis for treatment. Nasal washings and aspirates are unacceptable for Xpert Xpress SARS-CoV-2/FLU/RSV testing.  Fact Sheet for Patients: EntrepreneurPulse.com.au  Fact Sheet for Healthcare Providers: IncredibleEmployment.be  This test is not yet approved or cleared by the [place] FDA and has been authorized for detection and/or diagnosis of SARS-CoV-2 by FDA under an Emergency Use Authorization (EUA). This EUA will remain in effect (meaning this test can be used) for the duration of the COVID-19 declaration under Section 564(b)(1) of the Act, 21 U.S.C. section  360bbb-3(b)(1), unless the authorization is terminated or revoked.     Resp Syncytial Virus by PCR NEGATIVE NEGATIVE Final    Comment: (NOTE) Fact Sheet for Patients: EntrepreneurPulse.com.au  Fact Sheet for Healthcare Providers: IncredibleEmployment.be  This test is not yet approved or cleared by the [place] FDA and has been authorized for detection and/or diagnosis of SARS-CoV-2 by FDA under an Emergency Use Authorization (  EUA). This EUA will remain in effect (meaning this test can be used) for the duration of the COVID-19 declaration under Section 564(b)(1) of the Act, 21 U.S.C. section 360bbb-3(b)(1), unless the authorization is terminated or revoked.  Performed at [facility], [address] [place], [place] [address]     Radiologic Imaging: ECHOCARDIOGRAM COMPLETE  Result Date: 05/26/2022    ECHOCARDIOGRAM REPORT   Patient Name:   Winifred Tischer Date of Exam: 05/26/2022 Medical Rec #:  [id number]        Height:       63.0 in Accession #:    [id number]       Weight:       222.0 lb Date of Birth:  08/13/1980       BSA:          2.021 m Patient Age:    29 years         BP:           120/82 mmHg Patient Gender: F                HR:           88 bpm. Exam Location:  [facility] Procedure: 2D Echo, Color Doppler, Cardiac Doppler and Intracardiac            Opacification Agent Indications:     Atrial Fibrillation I48.91  History:         Patient has no prior history of Echocardiogram examinations.                  Risk Factors:Diabetes and Morbid Obesity.  Sonographer:     L. Thornton-Maynard Referring Phys:  [id number] SHERI HAMMOCK Diagnosing Phys: Ida Rogue MD  Sonographer Comments: Suboptimal apical window and patient is obese. IMPRESSIONS  1. Left ventricular ejection fraction, by estimation, is 60 to 65%. The left ventricle has normal function. The left ventricle has no regional wall motion abnormalities. Left ventricular diastolic  parameters are indeterminate.  2. Right ventricular systolic function is normal. The right ventricular size is normal. There is normal pulmonary artery systolic pressure. The estimated right ventricular systolic pressure is [id number] mmHg.  3. The mitral valve is normal in structure. No evidence of mitral valve regurgitation. No evidence of mitral stenosis.  4. The aortic valve has an indeterminant number of cusps. Aortic valve regurgitation is not visualized. No aortic stenosis is present.  5. The inferior vena cava is normal in size with greater than 50% respiratory variability, suggesting right atrial pressure of 3 mmHg.  6. There is a very small patent foramen ovale with minimal left to right shunting across the atrial septum. FINDINGS  Left Ventricle: Left ventricular ejection fraction, by estimation, is 60 to 65%. The left ventricle has normal function. The left ventricle has no regional wall motion abnormalities. Definity contrast agent was given IV to delineate the left ventricular  endocardial borders. The left ventricular internal cavity size was normal in size. There is no left ventricular hypertrophy. Left ventricular diastolic parameters are indeterminate. Right Ventricle: The right ventricular size is normal. No increase in right ventricular wall thickness. Right ventricular systolic function is normal. There is normal pulmonary artery systolic pressure. The tricuspid regurgitant velocity is 2.40 m/s, and  with an assumed right atrial pressure of 3 mmHg, the estimated right ventricular systolic pressure is [id number] mmHg. Left Atrium: Left atrial size was normal in size. Right Atrium: Right atrial size was normal in size. Pericardium: There is no evidence of  pericardial effusion. Mitral Valve: The mitral valve is normal in structure. No evidence of mitral valve regurgitation. No evidence of mitral valve stenosis. Tricuspid Valve: The tricuspid valve is normal in structure. Tricuspid valve regurgitation is mild .  No evidence of tricuspid stenosis. Aortic Valve: The aortic valve has an indeterminant number of cusps. Aortic valve regurgitation is not visualized. No aortic stenosis is present. Aortic valve mean gradient measures 6.0 mmHg. Aortic valve peak gradient measures 10.3 mmHg. Aortic valve area, by VTI measures 1.34 cm. Pulmonic Valve: The pulmonic valve was normal in structure. Pulmonic valve regurgitation is not visualized. No evidence of pulmonic stenosis. Aorta: The aortic root is normal in size and structure. Venous: The inferior vena cava is normal in size with greater than 50% respiratory variability, suggesting right atrial pressure of 3 mmHg. IAS/Shunts: No atrial level shunt detected by color flow Doppler. A small patent foramen ovale is detected with predominantly left to right shunting across the atrial septum.  LEFT VENTRICLE PLAX 2D LVIDd:         4.30 cm     Diastology LVIDs:         3.10 cm     LV e' medial:    11.10 cm/s LV PW:         1.10 cm     LV E/e' medial:  9.5 LV IVS:        0.90 cm     LV e' lateral:   11.70 cm/s LVOT diam:     1.80 cm     LV E/e' lateral: 9.1 LV SV:         36 LV SV Index:   18 LVOT Area:     2.54 cm  LV Volumes (MOD) LV vol d, MOD A2C: 51.8 ml LV vol d, MOD A4C: 81.4 ml LV vol s, MOD A2C: 20.3 ml LV vol s, MOD A4C: 24.5 ml LV SV MOD A2C:     31.5 ml LV SV MOD A4C:     81.4 ml LV SV MOD BP:      45.7 ml LEFT ATRIUM             Index        RIGHT ATRIUM           Index LA diam:        4.20 cm 2.08 cm/m   RA Area:     20.50 cm LA Vol (A2C):   34.6 ml 17.12 ml/m  RA Volume:   62.70 ml  31.02 ml/m LA Vol (A4C):   33.6 ml 16.62 ml/m LA Biplane Vol: 34.9 ml 17.27 ml/m  AORTIC VALVE                     PULMONIC VALVE AV Area (Vmax):    1.43 cm      PV Vmax:       1.14 m/s AV Area (Vmean):   1.35 cm      PV Peak grad:  5.2 mmHg AV Area (VTI):     1.34 cm AV Vmax:           160.50 cm/s AV Vmean:          111.500 cm/s AV VTI:            0.267 m AV Peak Grad:      10.3 mmHg AV  Mean Grad:      6.0 mmHg LVOT Vmax:         90.10  cm/s LVOT Vmean:        59.200 cm/s LVOT VTI:          0.141 m LVOT/AV VTI ratio: 0.53  AORTA Ao Root diam: 2.80 cm Ao Asc diam:  2.60 cm MV E velocity: 106.00 cm/s  TRICUSPID VALVE MV A velocity: 87.00 cm/s   TR Peak grad:   23.0 mmHg MV E/A ratio:  1.22         TR Vmax:        240.00 cm/s                              SHUNTS                             Systemic VTI:  0.14 m                             Systemic Diam: 1.80 cm Ida Rogue MD Electronically signed by Ida Rogue MD Signature Date/Time: 05/26/2022/2:20:58 PM    Final     Assessment & Plan:  42 year old female with PMH DM1 and recurrent UTIs admitted with a large multiloculated right renal abscess.  She has continued to have labile blood sugars and fevers despite IV antibiotic therapy.  At this point, she is failing IV antibiotics alone.  We recommend reconsideration of percutaneous abscess drainage with IR.  Will defer to them on consideration of repeat imaging prior.  Patient is in agreement with this plan.  Recommendations: -Keep n.p.o. pending repeat consultation with IR for reconsideration of percutaneous drainage of right renal abscess -Continue empiric antibiotics x2 weeks total, recommend discharging on antibiotic with excellent renal tissue penetration, e.g. Bactrim, Cipro, Levaquin -Will schedule her for outpatient follow-up in our clinic to discuss management of her recurrent UTIs  Thank you for involving me in this patient's care, I will continue to follow along.  Debroah Loop, PA-C 05/27/2022 8:36 AM

## 2022-05-27 NOTE — TOC Initial Note (Signed)
Transition of Care [facility]) - Initial/Assessment Note    Patient Details  Name: Katie Hampton MRN: [id number] Date of Birth: [birth year]  Transition of Care [facility]) CM/SW Contact:    Beverly Sessions, RN Phone Number: 05/27/2022, 2:50 PM  Clinical Narrative:                  Transition of Care [facility]) Screening Note   Patient Details  Name: Katie Hampton Date of Birth: 03-Jan-1981   Transition of Care [facility]) CM/SW Contact:    Beverly Sessions, RN Phone Number: 05/27/2022, 2:50 PM    Transition of Care Department [facility]) has reviewed patient and no TOC needs have been identified at this time. We will continue to monitor patient advancement through interdisciplinary progression rounds. If new patient transition needs arise, please place a TOC consult.          Patient Goals and CMS Choice            Expected Discharge Plan and Services                                              Prior Living Arrangements/Services                       Activities of Daily Living Home Assistive Devices/Equipment: None ADL Screening (condition at time of admission) Patient's cognitive ability adequate to safely complete daily activities?: Yes Is the patient deaf or have difficulty hearing?: No Does the patient have difficulty seeing, even when wearing glasses/contacts?: No Does the patient have difficulty concentrating, remembering, or making decisions?: No Patient able to express need for assistance with ADLs?: No Does the patient have difficulty dressing or bathing?: No Independently performs ADLs?: Yes (appropriate for developmental age) Does the patient have difficulty walking or climbing stairs?: No Weakness of Legs: None Weakness of Arms/Hands: None  Permission Sought/Granted                  Emotional Assessment              Admission diagnosis:  Abscess of right kidney [N15.1] Renal abscess, right [N15.1] Acute pyelonephritis  [N10] Acute sepsis [facility]) [A41.9] Patient Active Problem List   Diagnosis Date Noted   Renal abscess, right 05/24/2022   Severe sepsis ([place]) 05/24/2022   Diabetes mellitus ([place]) 05/24/2022   Peripheral neuropathy 05/24/2022   New onset atrial fibrillation ([place]) 05/24/2022   HLD (hyperlipidemia) 05/24/2022   Obesity (BMI 30-39.9) 05/24/2022   PCP:  Candida Peeling, PA-C Pharmacy:   [facility] ([facility]), [place] [id number] [place] [address] Phone: [phone] Fax: [phone]  [id number] [place] [id number]: [phone]: [phone]    Social Determinants of Health (SDOH) Social History: SDOH Screenings   Food Insecurity: No Food Insecurity (05/24/2022)  Housing: Low Risk  (05/24/2022)  Transportation Needs: No Transportation Needs (05/24/2022)  Utilities: Not At Risk (05/24/2022)  Tobacco Use: Medium Risk (05/24/2022)   SDOH Interventions:     Readmission Risk Interventions     No data to display

## 2022-05-27 NOTE — Consult Note (Signed)
Chief Complaint: Patient was seen in consultation today for right renal abscess   Referring Physician(s): Ivor Costa, MD  Supervising Physician: Michaelle Birks  Patient Status: [place] - In-pt  History of Present Illness: Katie Hampton is a 42 y.o. female with PMH significant for type 1 diabetes mellitus and pyelonephritis being seen today in relation to a right renal abscess. Patient presented to [facility] ED on 3/1 with a reported active UTI being treated with Cipro. She was found to have pyelonephritis at that time. She was discharged and presented again to [facility] ED on 3/8 and was admitted at that time. Further imaging revealed the presence of complex fluid collections concerning for abscess in the right kidney. IR evaluated the patient on 3/8 and at that time it was found that intervention was not warranted. However, the patient had persistent fever, tachycardia, and right flank pain through the weekend. IR was contacted to evaluate the patient for possible right renal abscess drain on 3/11.  Past Medical History:  Diagnosis Date   Diabetes mellitus    Pyelonephritis     Past Surgical History:  Procedure Laterality Date   CHOLECYSTECTOMY     EYE SURGERY Bilateral    TUBAL LIGATION Bilateral     Allergies: Patient has no known allergies.  Medications: Prior to Admission medications   Medication Sig Start Date End Date Taking? Authorizing Provider  Aspirin-Caffeine (BAYER BACK & BODY PO) Take 1 tablet by mouth daily as needed (pain).   Yes [provider]  Azelastine HCl 137 MCG/SPRAY SOLN Place 2 sprays into both nostrils 2 (two) times daily. 05/13/22  Yes [provider]  fenofibrate micronized (LOFIBRA) 200 MG capsule Take 200 mg by mouth daily. 05/03/22  Yes [provider]  gabapentin (NEURONTIN) 600 MG tablet Take 600 mg by mouth at bedtime. 01/30/21  Yes [provider]  HYDROcodone-acetaminophen (NORCO) 5-325 MG tablet Take 1 tablet by  mouth every 4 (four) hours as needed for moderate pain. 05/17/22  Yes Duanne Guess, PA-C  ibuprofen (ADVIL) 800 MG tablet Take 800 mg by mouth 3 (three) times daily as needed. 05/03/22  Yes [provider]  insulin NPH (HUMULIN N,NOVOLIN N) 100 UNIT/ML injection Inject 18-22 Units into the skin 2 (two) times daily before a meal. Per patients sliding scale: breakfast - 20-22 units,  supper 18-20 units   Yes [provider]  insulin regular (NOVOLIN R,HUMULIN R) 100 units/mL injection Inject 18-24 Units into the skin 2 (two) times daily before a meal. Per patients sliding scale: breakfast 20-24 units, supper 18-20   Yes [provider]  LUTEIN PO Take 1 capsule by mouth 2 (two) times daily.   Yes [provider]  Multiple Vitamin (MULTIVITAMIN WITH MINERALS) TABS Take 1 tablet by mouth daily.   Yes [provider]  Multiple Vitamins-Minerals (AIRBORNE PO) Take 1 tablet by mouth daily.   Yes [provider]  naproxen sodium (ALEVE) 220 MG tablet Take 880 mg by mouth 2 (two) times daily as needed (pain).   Yes [provider]  ondansetron (ZOFRAN-ODT) 4 MG disintegrating tablet Take 1 tablet (4 mg total) by mouth every 8 (eight) hours as needed for nausea or vomiting. 05/17/22  Yes Duanne Guess, PA-C  ondansetron (ZOFRAN-ODT) 8 MG disintegrating tablet Take 8 mg by mouth every 8 (eight) hours as needed. 05/13/22  Yes [provider]  sulfamethoxazole-trimethoprim (BACTRIM DS) 800-160 MG tablet Take 1 tablet by mouth 2 (two) times daily for 10  days. 05/17/22 05/27/22 Yes Duanne Guess, PA-C  SUMAtriptan (IMITREX) 100 MG tablet Take 100 mg by mouth every 2 (two) hours as needed. 05/03/22  Yes [provider]  Vitamin D, Ergocalciferol, (DRISDOL) 1.25 MG (50000 UNIT) CAPS capsule Take 50,000 Units by mouth once a week. 05/03/22  Yes [provider]  cefdinir (OMNICEF) 300 MG capsule Take 1 capsule (300 mg total) by mouth  2 (two) times daily. Patient not taking: Reported on 05/24/2022 03/09/21   Letitia Neri L, PA-C  potassium chloride SA (KLOR-CON M) 20 MEQ tablet Take 1 tablet (20 mEq total) by mouth daily. Patient not taking: Reported on 05/24/2022 03/09/21   Johnn Hai, PA-C     Family History  Problem Relation Age of Onset   Kidney disease Sister    Colon cancer Maternal Aunt     Social History   Socioeconomic History   Marital status: Married    Spouse name: Not on file   Number of children: Not on file   Years of education: Not on file   Highest education level: Not on file  Occupational History   Not on file  Tobacco Use   Smoking status: Former    Types: Cigarettes   Smokeless tobacco: Never  Vaping Use   Vaping Use: Never used  Substance and Sexual Activity   Alcohol use: No   Drug use: Never   Sexual activity: Not on file  Other Topics Concern   Not on file  Social History Narrative   Not on file   Social Determinants of Health   Financial Resource Strain: Not on file  Food Insecurity: No Food Insecurity (05/24/2022)   Hunger Vital Sign    Worried About Running Out of Food in the Last Year: Never true    Ran Out of Food in the Last Year: Never true  Transportation Needs: No Transportation Needs (05/24/2022)   PRAPARE - Hydrologist (Medical): No    Lack of Transportation (Non-Medical): No  Physical Activity: Not on file  Stress: Not on file  Social Connections: Not on file    Code Status: Full code  Review of Systems: A 12 point ROS discussed and pertinent positives are indicated in the HPI above.  All other systems are negative.  Review of Systems  Constitutional:  Positive for chills and fever.  Respiratory:  Negative for chest tightness and shortness of breath.   Cardiovascular:  Negative for chest pain and leg swelling.  Gastrointestinal:  Positive for abdominal pain, nausea and vomiting. Negative for diarrhea.  Genitourinary:   Positive for flank pain.  Neurological:  Negative for dizziness and headaches.  Psychiatric/Behavioral:  Negative for confusion.     Vital Signs: BP 124/74 (BP Location: Right Arm)   Pulse 68   Temp 98.3 F (36.8 C) (Oral)   Resp 17   Ht '5\' 3"'$  (1.6 m)   Wt 222 lb (100.7 kg)   LMP 05/17/2022 (Within Days)   SpO2 97%   BMI 39.33 kg/m     Physical Exam Vitals reviewed.  Cardiovascular:     Rate and Rhythm: Normal rate and regular rhythm.     Pulses: Normal pulses.     Heart sounds: Normal heart sounds.  Pulmonary:     Effort: Pulmonary effort is normal.     Breath sounds: Normal breath sounds.  Abdominal:     General: Bowel sounds are normal.     Palpations: Abdomen is soft.  Tenderness: There is right CVA tenderness.  Musculoskeletal:     Right lower leg: No edema.     Left lower leg: No edema.  Skin:    General: Skin is warm and dry.  Neurological:     Mental Status: She is alert and oriented to person, place, and time.  Psychiatric:        Mood and Affect: Mood normal.        Behavior: Behavior normal.        Thought Content: Thought content normal.        Judgment: Judgment normal.     Imaging: ECHOCARDIOGRAM COMPLETE  Result Date: 05/26/2022    ECHOCARDIOGRAM REPORT   Patient Name:   Katie Hampton Date of Exam: 05/26/2022 Medical Rec #:  [id number]        Height:       63.0 in Accession #:    [id number]       Weight:       222.0 lb Date of Birth:  10-22-1980       BSA:          2.021 m Patient Age:    83 years         BP:           120/82 mmHg Patient Gender: F                HR:           88 bpm. Exam Location:  [facility] Procedure: 2D Echo, Color Doppler, Cardiac Doppler and Intracardiac            Opacification Agent Indications:     Atrial Fibrillation I48.91  History:         Patient has no prior history of Echocardiogram examinations.                  Risk Factors:Diabetes and Morbid Obesity.  Sonographer:     L. Thornton-Maynard Referring Phys:  [id number]  SHERI HAMMOCK Diagnosing Phys: Ida Rogue MD  Sonographer Comments: Suboptimal apical window and patient is obese. IMPRESSIONS  1. Left ventricular ejection fraction, by estimation, is 60 to 65%. The left ventricle has normal function. The left ventricle has no regional wall motion abnormalities. Left ventricular diastolic parameters are indeterminate.  2. Right ventricular systolic function is normal. The right ventricular size is normal. There is normal pulmonary artery systolic pressure. The estimated right ventricular systolic pressure is [id number] mmHg.  3. The mitral valve is normal in structure. No evidence of mitral valve regurgitation. No evidence of mitral stenosis.  4. The aortic valve has an indeterminant number of cusps. Aortic valve regurgitation is not visualized. No aortic stenosis is present.  5. The inferior vena cava is normal in size with greater than 50% respiratory variability, suggesting right atrial pressure of 3 mmHg.  6. There is a very small patent foramen ovale with minimal left to right shunting across the atrial septum. FINDINGS  Left Ventricle: Left ventricular ejection fraction, by estimation, is 60 to 65%. The left ventricle has normal function. The left ventricle has no regional wall motion abnormalities. Definity contrast agent was given IV to delineate the left ventricular  endocardial borders. The left ventricular internal cavity size was normal in size. There is no left ventricular hypertrophy. Left ventricular diastolic parameters are indeterminate. Right Ventricle: The right ventricular size is normal. No increase in right ventricular wall thickness. Right ventricular systolic function is normal. There is normal pulmonary artery systolic pressure. The  tricuspid regurgitant velocity is 2.40 m/s, and  with an assumed right atrial pressure of 3 mmHg, the estimated right ventricular systolic pressure is [id number] mmHg. Left Atrium: Left atrial size was normal in size. Right Atrium:  Right atrial size was normal in size. Pericardium: There is no evidence of pericardial effusion. Mitral Valve: The mitral valve is normal in structure. No evidence of mitral valve regurgitation. No evidence of mitral valve stenosis. Tricuspid Valve: The tricuspid valve is normal in structure. Tricuspid valve regurgitation is mild . No evidence of tricuspid stenosis. Aortic Valve: The aortic valve has an indeterminant number of cusps. Aortic valve regurgitation is not visualized. No aortic stenosis is present. Aortic valve mean gradient measures 6.0 mmHg. Aortic valve peak gradient measures 10.3 mmHg. Aortic valve area, by VTI measures 1.34 cm. Pulmonic Valve: The pulmonic valve was normal in structure. Pulmonic valve regurgitation is not visualized. No evidence of pulmonic stenosis. Aorta: The aortic root is normal in size and structure. Venous: The inferior vena cava is normal in size with greater than 50% respiratory variability, suggesting right atrial pressure of 3 mmHg. IAS/Shunts: No atrial level shunt detected by color flow Doppler. A small patent foramen ovale is detected with predominantly left to right shunting across the atrial septum.  LEFT VENTRICLE PLAX 2D LVIDd:         4.30 cm     Diastology LVIDs:         3.10 cm     LV e' medial:    11.10 cm/s LV PW:         1.10 cm     LV E/e' medial:  9.5 LV IVS:        0.90 cm     LV e' lateral:   11.70 cm/s LVOT diam:     1.80 cm     LV E/e' lateral: 9.1 LV SV:         36 LV SV Index:   18 LVOT Area:     2.54 cm  LV Volumes (MOD) LV vol d, MOD A2C: 51.8 ml LV vol d, MOD A4C: 81.4 ml LV vol s, MOD A2C: 20.3 ml LV vol s, MOD A4C: 24.5 ml LV SV MOD A2C:     31.5 ml LV SV MOD A4C:     81.4 ml LV SV MOD BP:      45.7 ml LEFT ATRIUM             Index        RIGHT ATRIUM           Index LA diam:        4.20 cm 2.08 cm/m   RA Area:     20.50 cm LA Vol (A2C):   34.6 ml 17.12 ml/m  RA Volume:   62.70 ml  31.02 ml/m LA Vol (A4C):   33.6 ml 16.62 ml/m LA Biplane  Vol: 34.9 ml 17.27 ml/m  AORTIC VALVE                     PULMONIC VALVE AV Area (Vmax):    1.43 cm      PV Vmax:       1.14 m/s AV Area (Vmean):   1.35 cm      PV Peak grad:  5.2 mmHg AV Area (VTI):     1.34 cm AV Vmax:           160.50 cm/s AV Vmean:  111.500 cm/s AV VTI:            0.267 m AV Peak Grad:      10.3 mmHg AV Mean Grad:      6.0 mmHg LVOT Vmax:         90.10 cm/s LVOT Vmean:        59.200 cm/s LVOT VTI:          0.141 m LVOT/AV VTI ratio: 0.53  AORTA Ao Root diam: 2.80 cm Ao Asc diam:  2.60 cm MV E velocity: 106.00 cm/s  TRICUSPID VALVE MV A velocity: 87.00 cm/s   TR Peak grad:   23.0 mmHg MV E/A ratio:  1.22         TR Vmax:        240.00 cm/s                              SHUNTS                             Systemic VTI:  0.14 m                             Systemic Diam: 1.80 cm Ida Rogue MD Electronically signed by Ida Rogue MD Signature Date/Time: 05/26/2022/2:20:58 PM    Final    CT ABDOMEN PELVIS W CONTRAST  Result Date: 05/24/2022 CLINICAL DATA:  Renal abscess.  Sepsis.  Recent UTI. EXAM: CT ABDOMEN AND PELVIS WITH CONTRAST TECHNIQUE: Multidetector CT imaging of the abdomen and pelvis was performed using the standard protocol following bolus administration of intravenous contrast. RADIATION DOSE REDUCTION: This exam was performed according to the departmental dose-optimization program which includes automated exposure control, adjustment of the mA and/or kV according to patient size and/or use of iterative reconstruction technique. CONTRAST:  175m OMNIPAQUE IOHEXOL 300 MG/ML  SOLN COMPARISON:  Noncontrast CT 05/24/2022 earlier FINDINGS: Lower chest: Minimal basilar scar or atelectasis. No pleural effusion. There is possible enlarged node to the right of the esophagus at the edge of the imaging field on series 2, image 1 measuring 13 mm. Hepatobiliary: No space-occupying liver lesion. Patent portal vein. Previous cholecystectomy. Pancreas: Mild pancreatic atrophy without  mass. Spleen: Borderline enlarged spleen at 13 cm. Adrenals/Urinary Tract: Adrenal glands are preserved. No enhancing lesion in the left kidney. No collecting system dilatation. Once again there is a 3 mm stone lower pole of the right kidney. There is enlargement of the right kidney with severe perinephric stranding and thickening. There are several areas of poor enhancement along the mid to upper aspect of the right kidney with several fluid collections. Largest is seen superiorly and lateral on series 2 image 32 measuring 3.5 by 3.0 cm in the axial plane. Cephalocaudal 3.1 cm. There are additional foci extending along the midportion of the kidney laterally which is multiloculated. On axial image 40 of series 2 3.2 by 2.1 by 3.8 cm. This may extend subcapsular as well. Additional smaller foci slightly above this in addition. Overall cephalocaudal extent a proximally 7.3 cm. These are worrisome for abscess formation. No collecting system dilatation of the right kidney. Preserved contours of the urinary bladder. Stomach/Bowel: Moderate colonic stool. Large bowel is of normal course and caliber normal appendix. Small bowel is nondilated. Stomach is nondilated. Vascular/Lymphatic: Normal caliber aorta and IVC. There are several small nodes identified in the  retroperitoneum. These are less than a cm in short axis and not pathologic by size criteria. These could be reactive. Reproductive: Uterus and bilateral adnexa are unremarkable. Other: No free air. No ascites. Small fat containing umbilical hernia. Musculoskeletal: Mild degenerative changes along the spine with some curvature. IMPRESSION: Inflammatory changes involving the right kidney with poor enhancement and multiple fluid collections consistent with abscess formation and underlying pyelonephritis. Nonobstructing right-sided renal stone. Critical Value/emergent results were called by telephone at the time of interpretation on 05/24/2022 at 12:30 pm to provider  Ashok Cordia , who verbally acknowledged these results. Electronically Signed   By: Jill Side M.D.   On: 05/24/2022 15:32   CT Renal Stone Study  Result Date: 05/24/2022 CLINICAL DATA:  Flank pain. EXAM: CT ABDOMEN AND PELVIS WITHOUT CONTRAST TECHNIQUE: Multidetector CT imaging of the abdomen and pelvis was performed following the standard protocol without IV contrast. RADIATION DOSE REDUCTION: This exam was performed according to the departmental dose-optimization program which includes automated exposure control, adjustment of the mA and/or kV according to patient size and/or use of iterative reconstruction technique. COMPARISON:  CT 05/17/2022 and older FINDINGS: Lower chest: There is some breathing motion along the lung bases. Mild scar or atelectasis. No pleural effusion. These areas are improving from previous. Hepatobiliary: On this non IV contrast exam, grossly the liver is preserved. Previous cholecystectomy. Pancreas: Global pancreatic atrophy without obvious mass lesion or ductal dilatation. Spleen: Once again borderline enlarged spleen measuring 13 cm in cephalocaudal length. Adrenals/Urinary Tract: Adrenal glands are preserved. No abnormal calcification in the left kidney nor along the course of the left ureter. Once again there is enlargement of the right kidney with stranding of the perinephric space. The amount of stranding is increasing. There is a nonobstructing stone in the lower pole of the right kidney. No right ureteral stone. There are developing cystic areas identified as well at this time towards the upper aspect of the right kidney. These are somewhat ill-defined on this noncontrast exam but for example area on series 2, image 28 measures 3.5 by 3.7 by 3.1 cm. Additional smaller area more caudal as seen on axial image 35 of series 2 measuring 2.8 by 2.4 cm. With the provided history of pyelonephritis, developing abscess or phlegmonous changes possible recommend further evaluation. A  contrast study may be of some benefit if the patient is clinically able to further define these areas. Stomach/Bowel: Large bowel has a normal course and caliber with mild scattered stool. Normal appendix. The stomach and small bowel are nondilated. Vascular/Lymphatic: Normal caliber aorta and IVC. There are some small retroperitoneal nodes identified which are not pathologic by size criteria. Reproductive: Uterus and bilateral adnexa are unremarkable. Other: No ascites or free air. Musculoskeletal: Mild curvature of the spine. Minimal degenerative change. IMPRESSION: 3 mm nonobstructing lower pole right-sided renal stone again seen. Increasing perinephric stranding with increasing heterogeneous appearance of the renal parenchyma with developing ill-defined low-density areas superior laterally. No associated air. With patient's history of infection, developing abscess or phlegmonous changes possible. These areas could be defined further within IV contrast examination if clinically able. Electronically Signed   By: Jill Side M.D.   On: 05/24/2022 14:15   CT Renal Stone Study  Result Date: 05/17/2022 CLINICAL DATA:  Flank pain.  Recent diagnosis of pyelonephritis EXAM: CT ABDOMEN AND PELVIS WITHOUT CONTRAST TECHNIQUE: Multidetector CT imaging of the abdomen and pelvis was performed following the standard protocol without IV contrast. RADIATION DOSE REDUCTION: This exam was performed according  to the departmental dose-optimization program which includes automated exposure control, adjustment of the mA and/or kV according to patient size and/or use of iterative reconstruction technique. COMPARISON:  Renal stone CT 08/23/2020 and older FINDINGS: Lower chest: Bandlike opacities along the bases. Favor atelectasis. Trace right-sided pleural fluid. Hepatobiliary: On this non IV contrast exam the liver is grossly preserved. Previous cholecystectomy. Pancreas: Global mild pancreatic atrophy without obvious mass or ductal  dilatation. Appearance is similar to prior. Spleen: Spleen measures 12.9 cm in cephalocaudal length, borderline enlarged. Adrenals/Urinary Tract: Adrenal glands are preserved. No abnormal calcifications seen within the left kidney nor along the course of the left ureter. No left-sided renal collecting system dilatation. Preserved contours of the urinary bladder There is enlargement of the right kidney with stranding and slightly nodular contour thickening along the lateral aspect with heterogeneity of the parenchyma. This could go along with patient's provided history of pyelonephritis. There is a nonobstructing 3 mm lower pole right-sided renal stone without collecting system dilatation. Stomach/Bowel: On this non oral contrast exam, the large bowel has a normal course and caliber with scattered stool. Normal appendix. Mild debris in the stomach. Small bowel is nondilated. Vascular/Lymphatic: Normal caliber aorta and IVC with atherosclerotic change. No specific abnormal lymph node enlargement identified in the abdomen and pelvis. Reproductive: Uterus and bilateral adnexa are unremarkable. Other: Mild anasarca. No ascites. Small fat containing umbilical hernia Musculoskeletal: No acute or significant osseous findings. IMPRESSION: 3 mm nonobstructing lower pole right-sided renal stone. No ureteral stones. There is significant perinephric stranding on the right with enlargement of the right kidney and heterogeneous appearance of the renal parenchyma. The changes could go along with the patient's provided history of pyelonephritis. If there is concern of the sequela of pyelonephritis including abscess or phlegmonous formation, an IV contrast study may be of some benefit for higher sensitivity. Borderline enlarged spleen Electronically Signed   By: Jill Side M.D.   On: 05/17/2022 19:46    Labs:  CBC: Recent Labs    03/29/22 1725 05/17/22 1747 05/24/22 1306 05/25/22 0451  WBC 13.2* 10.7* 18.8* 15.2*  HGB  13.7 11.7* 11.2* 9.2*  HCT 41.5 35.4* 35.3* 29.2*  PLT 285 312 590* 524*    COAGS: Recent Labs    05/24/22 1704  INR 1.1  APTT 30    BMP: Recent Labs    03/29/22 1725 05/17/22 1747 05/24/22 1306 05/25/22 0451  NA 132* 132* 132* 131*  K 3.7 3.9 3.9 4.0  CL 101 101 97* 100  CO2 17* 20* 23 22  GLUCOSE 429* 403* 234* 343*  BUN '11 12 7 6  '$ CALCIUM 8.3* 8.2* 8.5* 8.0*  CREATININE 1.11* 0.93 0.82 0.78  GFRNONAA >60 >60 >60 >60    LIVER FUNCTION TESTS: Recent Labs    05/24/22 1306  BILITOT 0.5  AST 23  ALT 17  ALKPHOS 231*  PROT 7.7  ALBUMIN 2.6*    TUMOR MARKERS: No results for input(s): "AFPTM", "CEA", "CA199", "CHROMGRNA" in the last 8760 hours.  Assessment and Plan:  Reham Blackwater is a 42 yo female being seen today in relation to suspected right renal abscess. Patient has failed conservative management with antibiotics since initial evaluation by IR on 3/8. Per a discussion with hospitalist team, it was felt that image-guided right renal drain is now indicated. Case reviewed and approved by Dr Maryelizabeth Kaufmann and is tentatively scheduled to proceed on 05/28/22. The patient has been made NPO starting at midnight tonight. Patient's Lovenox has been held.  Risks  and benefits discussed with the patient including bleeding, infection, damage to adjacent structures, bowel perforation/fistula connection, and sepsis.  All of the patient's questions were answered, patient is agreeable to proceed. Consent signed and in CT suite.    Thank you for this interesting consult.  I greatly enjoyed meeting Katie Hampton and look forward to participating in their care.  A copy of this report was sent to the requesting provider on this date.  Electronically Signed: Lura Em, PA-C 05/27/2022, 5:02 PM   I spent a total of 40 Minutes    in face to face in clinical consultation, greater than 50% of which was counseling/coordinating care for right renal abscess.

## 2022-05-27 NOTE — Care Management Important Message (Signed)
Important Message  Patient Details  Name: Katie Hampton MRN: [id number] Date of Birth: [birth year]   Medicare Important Message Given:  Yes     Dannette Barbara 05/27/2022, 10:41 AM

## 2022-05-27 NOTE — Inpatient Diabetes Management (Signed)
Inpatient Diabetes Program Recommendations  AACE/ADA: New Consensus Statement on Inpatient Glycemic Control (2015)  Target Ranges:  Prepandial:   less than 140 mg/dL      Peak postprandial:   less than 180 mg/dL (1-2 hours)      Critically ill patients:  140 - 180 mg/dL   Lab Results  Component Value Date   GLUCAP 407 (H) 05/27/2022   HGBA1C 7.4 (H) 05/25/2022    Latest Reference Range & Units 05/26/22 07:46 05/26/22 11:56 05/26/22 16:38 05/26/22 16:41 05/26/22 17:03 05/26/22 17:29 05/26/22 21:04 05/27/22 07:40  Glucose-Capillary 70 - 99 mg/dL 309 (H) Novolog 11 units NPH 20 units 418 (H) Novolog 40 units 44 (LL) 39 (LL) 49 (L) 101 (H) 183 (H) 407 (H) Novolog 15 units  (LL): Data is critically low (H): Data is abnormally high (L): Data is abnormally low  Diabetes history: DM1 (requires basal, meal coverage & correction) Outpatient Diabetes medications: Novolin N 20-22 units ac breakfast, 18-20 units ac supper, Novolin R 20-24 units ac breakfast, 18-20 units ac supper Current orders for Inpatient glycemic control: NPH 25 units bid ac meals, Novolog 0-15 units tid, 0-5 units hs  Inpatient Diabetes Program Recommendations:   Spoke with patient regarding diabetes management and insulin dosing. Patient has had type 1 diabetes since 82 months old. She has tried other insulin but current regimen controls her CBGs best according to patient. Patient requires basal insulin and missed doses last pm and this am and CBGs very labile with doses of Novolog. Has received total of Novolog 55 units over the past 24 hrs. And only basal of NPH 20 units yesterday.   Please consider: -Semglee 16 units bid (80 % home basal dose) -Decrease Novolog correction to 0-9 units q 4 hrs. While NPO, then tid, + hs 0-5 units -D/C NPH insulin -Add Novolog 4 units tid meal coverage if eats 50% meal when eating again  Thank you, Bethena Roys E. Mahasin Riviere, RN, MSN, CDE  Diabetes Coordinator Inpatient Glycemic Control  Team Team Pager [phone] (8am-5pm) 05/27/2022 11:11 AM

## 2022-05-28 ENCOUNTER — Inpatient Hospital Stay: Payer: Medicare Other

## 2022-05-28 DIAGNOSIS — N151 Renal and perinephric abscess: Secondary | ICD-10-CM | POA: Diagnosis not present

## 2022-05-28 DIAGNOSIS — Z794 Long term (current) use of insulin: Secondary | ICD-10-CM

## 2022-05-28 DIAGNOSIS — I480 Paroxysmal atrial fibrillation: Secondary | ICD-10-CM | POA: Diagnosis not present

## 2022-05-28 DIAGNOSIS — I4892 Unspecified atrial flutter: Secondary | ICD-10-CM

## 2022-05-28 DIAGNOSIS — E1040 Type 1 diabetes mellitus with diabetic neuropathy, unspecified: Secondary | ICD-10-CM | POA: Diagnosis not present

## 2022-05-28 LAB — CBC WITH DIFFERENTIAL/PLATELET
Abs Immature Granulocytes: 0.07 10*3/uL (ref 0.00–0.07)
Basophils Absolute: 0.1 10*3/uL (ref 0.0–0.1)
Basophils Relative: 1 %
Eosinophils Absolute: 0.1 10*3/uL (ref 0.0–0.5)
Eosinophils Relative: 1 %
HCT: 31.4 % — ABNORMAL LOW (ref 36.0–46.0)
Hemoglobin: 10.4 g/dL — ABNORMAL LOW (ref 12.0–15.0)
Immature Granulocytes: 1 %
Lymphocytes Relative: 7 %
Lymphs Abs: 1.1 10*3/uL (ref 0.7–4.0)
MCH: 30.6 pg (ref 26.0–34.0)
MCHC: 33.1 g/dL (ref 30.0–36.0)
MCV: 92.4 fL (ref 80.0–100.0)
Monocytes Absolute: 0.7 10*3/uL (ref 0.1–1.0)
Monocytes Relative: 5 %
Neutro Abs: 13.4 10*3/uL — ABNORMAL HIGH (ref 1.7–7.7)
Neutrophils Relative %: 85 %
Platelets: 692 10*3/uL — ABNORMAL HIGH (ref 150–400)
RBC: 3.4 MIL/uL — ABNORMAL LOW (ref 3.87–5.11)
RDW: 13.2 % (ref 11.5–15.5)
WBC: 15.4 10*3/uL — ABNORMAL HIGH (ref 4.0–10.5)
nRBC: 0 % (ref 0.0–0.2)

## 2022-05-28 LAB — GLUCOSE, CAPILLARY
Glucose-Capillary: 427 mg/dL — ABNORMAL HIGH (ref 70–99)
Glucose-Capillary: 434 mg/dL — ABNORMAL HIGH (ref 70–99)
Glucose-Capillary: 385 mg/dL — ABNORMAL HIGH (ref 70–99)
Glucose-Capillary: 273 mg/dL — ABNORMAL HIGH (ref 70–99)
Glucose-Capillary: 246 mg/dL — ABNORMAL HIGH (ref 70–99)
Glucose-Capillary: 299 mg/dL — ABNORMAL HIGH (ref 70–99)
Glucose-Capillary: 391 mg/dL — ABNORMAL HIGH (ref 70–99)

## 2022-05-28 LAB — PROTIME-INR
INR: 1.3 — ABNORMAL HIGH (ref 0.8–1.2)
Prothrombin Time: 15.9 seconds — ABNORMAL HIGH (ref 11.4–15.2)

## 2022-05-28 LAB — BASIC METABOLIC PANEL
Anion gap: 13 (ref 5–15)
BUN: 11 mg/dL (ref 6–20)
CO2: 24 mmol/L (ref 22–32)
Calcium: 8.6 mg/dL — ABNORMAL LOW (ref 8.9–10.3)
Chloride: 92 mmol/L — ABNORMAL LOW (ref 98–111)
Creatinine, Ser: 0.88 mg/dL (ref 0.44–1.00)
GFR, Estimated: >60 mL/min (ref >60)
Glucose, Bld: 478 mg/dL — ABNORMAL HIGH (ref 70–99)
Potassium: 5.1 mmol/L (ref 3.5–5.1)
Sodium: 129 mmol/L — ABNORMAL LOW (ref 135–145)

## 2022-05-28 MED ORDER — IBUPROFEN 400 MG PO TABS: 400.0000 mg | ORAL_TABLET | Freq: Three times a day (TID) | ORAL | Status: DC

## 2022-05-28 MED ORDER — FENTANYL CITRATE (PF) 100 MCG/2ML IJ SOLN
INTRAMUSCULAR | Status: AC
  Filled 2022-05-28: qty 2

## 2022-05-28 MED ORDER — MIDAZOLAM HCL 2 MG/2ML IJ SOLN
INTRAMUSCULAR | Status: AC
  Filled 2022-05-28: qty 4

## 2022-05-28 MED ORDER — FENTANYL CITRATE (PF) 100 MCG/2ML IJ SOLN
INTRAMUSCULAR | Status: AC | PRN
  Administered 2022-05-28: 50 ug via INTRAVENOUS
  Administered 2022-05-28: 25 ug via INTRAVENOUS

## 2022-05-28 MED ORDER — SODIUM CHLORIDE 0.9% FLUSH
5.0000 mL | Freq: Three times a day (TID) | INTRAVENOUS | Status: DC
  Administered 2022-05-28 – 2022-05-31 (×8): 5 mL

## 2022-05-28 MED ORDER — ONDANSETRON HCL 4 MG/2ML IJ SOLN
INTRAMUSCULAR | Status: AC
  Filled 2022-05-28: qty 2

## 2022-05-28 MED ORDER — INSULIN GLARGINE-YFGN 100 UNIT/ML ~~LOC~~ SOLN
20.0000 [IU] | Freq: Two times a day (BID) | SUBCUTANEOUS | Status: DC
  Administered 2022-05-28 (×2): 20 [IU] via SUBCUTANEOUS
  Filled 2022-05-28 (×3): qty 0.2

## 2022-05-28 MED ORDER — IBUPROFEN 400 MG PO TABS
400.0000 mg | ORAL_TABLET | Freq: Three times a day (TID) | ORAL | Status: AC
  Administered 2022-05-28 – 2022-05-30 (×6): 400 mg via ORAL
  Filled 2022-05-28 (×6): qty 1

## 2022-05-28 MED ORDER — INSULIN GLARGINE-YFGN 100 UNIT/ML ~~LOC~~ SOLN
20.0000 [IU] | Freq: Two times a day (BID) | SUBCUTANEOUS | Status: DC
  Filled 2022-05-28: qty 0.2

## 2022-05-28 MED ORDER — MIDAZOLAM HCL 2 MG/2ML IJ SOLN
INTRAMUSCULAR | Status: AC | PRN
  Administered 2022-05-28: 2 mg via INTRAVENOUS
  Administered 2022-05-28: 1 mg via INTRAVENOUS

## 2022-05-28 MED ORDER — LIDOCAINE HCL (PF) 1 % IJ SOLN
10.0000 mL | Freq: Once | INTRAMUSCULAR | Status: AC
  Administered 2022-05-28: 10 mL

## 2022-05-28 NOTE — Plan of Care (Signed)
  Problem: Education: Goal: Knowledge of General Education information will improve Description: Including pain rating scale, medication(s)/side effects and non-pharmacologic comfort measures 05/28/2022 2318 by Liliane Channel, RN Outcome: Progressing 05/28/2022 2316 by Liliane Channel, RN Outcome: Progressing   Problem: Clinical Measurements: Goal: Cardiovascular complication will be avoided Outcome: Progressing   Problem: Activity: Goal: Risk for activity intolerance will decrease Outcome: Progressing   Problem: Pain Managment: Goal: General experience of comfort will improve Outcome: Progressing

## 2022-05-28 NOTE — Progress Notes (Signed)
[place] [facility] [place] NAME: Katie Hampton    MR#:  [id number]  DATE OF BIRTH:  1981-02-11  SUBJECTIVE:  Husband at bedside Sugars up  Flow grade fever S/p CT guided renal abscess drain placement by IR   VITALS:  Blood pressure (!) 103/54, pulse 92, temperature 98.5 F (36.9 C), temperature source Oral, resp. rate 16, height '5\' 3"'$  (1.6 m), weight 99.8 kg, last menstrual period 05/17/2022, SpO2 98 %.  PHYSICAL EXAMINATION:   GENERAL:  42 y.o.-year-old patient with no acute distress. Obese LUNGS: Normal breath sounds bilaterally, no wheezing CARDIOVASCULAR: S1, S2 normal. No murmur  intermittent tachycardia ABDOMEN: Soft, nontender, nondistended. Bowel sounds present. Right flank drain + EXTREMITIES: No  edema b/l.    NEUROLOGIC: nonfocal  patient is alert and awake SKIN: No obvious rash, lesion, or ulcer.   LABORATORY PANEL:  CBC Recent Labs  Lab 05/28/22 0854  WBC 15.4*  HGB 10.4*  HCT 31.4*  PLT 692*     Chemistries  Recent Labs  Lab 05/24/22 1306 05/25/22 0451 05/28/22 0854  NA 132*   < > 129*  K 3.9   < > 5.1  CL 97*   < > 92*  CO2 23   < > 24  GLUCOSE 234*   < > 478*  BUN 7   < > 11  CREATININE 0.82   < > 0.88  CALCIUM 8.5*   < > 8.6*  AST 23  --   --   ALT 17  --   --   ALKPHOS 231*  --   --   BILITOT 0.5  --   --    < > = values in this interval not displayed.     RADIOLOGY:  CT GUIDED VISCERAL FLUID DRAIN BY PERC CATH  Result Date: 05/28/2022 INDICATION: 42 year old female with history of acute right pyelonephritis complicated by abscess formation without significant clinical improvement on intravenous antibiotics. EXAM: 1. CT IMAGE GUIDED DRAINAGE BY PERCUTANEOUS CATHETER 2. CT IMAGE GUIDED DRAINAGE BY PERCUTANEOUS CATHETER COMPARISON:  05/24/2022 MEDICATIONS: The patient is currently admitted to the hospital and receiving intravenous antibiotics. The antibiotics were administered within an  appropriate time frame prior to the initiation of the procedure. ANESTHESIA/SEDATION: Moderate (conscious) sedation was employed during this procedure. A total of Versed 3 mg and Fentanyl 75 mcg was administered intravenously. Moderate Sedation Time: 36 minutes. The patient's level of consciousness and vital signs were monitored continuously by radiology nursing throughout the procedure under my direct supervision. CONTRAST:  None COMPLICATIONS: None immediate. PROCEDURE: RADIATION DOSE REDUCTION: This exam was performed according to the departmental dose-optimization program which includes automated exposure control, adjustment of the mA and/or kV according to patient size and/or use of iterative reconstruction technique. Informed written consent was obtained from the patient after a discussion of the risks, benefits and alternatives to treatment. The patient was placed on the CT gantry and a pre procedural CT was performed re-demonstrating the known abscess/fluid collections within the right kidney. The procedure was planned. A timeout was performed prior to the initiation of the procedure. The right flank was prepped and draped in the usual sterile fashion. The overlying soft tissues were anesthetized with 1% lidocaine with epinephrine. Appropriate trajectory was planned with the use of a 22 gauge spinal needle. An 18 gauge trocar needle was advanced into the interpolar abscess/fluid collection and a short Amplatz super stiff wire was coiled within the collection. Appropriate positioning was confirmed with  a limited CT scan. The tract was serially dilated allowing placement of a 10 French mini loop Skater drainage catheter. Appropriate positioning was confirmed with a limited postprocedural CT scan. Approximately 5 ml of purulent fluid was aspirated. The tube was connected to a bulb suction and sutured in place. Attention was then turned toward the upper pole abscess. The overlying soft tissues were anesthetized  with 1% lidocaine with epinephrine. Appropriate trajectory was planned with the use of a 22 gauge spinal needle. An 18 gauge trocar needle was advanced into the upper pole abscess/fluid collection and a short Amplatz super stiff wire was coiled within the collection. Appropriate positioning was confirmed with a limited CT scan. The tract was serially dilated allowing placement of a 10 French mini loop Skater drainage catheter. Appropriate positioning was confirmed with a limited postprocedural CT scan. Approximately 5 ml of purulent fluid was aspirated. The tube was connected to a bulb suction and sutured in place. A dressing was placed. The patient tolerated the procedure well without immediate post procedural complication. IMPRESSION: Successful CT guided placement of 10 French mini loop Skater drain catheters into the interpolar and upper pole renal abscesses with aspiration of 10 mL of purulent fluid. Samples were sent to the laboratory as requested by the ordering clinical team. Ruthann Cancer, MD [facility] Radiology Electronically Signed   By: Ruthann Cancer M.D.   On: 05/28/2022 13:02   CT GUIDED VISCERAL FLUID DRAIN BY PERC CATH  Result Date: 05/28/2022 INDICATION: 42 year old female with history of acute right pyelonephritis complicated by abscess formation without significant clinical improvement on intravenous antibiotics. EXAM: 1. CT IMAGE GUIDED DRAINAGE BY PERCUTANEOUS CATHETER 2. CT IMAGE GUIDED DRAINAGE BY PERCUTANEOUS CATHETER COMPARISON:  05/24/2022 MEDICATIONS: The patient is currently admitted to the hospital and receiving intravenous antibiotics. The antibiotics were administered within an appropriate time frame prior to the initiation of the procedure. ANESTHESIA/SEDATION: Moderate (conscious) sedation was employed during this procedure. A total of Versed 3 mg and Fentanyl 75 mcg was administered intravenously. Moderate Sedation Time: 36  minutes. The patient's level of consciousness and vital signs were monitored continuously by radiology nursing throughout the procedure under my direct supervision. CONTRAST:  None COMPLICATIONS: None immediate. PROCEDURE: RADIATION DOSE REDUCTION: This exam was performed according to the departmental dose-optimization program which includes automated exposure control, adjustment of the mA and/or kV according to patient size and/or use of iterative reconstruction technique. Informed written consent was obtained from the patient after a discussion of the risks, benefits and alternatives to treatment. The patient was placed on the CT gantry and a pre procedural CT was performed re-demonstrating the known abscess/fluid collections within the right kidney. The procedure was planned. A timeout was performed prior to the initiation of the procedure. The right flank was prepped and draped in the usual sterile fashion. The overlying soft tissues were anesthetized with 1% lidocaine with epinephrine. Appropriate trajectory was planned with the use of a 22 gauge spinal needle. An 18 gauge trocar needle was advanced into the interpolar abscess/fluid collection and a short Amplatz super stiff wire was coiled within the collection. Appropriate positioning was confirmed with a limited CT scan. The tract was serially dilated allowing placement of a 10 French mini loop Skater drainage catheter. Appropriate positioning was confirmed with a limited postprocedural CT scan. Approximately 5 ml of purulent fluid was aspirated. The tube was connected to a bulb suction and sutured in place. Attention was then turned toward the upper pole abscess.  The overlying soft tissues were anesthetized with 1% lidocaine with epinephrine. Appropriate trajectory was planned with the use of a 22 gauge spinal needle. An 18 gauge trocar needle was advanced into the upper pole abscess/fluid collection and a short Amplatz super stiff wire was coiled within  the collection. Appropriate positioning was confirmed with a limited CT scan. The tract was serially dilated allowing placement of a 10 French mini loop Skater drainage catheter. Appropriate positioning was confirmed with a limited postprocedural CT scan. Approximately 5 ml of purulent fluid was aspirated. The tube was connected to a bulb suction and sutured in place. A dressing was placed. The patient tolerated the procedure well without immediate post procedural complication. IMPRESSION: Successful CT guided placement of 10 French mini loop Skater drain catheters into the interpolar and upper pole renal abscesses with aspiration of 10 mL of purulent fluid. Samples were sent to the laboratory as requested by the ordering clinical team. Ruthann Cancer, MD [facility] Radiology Electronically Signed   By: Ruthann Cancer M.D.   On: 05/28/2022 13:02    Assessment and Plan  Ameera Bouley is a 42 y.o. female with medical history significant of pyelonephritis, HLD, diabetes mellitus, peripheral neuropathy, obesity, who presents with right flank pain, burning on urination.   Patient was diagnosed with UTI and started on Bactrim on 3/1.  She continues to have burning on urination, mild dysuria and increased urinary frequency.  She also noticed little blood in her urine occasionally. She developed right flank pain, which is constant, aching, moderate, nonradiating.    CT abd/pelvis Inflammatory changes involving the right kidney with poor enhancement and multiple fluid collections consistent with abscess formation and underlying pyelonephritis. Non-obstructing right-sided renal stone.  Severe sepsis due to renal abscess, right: Patient admits criteria for severe sepsis with WBC 18.8, heart rate of 112, lactic acid 3.1.   -- consulted Dr. Diamantina Providence of urology, who recommended IR consultation for possible drainage.   --Since pt is hemodynamically stable, Dr. Serafina Royals  recommended continuation of IV antibiotics. If clinical course worsens or she fails to improve, call back to IR for consideration of image guide drain.  -IV cefepime -Follow-up of blood culture  negative and urine culture <10K insignificant growth --Tolerating po diet --remains afebrile --WBC 18K--15K --3/11--cont with fever +tachycardia. IR to do CT guided renal abscess drainage today --3/12--s/p Right renal abscess drain placement. F/u Cultures. --ID consult with Dr Delaine Lame   Diabetes mellitus (type 1 diabetes per patient, with peripheral neuropathy)uncontrolled:  -Patient is taking Novolin and NPH insulin 20-22 units in AM and 18-20 units in PM -SSI --d/w DM coodinator--will do Semglee  20 units bid with SSI and adjust according to sugars --A1c7.4%   Peripheral neuropathy -Neurontin   HLD (hyperlipidemia) -Fenofibrate   New onset atrial fibrillation [facility]): EKG seems to have new onset A flutter/A-fib with variable AV block.  Heart rate 100-110.  CHADS2 score is  2(DM and female). Likely triggered by severe sepsis. -Consulted Dr. Rockey Situ for cardiology--pt back in NSR -pt goes in afib intermittently. Started on Flecainde by Dr Caryl Comes. No AC at present. Will need Zio monitor at d/c --cont BB + Flecanide   Obesity (BMI 30-39.9): Body weight 100.7 kg, BMI 39.33 -Healthy diet, and exercise -Encourage losing weight     Family communication :none today  Consults :IR, Urology, cardiology, ID CODE STATUS:full  DVT Prophylaxis : Level of care: Telemetry Medical Status is: Inpatient Remains inpatient appropriate because: renal abscess  TOTAL TIME TAKING CARE OF THIS PATIENT: 35 minutes.  >50% time spent on counselling and coordination of care  Note: This dictation was prepared with Dragon dictation along with smaller phrase technology. Any transcriptional errors that result from this process are unintentional.  Fritzi Mandes M.D    [facility]   CC: Primary care  physician; Candida Peeling, PA-C

## 2022-05-28 NOTE — Progress Notes (Signed)
Progress Note  Patient Name: Katie Hampton Date of Encounter: 05/28/2022  Primary Cardiologist: New - consult by Caryl Comes  Subjective   This morning, while ambulating to the bathroom, she developed shortness of breath and nausea with associated dizziness.  Throughout the day today, she has been in atrial flutter with variable AV block, rate controlled.  She is status post CT-guided renal abscess drain placement by IR.  Inpatient Medications    Scheduled Meds:  enoxaparin (LOVENOX) injection  0.5 mg/kg Subcutaneous Q24H   fenofibrate  160 mg Oral Daily   flecainide  75 mg Oral Q12H   gabapentin  600 mg Oral QHS   ibuprofen  400 mg Oral TID   insulin aspart  0-15 Units Subcutaneous TID WC   insulin aspart  0-5 Units Subcutaneous QHS   insulin glargine-yfgn  20 Units Subcutaneous BID   metoprolol tartrate  25 mg Oral BID   sodium chloride flush  5 mL Intracatheter Q8H   Continuous Infusions:  sodium chloride Stopped (05/27/22 0722)   ceFEPime (MAXIPIME) IV 2 g (05/28/22 0453)   sodium chloride Stopped (05/24/22 1744)   PRN Meds: sodium chloride, acetaminophen, metoprolol tartrate, morphine injection, ondansetron (ZOFRAN) IV, mouth rinse, oxyCODONE-acetaminophen, SUMAtriptan   Vital Signs    Vitals:   05/28/22 1230 05/28/22 1245 05/28/22 1300 05/28/22 1333  BP: (!) 100/45 102/68 (!) 112/55 (!) 103/54  Pulse: 86 65 71 92  Resp: '19 17 19 16  '$ Temp:    98.5 F (36.9 C)  TempSrc:    Oral  SpO2: 95% 96% 96% 98%  Weight:      Height:        Intake/Output Summary (Last 24 hours) at 05/28/2022 1515 Last data filed at 05/28/2022 0800 Gross per 24 hour  Intake 422.11 ml  Output --  Net 422.11 ml   Filed Weights   05/24/22 1255 05/28/22 1039  Weight: 100.7 kg 99.8 kg    Telemetry    Atrial flutter with variable AV block with ventricular rates in the 80s bpm - Personally Reviewed  ECG    Atypical atrial flutter with nonspecific ST-T changes - Personally  Reviewed  Physical Exam   GEN: No acute distress.   Neck: No JVD. Cardiac: Irregularly irregular, no murmurs, rubs, or gallops.  Respiratory: Clear to auscultation bilaterally.  GI: Soft, nontender, non-distended.   MS: No edema; No deformity. Neuro:  Alert and oriented x 3; Nonfocal.  Psych: Normal affect.  Labs    Chemistry Recent Labs  Lab 05/24/22 1306 05/25/22 0451 05/28/22 0854  NA 132* 131* 129*  K 3.9 4.0 5.1  CL 97* 100 92*  CO2 '23 22 24  '$ GLUCOSE 234* 343* 478*  BUN '7 6 11  '$ CREATININE 0.82 0.78 0.88  CALCIUM 8.5* 8.0* 8.6*  PROT 7.7  --   --   ALBUMIN 2.6*  --   --   AST 23  --   --   ALT 17  --   --   ALKPHOS 231*  --   --   BILITOT 0.5  --   --   GFRNONAA >60 >60 >60  ANIONGAP '12 9 13     '$ Hematology Recent Labs  Lab 05/24/22 1306 05/25/22 0451 05/28/22 0854  WBC 18.8* 15.2* 15.4*  RBC 3.70* 3.11* 3.40*  HGB 11.2* 9.2* 10.4*  HCT 35.3* 29.2* 31.4*  MCV 95.4 93.9 92.4  MCH 30.3 29.6 30.6  MCHC 31.7 31.5 33.1  RDW 13.2 13.2 13.2  PLT 590* 524*  692*    Cardiac EnzymesNo results for input(s): "TROPONINI" in the last 168 hours. No results for input(s): "TROPIPOC" in the last 168 hours.   BNPNo results for input(s): "BNP", "PROBNP" in the last 168 hours.   DDimer No results for input(s): "DDIMER" in the last 168 hours.   Radiology    CT GUIDED VISCERAL FLUID DRAIN BY PERC CATH  Result Date: 05/28/2022 IMPRESSION: Successful CT guided placement of 10 French mini loop Skater drain catheters into the interpolar and upper pole renal abscesses with aspiration of 10 mL of purulent fluid. Samples were sent to the laboratory as requested by the ordering clinical team. Ruthann Cancer, MD [facility] Radiology Electronically Signed   By: Ruthann Cancer M.D.   On: 05/28/2022 13:02   CT GUIDED VISCERAL FLUID DRAIN BY PERC CATH  Result Date: 05/28/2022 IMPRESSION: Successful CT guided placement of 10 French  mini loop Skater drain catheters into the interpolar and upper pole renal abscesses with aspiration of 10 mL of purulent fluid. Samples were sent to the laboratory as requested by the ordering clinical team. Ruthann Cancer, MD [facility] Radiology Electronically Signed   By: Ruthann Cancer M.D.   On: 05/28/2022 13:02    Cardiac Studies   2D echo 05/26/2022: 1. Left ventricular ejection fraction, by estimation, is 60 to 65%. The  left ventricle has normal function. The left ventricle has no regional  wall motion abnormalities. Left ventricular diastolic parameters are  indeterminate.   2. Right ventricular systolic function is normal. The right ventricular  size is normal. There is normal pulmonary artery systolic pressure. The  estimated right ventricular systolic pressure is [id number] mmHg.   3. The mitral valve is normal in structure. No evidence of mitral valve  regurgitation. No evidence of mitral stenosis.   4. The aortic valve has an indeterminant number of cusps. Aortic valve  regurgitation is not visualized. No aortic stenosis is present.   5. The inferior vena cava is normal in size with greater than 50%  respiratory variability, suggesting right atrial pressure of 3 mmHg.   6. There is a very small patent foramen ovale with minimal left to right  shunting across the atrial septum.   Patient Profile     42 y.o. female with history of diabetes, pyelonephritis, recurrent UTIs, and nephrolithiasis who is being evaluated for new onset A-fib/flutter.  Assessment & Plan    1.  New onset A-fib/flutter: -Likely in the setting of her illness of sepsis secondary to renal abscess status post drain placement -Throughout the admission, she has had paroxysms of A-fib/flutter with subsequent spontaneous conversion to sinus rhythm -She has been evaluated by EP with recommendation to be maintained on flecainide and metoprolol -Not currently on  anticoagulation given low CHA2DS2-VASc -No current indication for DCCV given these episodes have been intermittent and are self terminating -Patient will need ZIO XT at discharge with EP follow-up thereafter -Potassium at goal, TSH normal       For questions or updates, please contact [facility] Please consult [email or web address] for contact info under Cardiology/STEMI.    Signed, Christell Faith, PA-C [facility] Pager: [phone] 05/28/2022, 3:15 PM

## 2022-05-28 NOTE — Inpatient Diabetes Management (Signed)
Inpatient Diabetes Program Recommendations  AACE/ADA: New Consensus Statement on Inpatient Glycemic Control (2015)  Target Ranges:  Prepandial:   less than 140 mg/dL      Peak postprandial:   less than 180 mg/dL (1-2 hours)      Critically ill patients:  140 - 180 mg/dL   Lab Results  Component Value Date   GLUCAP 385 (H) 05/28/2022   HGBA1C 7.4 (H) 05/25/2022    Latest Reference Range & Units 05/27/22 07:40 05/27/22 11:15 05/27/22 16:23 05/27/22 21:22 05/28/22 08:10 05/28/22 09:33 05/28/22 10:50  Glucose-Capillary 70 - 99 mg/dL 407 (H) 268 (H) 322 (H) 336 (H) 427 (H) 434 (H) 385 (H)  (H): Data is abnormally high   Diabetes history: DM1 (requires basal, meal coverage & correction) Outpatient Diabetes medications: Novolin N 20-22 units ac breakfast, 18-20 units ac supper, Novolin R 20-24 units ac breakfast, 18-20 units ac supper Current orders for Inpatient glycemic control: Semglee 16 units q 12 hrs., Novolog 0-15 units tid, 0-5 units hs  Inpatient Diabetes Program Recommendations:   Patient currently NPO. Please consider: -Increase Semglee to 20 units bid  Thank you, Nani Gasser. Chasady Longwell, RN, MSN, CDE  Diabetes Coordinator Inpatient Glycemic Control Team Team Pager [phone] (8am-5pm) 05/28/2022 11:18 AM

## 2022-05-28 NOTE — Progress Notes (Signed)
Per cardiac monitoring patient HR in 120's. RN went to room to check patient. RN observed patient in bed. Per patient, recently walked to the bathroom and returned to bed. Patient denies chest pain, but has right flank pain, mild nausea and SOB when ambulating to the bathroom.  RN administered ordered EKG and placed in patient's chart. CBG 427. RN paged MD.      05/28/22 0805  Vitals  Temp 99.8 F (37.7 C)  Temp Source Oral  BP 125/74  MAP (mmHg) 87  BP Location Right Arm  BP Method Automatic  Patient Position (if appropriate) Lying  Pulse Rate (!) 103  Pulse Rate Source Monitor  Resp 20  MEWS COLOR  MEWS Score Color Green  Oxygen Therapy  SpO2 98 %  O2 Device Room Air  Pain Assessment  Pain Scale 0-10  Pain Score 5  Pain Type Acute pain  Pain Location Flank  Pain Orientation Right  Pain Descriptors / Indicators Dull;Sharp;Aching  Pain Frequency Constant  Pain Onset On-going  Pain Intervention(s) Medication (See eMAR)  Multiple Pain Sites No  MEWS Score  MEWS Temp 0  MEWS Systolic 0  MEWS Pulse 1  MEWS RR 0  MEWS LOC 0  MEWS Score 1

## 2022-05-28 NOTE — Consult Note (Signed)
NAME: Katie Hampton  DOB: 1980/05/06  MRN: [id number]  Date/Time: 05/28/2022 3:44 PM  REQUESTING PROVIDER: Dr.Patel Subjective:  REASON FOR CONSULT: renal abscess ? Katie Hampton is a 42 y.o. with a history of DM, peripheral neuropathy, pyelonephritis, blind  left eye, glaucoma right eye  Presents with rt flank pain and dysuria Patient states for the past 2 years she has been having recurrent pyelonephritis. In February she went to [facility] and had a urine culture and was given ciprofloxacin.  She took it for few days but as the symptoms were not improving she came to our ED on 05/17/2022 Urine culture was sent and she was prescribed Bactrim. But she  continued to have dysuria and also worsening right flank pain and came to the ED. She is also had fevers up to 103 in the past 2 weeks. 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 As she was not feeling better  and was developing flank pain she came to the ED on 05/24/22  05/24/22  BP 120/58 !  Temp 98.6 F (37 C)  Pulse Rate 108 !  Resp 16  SpO2 97 %  Labs showed leucocytosis  Latest Reference Range & Units 05/24/22  WBC 4.0 - 10.5 K/uL 18.8 (H)  Hemoglobin 12.0 - 15.0 g/dL 11.2 (L)  HCT 36.0 - 46.0 % 35.3 (L)  Platelets 150 - 400 K/uL 590 (H)  Creatinine 0.44 - 1.00 mg/dL 0.82  Blood culture sent Ct abdomen showed enlargement of the right kidney with perinephric stranding and thickening.  There were several areas of poor enhancement along the mid to upper aspect of the right kidney with several fluid collections.   3 mm lower pole stone on the right kidney was also seen.   Started on cefepime Seen by urology who recommended IR drain the abscess which was done today and culture sent I am seeing the patient for the same Past Medical History:  Diagnosis Date   Diabetes mellitus    Pyelonephritis     Past Surgical History:  Procedure Laterality Date   CHOLECYSTECTOMY     EYE SURGERY Bilateral     TUBAL LIGATION Bilateral     Social History   Socioeconomic History   Marital status: Married    Spouse name: Not on file   Number of children: Not on file   Years of education: Not on file   Highest education level: Not on file  Occupational History   Not on file  Tobacco Use   Smoking status: Former    Types: Cigarettes   Smokeless tobacco: Never  Vaping Use   Vaping Use: Never used  Substance and Sexual Activity   Alcohol use: No   Drug use: Never   Sexual activity: Not on file  Other Topics Concern   Not on file  Social History Narrative   Not on file   Social Determinants of Health   Financial Resource Strain: Not on file  Food Insecurity: No Food Insecurity (05/24/2022)   Hunger Vital Sign    Worried About Running Out of Food in the Last Year: Never true    Ran Out of Food in the Last Year: Never true  Transportation Needs: No Transportation Needs (05/24/2022)   PRAPARE - Hydrologist (Medical): No    Lack of Transportation (Non-Medical): No  Physical Activity: Not on file  Stress: Not on file  Social Connections: Not on file  Intimate Partner Violence: Not At  Risk (05/24/2022)   Humiliation, Afraid, Rape, and Kick questionnaire    Fear of Current or Ex-Partner: No    Emotionally Abused: No    Physically Abused: No    Sexually Abused: No    Family History  Problem Relation Age of Onset   Kidney disease Sister    Colon cancer Maternal Aunt    No Known Allergies I? Current Facility-Administered Medications  Medication Dose Route Frequency Provider Last Rate Last Admin   0.9 %  sodium chloride infusion   Intravenous PRN Fritzi Mandes, MD   Stopped at 05/27/22 [id number]   acetaminophen (TYLENOL) tablet 650 mg  650 mg Oral Q6H PRN Ivor Costa, MD   650 mg at 05/26/22 1930   ceFEPIme (MAXIPIME) 2 g in sodium chloride 0.9 % 100 mL IVPB  2 g Intravenous Q8H Lorin Picket, RPH 200 mL/hr at 05/28/22 1525 2 g at 05/28/22 1525   enoxaparin  (LOVENOX) injection 50 mg  0.5 mg/kg Subcutaneous Q24H Hallaji, Sheema M, RPH   50 mg at 05/26/22 [id number]   fenofibrate tablet 160 mg  160 mg Oral Daily Ivor Costa, MD   160 mg at 05/28/22 [id number]   flecainide (TAMBOCOR) tablet 75 mg  75 mg Oral Q12H Deboraha Sprang, MD   75 mg at 05/28/22 [id number]   gabapentin (NEURONTIN) capsule 600 mg  600 mg Oral QHS Ivor Costa, MD   600 mg at 05/27/22 2127   ibuprofen (ADVIL) tablet 400 mg  400 mg Oral TID Fritzi Mandes, MD   400 mg at 05/28/22 1506   insulin aspart (novoLOG) injection 0-15 Units  0-15 Units Subcutaneous TID WC Fritzi Mandes, MD   5 Units at 05/28/22 1507   insulin aspart (novoLOG) injection 0-5 Units  0-5 Units Subcutaneous QHS Fritzi Mandes, MD   4 Units at 05/27/22 2214   insulin glargine-yfgn (SEMGLEE) injection 20 Units  20 Units Subcutaneous BID Fritzi Mandes, MD   20 Units at 05/28/22 1507   metoprolol tartrate (LOPRESSOR) injection 2.5 mg  2.5 mg Intravenous Q2H PRN Ivor Costa, MD   2.5 mg at 05/25/22 0421   metoprolol tartrate (LOPRESSOR) tablet 25 mg  25 mg Oral BID Fritzi Mandes, MD   25 mg at 05/28/22 [id number]   morphine (PF) 2 MG/ML injection 2 mg  2 mg Intravenous Q4H PRN Ivor Costa, MD   2 mg at 05/28/22 1348   ondansetron (ZOFRAN) injection 4 mg  4 mg Intravenous Q8H PRN Ivor Costa, MD   4 mg at 05/28/22 1245   Oral care mouth rinse  15 mL Mouth Rinse PRN Fritzi Mandes, MD       oxyCODONE-acetaminophen (PERCOCET/ROXICET) 5-325 MG per tablet 1 tablet  1 tablet Oral Q4H PRN Ivor Costa, MD   1 tablet at 05/28/22 1514   sodium chloride 0.9 % bolus 1,000 mL  1,000 mL Intravenous Once Ivor Costa, MD   Stopped at 05/24/22 1744   sodium chloride flush (NS) 0.9 % injection 5 mL  5 mL Intracatheter Q8H Suttle, Rosanne Ashing, MD   5 mL at 05/28/22 1526   SUMAtriptan (IMITREX) tablet 100 mg  100 mg Oral Q2H PRN Ivor Costa, MD         Abtx:  Anti-infectives (From admission, onward)    Start     Dose/Rate Route Frequency Ordered Stop   05/24/22 2130  ceFEPIme  (MAXIPIME) 2 g in sodium chloride 0.9 % 100 mL IVPB        2 g 200  mL/hr over 30 Minutes Intravenous Every 8 hours 05/24/22 1658     05/24/22 1330  ceFEPIme (MAXIPIME) 2 g in sodium chloride 0.9 % 100 mL IVPB        2 g 200 mL/hr over 30 Minutes Intravenous  Once 05/24/22 1323 05/24/22 1548       REVIEW OF SYSTEMS:  Const:  fever,  chills, negative weight loss Eyes: 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 Partial eyesight right eye , ENT: negative coryza, negative sore throat Resp: negative cough, hemoptysis, dyspnea Cards: negative for chest pain, palpitations, lower extremity edema GU: As above GI: Negative for abdominal pain, diarrhea, bleeding, constipation Skin: negative for rash and pruritus Heme: negative for easy bruising and gum/nose bleeding MS: Weakness  Neurolo:negative for headaches, dizziness, vertigo, memory problems  Psych: negative for feelings of anxiety, depression  Endocrine:  diabetes Allergy/Immunology-none  VITALS:  BP (!) 103/54 (BP Location: Right Arm)   Pulse 92   Temp 98.5 F (36.9 C) (Oral)   Resp 16   Ht '5\' 3"'$  (1.6 m)   Wt 99.8 kg   LMP 05/17/2022 (Within Days)   SpO2 98%   BMI 38.97 kg/m  LDA Right renal drain PHYSICAL EXAM:  General: Alert, cooperative, no distress, appears stated age.  Head: Normocephalic, without obvious abnormality, atraumatic. Eyes: Conjunctivae clear, anicteric sclerae.  ENT Nares normal. No drainage or sinus tenderness. Lips, mucosa, and tongue normal. No Thrush Neck: Supple, symmetrical, no adenopathy, thyroid: non tender no carotid bruit and no JVD. Back: No CVA tenderness. Lungs: Clear to auscultation bilaterally. No Wheezing or Rhonchi. No rales. Heart: Regular rate and rhythm, no murmur, rub or gallop. Abdomen: Soft, tender over the right flank. Drain present Extremities: atraumatic, no cyanosis. No edema. No clubbing Skin: No rashes or lesions. Or bruising Lymph: Cervical,  supraclavicular normal. Neurologic: Grossly non-focal Pertinent Labs Lab Results CBC    Component Value Date/Time   WBC 15.4 (H) 05/28/2022 0854   RBC 3.40 (L) 05/28/2022 0854   HGB 10.4 (L) 05/28/2022 0854   HCT 31.4 (L) 05/28/2022 0854   PLT 692 (H) 05/28/2022 0854   MCV 92.4 05/28/2022 0854   MCH 30.6 05/28/2022 0854   MCHC 33.1 05/28/2022 0854   RDW 13.2 05/28/2022 0854   LYMPHSABS 1.1 05/28/2022 0854   MONOABS 0.7 05/28/2022 0854   EOSABS 0.1 05/28/2022 0854   BASOSABS 0.1 05/28/2022 0854       Latest Ref Rng & Units 05/28/2022    8:54 AM 05/25/2022    4:51 AM 05/24/2022    1:06 PM  CMP  Glucose 70 - 99 mg/dL 478  343  234   BUN 6 - 20 mg/dL '11  6  7   '$ Creatinine 0.44 - 1.00 mg/dL 0.88  0.78  0.82   Sodium 135 - 145 mmol/L 129  131  132   Potassium 3.5 - 5.1 mmol/L 5.1  4.0  3.9   Chloride 98 - 111 mmol/L 92  100  97   CO2 22 - 32 mmol/L '24  22  23   '$ Calcium 8.9 - 10.3 mg/dL 8.6  8.0  8.5   Total Protein 6.5 - 8.1 g/dL   7.7   Total Bilirubin 0.3 - 1.2 mg/dL   0.5   Alkaline Phos 38 - 126 U/L   231   AST 15 - 41 U/L   23   ALT 0 - 44 U/L   17       Microbiology: Recent  Results (from the past 240 hour(s))  Urine Culture     Status: Abnormal   Collection Time: 05/24/22  1:06 PM   Specimen: Urine, Clean Catch  Result Value Ref Range Status   Specimen Description   Final    URINE, CLEAN CATCH Performed at [facility], [address] [place], [place] [address]    Special Requests   Final    NONE Performed at [facility], [address] [place], [place] [address]    Culture (A)  Final    <10,000 COLONIES/mL INSIGNIFICANT GROWTH Performed at [facility] [address], [place], [place] [address]    Report Status 05/26/2022 FINAL  Final  Blood culture (routine x 2)     Status: None (Preliminary result)   Collection Time: 05/24/22  1:31 PM   Specimen: BLOOD RIGHT ARM  Result Value Ref Range Status   Specimen Description BLOOD  RIGHT ARM  Final   Special Requests   Final    BOTTLES DRAWN AEROBIC AND ANAEROBIC Blood Culture results may not be optimal due to an excessive volume of blood received in culture bottles   Culture   Final    NO GROWTH 4 DAYS Performed at [facility], [address] [place], [place] [address]    Report Status PENDING  Incomplete  Blood culture (routine x 2)     Status: None (Preliminary result)   Collection Time: 05/24/22  1:31 PM   Specimen: BLOOD RIGHT FOREARM  Result Value Ref Range Status   Specimen Description BLOOD RIGHT FOREARM  Final   Special Requests   Final    BOTTLES DRAWN AEROBIC AND ANAEROBIC Blood Culture adequate volume   Culture   Final    NO GROWTH 4 DAYS Performed at [facility], [address] [place], [place] [address]    Report Status PENDING  Incomplete  Resp panel by RT-PCR (RSV, Flu A&B, Covid) Anterior Nasal Swab     Status: None   Collection Time: 05/24/22  1:31 PM   Specimen: Anterior Nasal Swab  Result Value Ref Range Status   SARS Coronavirus 2 by RT PCR NEGATIVE NEGATIVE Final    Comment: (NOTE) SARS-CoV-2 target nucleic acids are NOT DETECTED.  The SARS-CoV-2 RNA is generally detectable in upper respiratory specimens during the acute phase of infection. The lowest concentration of SARS-CoV-2 viral copies this assay can detect is 138 copies/mL. A negative result does not preclude SARS-Cov-2 infection and should not be used as the sole basis for treatment or other patient management decisions. A negative result may occur with  improper specimen collection/handling, submission of specimen other than nasopharyngeal swab, presence of viral mutation(s) within the areas targeted by this assay, and inadequate number of viral copies(<138 copies/mL). A negative result must be combined with clinical observations, patient history, and epidemiological information. The expected result is Negative.  Fact Sheet for Patients:   EntrepreneurPulse.com.au  Fact Sheet for Healthcare Providers:  IncredibleEmployment.be  This test is no t yet approved or cleared by the [place] FDA and  has been authorized for detection and/or diagnosis of SARS-CoV-2 by FDA under an Emergency Use Authorization (EUA). This EUA will remain  in effect (meaning this test can be used) for the duration of the COVID-19 declaration under Section 564(b)(1) of the Act, 21 U.S.C.section 360bbb-3(b)(1), unless the authorization is terminated  or revoked sooner.       Influenza A by PCR NEGATIVE NEGATIVE Final   Influenza B  by PCR NEGATIVE NEGATIVE Final    Comment: (NOTE) The Xpert Xpress SARS-CoV-2/FLU/RSV plus assay is intended as an aid in the diagnosis of influenza from Nasopharyngeal swab specimens and should not be used as a sole basis for treatment. Nasal washings and aspirates are unacceptable for Xpert Xpress SARS-CoV-2/FLU/RSV testing.  Fact Sheet for Patients: EntrepreneurPulse.com.au  Fact Sheet for Healthcare Providers: IncredibleEmployment.be  This test is not yet approved or cleared by the [place] FDA and has been authorized for detection and/or diagnosis of SARS-CoV-2 by FDA under an Emergency Use Authorization (EUA). This EUA will remain in effect (meaning this test can be used) for the duration of the COVID-19 declaration under Section 564(b)(1) of the Act, 21 U.S.C. section 360bbb-3(b)(1), unless the authorization is terminated or revoked.     Resp Syncytial Virus by PCR NEGATIVE NEGATIVE Final    Comment: (NOTE) Fact Sheet for Patients: EntrepreneurPulse.com.au  Fact Sheet for Healthcare Providers: IncredibleEmployment.be  This test is not yet approved or cleared by the [place] FDA and has been authorized for detection and/or diagnosis of SARS-CoV-2 by FDA under an Emergency Use  Authorization (EUA). This EUA will remain in effect (meaning this test can be used) for the duration of the COVID-19 declaration under Section 564(b)(1) of the Act, 21 U.S.C. section 360bbb-3(b)(1), unless the authorization is terminated or revoked.  Performed at [facility], [address] [place], [place] [address]     IMAGING RESULTS:  I have personally reviewed the films Rt renal  abscesses and pyelonephritis  ? Impression/Recommendation Complicated urinary tract infection with multiple right-sided renal abscesses, and large kidney and perinephric stranding. Patient is currently on cefepime In February  the urine culture at [facility] showed Klebsiella pneumonia which was pretty sensitive.  She was on ciprofloxacin and then Bactrim but had not responded to treatment. She has now a drain in one of the abscesses.  Will need to repeat imaging later to look for resolution. Await culture from the drain placement today. Blood cultures pending  Diabetes mellitus.  Insulin-dependent.  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  Glaucoma.  Has a stent ? ? ___________________________________________________ Discussed the management with the patient and the hospitalist. Note:  This document was prepared using Dragon voice recognition software and may include unintentional dictation errors.

## 2022-05-28 NOTE — Plan of Care (Signed)
  Problem: Clinical Measurements: Goal: Cardiovascular complication will be avoided Outcome: Progressing   Problem: Pain Managment: Goal: General experience of comfort will improve Outcome: Progressing   

## 2022-05-28 NOTE — Procedures (Signed)
Interventional Radiology Procedure Note  Procedure: CT guided right renal abscess drain placement x2  Findings: Please refer to procedural dictation for full description. 10 Fr pigtail drainage catheters placed in right interpolar and upper pole dominant abscesses.  Approximately 10 mL purulent aspirate from both.  Sample sent for culture.  Drains placed to bulb suction.  Complications: None immediate  Estimated Blood Loss: <  67m  Recommendations: Keep to bulb suction. IR will follow.   DRuthann Cancer MD

## 2022-05-29 DIAGNOSIS — A419 Sepsis, unspecified organism: Secondary | ICD-10-CM | POA: Diagnosis not present

## 2022-05-29 DIAGNOSIS — I4892 Unspecified atrial flutter: Secondary | ICD-10-CM

## 2022-05-29 DIAGNOSIS — E1040 Type 1 diabetes mellitus with diabetic neuropathy, unspecified: Secondary | ICD-10-CM | POA: Diagnosis not present

## 2022-05-29 DIAGNOSIS — N151 Renal and perinephric abscess: Secondary | ICD-10-CM | POA: Diagnosis not present

## 2022-05-29 LAB — CULTURE, BLOOD (ROUTINE X 2)
Culture: NO GROWTH
Culture: NO GROWTH
Special Requests: ADEQUATE

## 2022-05-29 LAB — GLUCOSE, CAPILLARY
Glucose-Capillary: 426 mg/dL — ABNORMAL HIGH (ref 70–99)
Glucose-Capillary: 424 mg/dL — ABNORMAL HIGH (ref 70–99)
Glucose-Capillary: 319 mg/dL — ABNORMAL HIGH (ref 70–99)
Glucose-Capillary: 109 mg/dL — ABNORMAL HIGH (ref 70–99)
Glucose-Capillary: 130 mg/dL — ABNORMAL HIGH (ref 70–99)
Glucose-Capillary: 69 mg/dL — ABNORMAL LOW (ref 70–99)
Glucose-Capillary: 138 mg/dL — ABNORMAL HIGH (ref 70–99)

## 2022-05-29 MED ORDER — INSULIN GLARGINE-YFGN 100 UNIT/ML ~~LOC~~ SOLN
25.0000 [IU] | Freq: Two times a day (BID) | SUBCUTANEOUS | Status: DC
  Administered 2022-05-29 – 2022-05-31 (×4): 25 [IU] via SUBCUTANEOUS
  Filled 2022-05-29 (×5): qty 0.25

## 2022-05-29 MED ORDER — METOPROLOL TARTRATE 25 MG PO TABS
25.0000 mg | ORAL_TABLET | Freq: Once | ORAL | Status: AC
  Administered 2022-05-29: 25 mg via ORAL
  Filled 2022-05-29: qty 1

## 2022-05-29 MED ORDER — INSULIN GLARGINE-YFGN 100 UNIT/ML ~~LOC~~ SOLN
22.0000 [IU] | Freq: Two times a day (BID) | SUBCUTANEOUS | Status: DC
  Administered 2022-05-29: 22 [IU] via SUBCUTANEOUS
  Filled 2022-05-29 (×2): qty 0.22

## 2022-05-29 MED ORDER — SODIUM CHLORIDE 0.9 % IV SOLN
1.0000 g | Freq: Three times a day (TID) | INTRAVENOUS | Status: DC
  Administered 2022-05-29 – 2022-05-31 (×6): 1 g via INTRAVENOUS
  Filled 2022-05-29: qty 20
  Filled 2022-05-29: qty 1
  Filled 2022-05-29 (×6): qty 20

## 2022-05-29 MED ORDER — FLECAINIDE ACETATE 100 MG PO TABS
100.0000 mg | ORAL_TABLET | Freq: Two times a day (BID) | ORAL | Status: DC
  Administered 2022-05-29 – 2022-05-31 (×4): 100 mg via ORAL
  Filled 2022-05-29 (×5): qty 1

## 2022-05-29 MED ORDER — METOPROLOL TARTRATE 50 MG PO TABS
50.0000 mg | ORAL_TABLET | Freq: Two times a day (BID) | ORAL | Status: DC
  Administered 2022-05-29 – 2022-05-31 (×4): 50 mg via ORAL
  Filled 2022-05-29 (×4): qty 1

## 2022-05-29 MED ORDER — INSULIN ASPART 100 UNIT/ML IJ SOLN
10.0000 [IU] | Freq: Three times a day (TID) | INTRAMUSCULAR | Status: DC
  Administered 2022-05-30 – 2022-05-31 (×3): 10 [IU] via SUBCUTANEOUS
  Filled 2022-05-29 (×3): qty 1

## 2022-05-29 MED ORDER — INSULIN ASPART 100 UNIT/ML IJ SOLN
6.0000 [IU] | Freq: Three times a day (TID) | INTRAMUSCULAR | Status: DC
  Administered 2022-05-29 (×2): 6 [IU] via SUBCUTANEOUS

## 2022-05-29 NOTE — Progress Notes (Signed)
Referring Physician(s): * No referring provider recorded for this case *  Supervising Physician: Corrie Mckusick  Patient Status:  [facility] - In-pt  Chief Complaint:  Renal abscess s/p drain placement x2 05/28/22  Subjective:  Patient alert, watching TV in bed at time of exam. She is accompanied by her husband at bedside. She reports feeling somewhat better today compared to yesterday before the drain placement.  Allergies: Patient has no known allergies.  Medications: Prior to Admission medications   Medication Sig Start Date End Date Taking? Authorizing Provider  Aspirin-Caffeine (BAYER BACK & BODY PO) Take 1 tablet by mouth daily as needed (pain).   Yes [provider]  Azelastine HCl 137 MCG/SPRAY SOLN Place 2 sprays into both nostrils 2 (two) times daily. 05/13/22  Yes [provider]  fenofibrate micronized (LOFIBRA) 200 MG capsule Take 200 mg by mouth daily. 05/03/22  Yes [provider]  gabapentin (NEURONTIN) 600 MG tablet Take 600 mg by mouth at bedtime. 01/30/21  Yes [provider]  HYDROcodone-acetaminophen (NORCO) 5-325 MG tablet Take 1 tablet by mouth every 4 (four) hours as needed for moderate pain. 05/17/22  Yes Duanne Guess, PA-C  ibuprofen (ADVIL) 800 MG tablet Take 800 mg by mouth 3 (three) times daily as needed. 05/03/22  Yes [provider]  insulin NPH (HUMULIN N,NOVOLIN N) 100 UNIT/ML injection Inject 18-22 Units into the skin 2 (two) times daily before a meal. Per patients sliding scale: breakfast - 20-22 units,  supper 18-20 units   Yes [provider]  insulin regular (NOVOLIN R,HUMULIN R) 100 units/mL injection Inject 18-24 Units into the skin 2 (two) times daily before a meal. Per patients sliding scale: breakfast 20-24 units, supper 18-20   Yes [provider]  LUTEIN PO Take 1 capsule by mouth 2 (two) times daily.   Yes [provider]  Multiple Vitamin (MULTIVITAMIN WITH MINERALS) TABS  Take 1 tablet by mouth daily.   Yes [provider]  Multiple Vitamins-Minerals (AIRBORNE PO) Take 1 tablet by mouth daily.   Yes [provider]  naproxen sodium (ALEVE) 220 MG tablet Take 880 mg by mouth 2 (two) times daily as needed (pain).   Yes [provider]  ondansetron (ZOFRAN-ODT) 4 MG disintegrating tablet Take 1 tablet (4 mg total) by mouth every 8 (eight) hours as needed for nausea or vomiting. 05/17/22  Yes Duanne Guess, PA-C  ondansetron (ZOFRAN-ODT) 8 MG disintegrating tablet Take 8 mg by mouth every 8 (eight) hours as needed. 05/13/22  Yes [provider]  SUMAtriptan (IMITREX) 100 MG tablet Take 100 mg by mouth every 2 (two) hours as needed. 05/03/22  Yes [provider]  Vitamin D, Ergocalciferol, (DRISDOL) 1.25 MG (50000 UNIT) CAPS capsule Take 50,000 Units by mouth once a week. 05/03/22  Yes [provider]  cefdinir (OMNICEF) 300 MG capsule Take 1 capsule (300 mg total) by mouth 2 (two) times daily. Patient not taking: Reported on 05/24/2022 03/09/21   Letitia Neri L, PA-C  potassium chloride SA (KLOR-CON M) 20 MEQ tablet Take 1 tablet (20 mEq total) by mouth daily. Patient not taking: Reported on 05/24/2022 03/09/21   Johnn Hai, PA-C     Vital Signs: BP 127/79   Pulse 78   Temp 98 F (36.7 C)   Resp 18   Ht '5\' 3"'$  (1.6 m)   Wt 220 lb (99.8 kg)   LMP 05/17/2022 (Within Days)   SpO2 98%   BMI 38.97  kg/m   Physical Exam Vitals reviewed.  Cardiovascular:     Rate and Rhythm: Normal rate.  Pulmonary:     Effort: Pulmonary effort is normal.  Musculoskeletal:     Comments: Right CVA drains x2 in place for renal abscesses. Sutures and stat locks intact.   Skin:    General: Skin is warm and dry.  Neurological:     Mental Status: She is alert and oriented to person, place, and time.  Psychiatric:        Mood and Affect: Mood normal.        Behavior: Behavior normal.        Thought Content: Thought  content normal.        Judgment: Judgment normal.     Drain Location: CVA right x2 Size: Fr size: 10 Fr x2 Date of placement: 05/28/22  Currently to: Drain collection device: suction bulb 24 hour output:  Output by Drain (mL) 05/27/22 0701 - 05/27/22 1900 05/27/22 1901 - 05/28/22 0700 05/28/22 0701 - 05/28/22 1900 05/28/22 1901 - 05/29/22 0700 05/29/22 0701 - 05/29/22 1511  Patient has no LDAs of requested type attached.    Interval imaging/drain manipulation:  None  Current examination: Flushes/aspirates easily.  Insertion site unremarkable x2. Suture and stat lock in place x2. Dressed appropriately x2.  ~73m of slightly purulent appearing material in inferior drain. ~54mof serosanguineous output in superior drain.   Imaging: CT GUIDED VISCERAL FLUID DRAIN BY PERC CATH  Result Date: 05/28/2022 INDICATION: [age]ear old female with history of acute right pyelonephritis complicated by abscess formation without significant clinical improvement on intravenous antibiotics. EXAM: 1. CT IMAGE GUIDED DRAINAGE BY PERCUTANEOUS CATHETER 2. CT IMAGE GUIDED DRAINAGE BY PERCUTANEOUS CATHETER COMPARISON:  05/24/2022 MEDICATIONS: The patient is currently admitted to the hospital and receiving intravenous antibiotics. The antibiotics were administered within an appropriate time frame prior to the initiation of the procedure. ANESTHESIA/SEDATION: Moderate (conscious) sedation was employed during this procedure. A total of Versed 3 mg and Fentanyl 75 mcg was administered intravenously. Moderate Sedation Time: 36 minutes. The patient's level of consciousness and vital signs were monitored continuously by radiology nursing throughout the procedure under my direct supervision. CONTRAST:  None COMPLICATIONS: None immediate. PROCEDURE: RADIATION DOSE REDUCTION: This exam was performed according to the departmental dose-optimization program which includes automated exposure control, adjustment of the mA and/or kV  according to patient size and/or use of iterative reconstruction technique. Informed written consent was obtained from the patient after a discussion of the risks, benefits and alternatives to treatment. The patient was placed on the CT gantry and a pre procedural CT was performed re-demonstrating the known abscess/fluid collections within the right kidney. The procedure was planned. A timeout was performed prior to the initiation of the procedure. The right flank was prepped and draped in the usual sterile fashion. The overlying soft tissues were anesthetized with 1% lidocaine with epinephrine. Appropriate trajectory was planned with the use of a 22 gauge spinal needle. An 18 gauge trocar needle was advanced into the interpolar abscess/fluid collection and a short Amplatz super stiff wire was coiled within the collection. Appropriate positioning was confirmed with a limited CT scan. The tract was serially dilated allowing placement of a 10 French mini loop Skater drainage catheter. Appropriate positioning was confirmed with a limited postprocedural CT scan. Approximately 5 ml of purulent fluid was aspirated. The tube was connected to a bulb suction and sutured in place. Attention was then turned toward the upper pole abscess. The overlying soft  tissues were anesthetized with 1% lidocaine with epinephrine. Appropriate trajectory was planned with the use of a 22 gauge spinal needle. An 18 gauge trocar needle was advanced into the upper pole abscess/fluid collection and a short Amplatz super stiff wire was coiled within the collection. Appropriate positioning was confirmed with a limited CT scan. The tract was serially dilated allowing placement of a 10 French mini loop Skater drainage catheter. Appropriate positioning was confirmed with a limited postprocedural CT scan. Approximately 5 ml of purulent fluid was aspirated. The tube was connected to a bulb suction and sutured in place. A dressing was placed. The patient  tolerated the procedure well without immediate post procedural complication. IMPRESSION: Successful CT guided placement of 10 French mini loop Skater drain catheters into the interpolar and upper pole renal abscesses with aspiration of 10 mL of purulent fluid. Samples were sent to the laboratory as requested by the ordering clinical team. Ruthann Cancer, MD [facility] Radiology Electronically Signed   By: Ruthann Cancer M.D.   On: 05/28/2022 13:02   CT GUIDED VISCERAL FLUID DRAIN BY PERC CATH  Result Date: 05/28/2022 INDICATION: 42 year old female with history of acute right pyelonephritis complicated by abscess formation without significant clinical improvement on intravenous antibiotics. EXAM: 1. CT IMAGE GUIDED DRAINAGE BY PERCUTANEOUS CATHETER 2. CT IMAGE GUIDED DRAINAGE BY PERCUTANEOUS CATHETER COMPARISON:  05/24/2022 MEDICATIONS: The patient is currently admitted to the hospital and receiving intravenous antibiotics. The antibiotics were administered within an appropriate time frame prior to the initiation of the procedure. ANESTHESIA/SEDATION: Moderate (conscious) sedation was employed during this procedure. A total of Versed 3 mg and Fentanyl 75 mcg was administered intravenously. Moderate Sedation Time: 36 minutes. The patient's level of consciousness and vital signs were monitored continuously by radiology nursing throughout the procedure under my direct supervision. CONTRAST:  None COMPLICATIONS: None immediate. PROCEDURE: RADIATION DOSE REDUCTION: This exam was performed according to the departmental dose-optimization program which includes automated exposure control, adjustment of the mA and/or kV according to patient size and/or use of iterative reconstruction technique. Informed written consent was obtained from the patient after a discussion of the risks, benefits and alternatives to treatment. The patient was placed on the CT gantry and a pre  procedural CT was performed re-demonstrating the known abscess/fluid collections within the right kidney. The procedure was planned. A timeout was performed prior to the initiation of the procedure. The right flank was prepped and draped in the usual sterile fashion. The overlying soft tissues were anesthetized with 1% lidocaine with epinephrine. Appropriate trajectory was planned with the use of a 22 gauge spinal needle. An 18 gauge trocar needle was advanced into the interpolar abscess/fluid collection and a short Amplatz super stiff wire was coiled within the collection. Appropriate positioning was confirmed with a limited CT scan. The tract was serially dilated allowing placement of a 10 French mini loop Skater drainage catheter. Appropriate positioning was confirmed with a limited postprocedural CT scan. Approximately 5 ml of purulent fluid was aspirated. The tube was connected to a bulb suction and sutured in place. Attention was then turned toward the upper pole abscess. The overlying soft tissues were anesthetized with 1% lidocaine with epinephrine. Appropriate trajectory was planned with the use of a 22 gauge spinal needle. An 18 gauge trocar needle was advanced into the upper pole abscess/fluid collection and a short Amplatz super stiff wire was coiled within the collection. Appropriate positioning was confirmed with a limited CT scan. The tract was serially  dilated allowing placement of a 10 French mini loop Skater drainage catheter. Appropriate positioning was confirmed with a limited postprocedural CT scan. Approximately 5 ml of purulent fluid was aspirated. The tube was connected to a bulb suction and sutured in place. A dressing was placed. The patient tolerated the procedure well without immediate post procedural complication. IMPRESSION: Successful CT guided placement of 10 French mini loop Skater drain catheters into the interpolar and upper pole renal abscesses with aspiration of 10 mL of purulent  fluid. Samples were sent to the laboratory as requested by the ordering clinical team. Ruthann Cancer, MD [facility] Radiology Electronically Signed   By: Ruthann Cancer M.D.   On: 05/28/2022 13:02   ECHOCARDIOGRAM COMPLETE  Result Date: 05/26/2022    ECHOCARDIOGRAM REPORT   Patient Name:   Katie Hampton Date of Exam: 05/26/2022 Medical Rec #:  [id number]        Height:       63.0 in Accession #:    [id number]       Weight:       222.0 lb Date of Birth:  01/28/1981       BSA:          2.021 m Patient Age:    74 years         BP:           120/82 mmHg Patient Gender: F                HR:           88 bpm. Exam Location:  [facility] Procedure: 2D Echo, Color Doppler, Cardiac Doppler and Intracardiac            Opacification Agent Indications:     Atrial Fibrillation I48.91  History:         Patient has no prior history of Echocardiogram examinations.                  Risk Factors:Diabetes and Morbid Obesity.  Sonographer:     L. Thornton-Maynard Referring Phys:  [id number] SHERI HAMMOCK Diagnosing Phys: Ida Rogue MD  Sonographer Comments: Suboptimal apical window and patient is obese. IMPRESSIONS  1. Left ventricular ejection fraction, by estimation, is 60 to 65%. The left ventricle has normal function. The left ventricle has no regional wall motion abnormalities. Left ventricular diastolic parameters are indeterminate.  2. Right ventricular systolic function is normal. The right ventricular size is normal. There is normal pulmonary artery systolic pressure. The estimated right ventricular systolic pressure is [id number] mmHg.  3. The mitral valve is normal in structure. No evidence of mitral valve regurgitation. No evidence of mitral stenosis.  4. The aortic valve has an indeterminant number of cusps. Aortic valve regurgitation is not visualized. No aortic stenosis is present.  5. The inferior vena cava is normal in size with greater than 50% respiratory variability,  suggesting right atrial pressure of 3 mmHg.  6. There is a very small patent foramen ovale with minimal left to right shunting across the atrial septum. FINDINGS  Left Ventricle: Left ventricular ejection fraction, by estimation, is 60 to 65%. The left ventricle has normal function. The left ventricle has no regional wall motion abnormalities. Definity contrast agent was given IV to delineate the left ventricular  endocardial borders. The left ventricular internal cavity size was normal in size. There is no left ventricular hypertrophy. Left ventricular diastolic parameters are indeterminate. Right Ventricle: The right ventricular size is normal. No  increase in right ventricular wall thickness. Right ventricular systolic function is normal. There is normal pulmonary artery systolic pressure. The tricuspid regurgitant velocity is 2.40 m/s, and  with an assumed right atrial pressure of 3 mmHg, the estimated right ventricular systolic pressure is [id number] mmHg. Left Atrium: Left atrial size was normal in size. Right Atrium: Right atrial size was normal in size. Pericardium: There is no evidence of pericardial effusion. Mitral Valve: The mitral valve is normal in structure. No evidence of mitral valve regurgitation. No evidence of mitral valve stenosis. Tricuspid Valve: The tricuspid valve is normal in structure. Tricuspid valve regurgitation is mild . No evidence of tricuspid stenosis. Aortic Valve: The aortic valve has an indeterminant number of cusps. Aortic valve regurgitation is not visualized. No aortic stenosis is present. Aortic valve mean gradient measures 6.0 mmHg. Aortic valve peak gradient measures 10.3 mmHg. Aortic valve area, by VTI measures 1.34 cm. Pulmonic Valve: The pulmonic valve was normal in structure. Pulmonic valve regurgitation is not visualized. No evidence of pulmonic stenosis. Aorta: The aortic root is normal in size and structure. Venous: The inferior vena cava is normal in size with greater  than 50% respiratory variability, suggesting right atrial pressure of 3 mmHg. IAS/Shunts: No atrial level shunt detected by color flow Doppler. A small patent foramen ovale is detected with predominantly left to right shunting across the atrial septum.  LEFT VENTRICLE PLAX 2D LVIDd:         4.30 cm     Diastology LVIDs:         3.10 cm     LV e' medial:    11.10 cm/s LV PW:         1.10 cm     LV E/e' medial:  9.5 LV IVS:        0.90 cm     LV e' lateral:   11.70 cm/s LVOT diam:     1.80 cm     LV E/e' lateral: 9.1 LV SV:         36 LV SV Index:   18 LVOT Area:     2.54 cm  LV Volumes (MOD) LV vol d, MOD A2C: 51.8 ml LV vol d, MOD A4C: 81.4 ml LV vol s, MOD A2C: 20.3 ml LV vol s, MOD A4C: 24.5 ml LV SV MOD A2C:     31.5 ml LV SV MOD A4C:     81.4 ml LV SV MOD BP:      45.7 ml LEFT ATRIUM             Index        RIGHT ATRIUM           Index LA diam:        4.20 cm 2.08 cm/m   RA Area:     20.50 cm LA Vol (A2C):   34.6 ml 17.12 ml/m  RA Volume:   62.70 ml  31.02 ml/m LA Vol (A4C):   33.6 ml 16.62 ml/m LA Biplane Vol: 34.9 ml 17.27 ml/m  AORTIC VALVE                     PULMONIC VALVE AV Area (Vmax):    1.43 cm      PV Vmax:       1.14 m/s AV Area (Vmean):   1.35 cm      PV Peak grad:  5.2 mmHg AV Area (VTI):     1.34 cm AV Vmax:  160.50 cm/s AV Vmean:          111.500 cm/s AV VTI:            0.267 m AV Peak Grad:      10.3 mmHg AV Mean Grad:      6.0 mmHg LVOT Vmax:         90.10 cm/s LVOT Vmean:        59.200 cm/s LVOT VTI:          0.141 m LVOT/AV VTI ratio: 0.53  AORTA Ao Root diam: 2.80 cm Ao Asc diam:  2.60 cm MV E velocity: 106.00 cm/s  TRICUSPID VALVE MV A velocity: 87.00 cm/s   TR Peak grad:   23.0 mmHg MV E/A ratio:  1.22         TR Vmax:        240.00 cm/s                              SHUNTS                             Systemic VTI:  0.14 m                             Systemic Diam: 1.80 cm Ida Rogue MD Electronically signed by Ida Rogue MD Signature Date/Time: 05/26/2022/2:20:58  PM    Final     Labs:  CBC: Recent Labs    05/17/22 1747 05/24/22 1306 05/25/22 0451 05/28/22 0854  WBC 10.7* 18.8* 15.2* 15.4*  HGB 11.7* 11.2* 9.2* 10.4*  HCT 35.4* 35.3* 29.2* 31.4*  PLT 312 590* 524* 692*    COAGS: Recent Labs    05/24/22 1704 05/28/22 0854  INR 1.1 1.3*  APTT 30  --     BMP: Recent Labs    05/17/22 1747 05/24/22 1306 05/25/22 0451 05/28/22 0854  NA 132* 132* 131* 129*  K 3.9 3.9 4.0 5.1  CL 101 97* 100 92*  CO2 20* '23 22 24  '$ GLUCOSE 403* 234* 343* 478*  BUN '12 7 6 11  '$ CALCIUM 8.2* 8.5* 8.0* 8.6*  CREATININE 0.93 0.82 0.78 0.88  GFRNONAA >60 >60 >60 >60    LIVER FUNCTION TESTS: Recent Labs    05/24/22 1306  BILITOT 0.5  AST 23  ALT 17  ALKPHOS 231*  PROT 7.7  ALBUMIN 2.6*    Assessment and Plan:   Right renal abscesses -Drains appear to be functional, both drains flush and aspirate easily. Rare gram negative rods on gram stain, culture pending. Prior urine culture from February showed Klebsiella pneumoniae. Patient and her husband report no concerns regarding her drain at this time. Patient currently being followed by ID as well as hospitalist, IR team will defer to them for management of infection and will continue to be available should any questions or concerns regarding the drain arise.   Plan: Continue TID flushes with 5 cc NS. Record output Q shift. Dressing changes QD or PRN if soiled.  Call IR APP or on call IR MD if difficulty flushing or sudden change in drain output.  Repeat imaging/possible drain injection once output < 10 mL/QD (excluding flush material). Consideration for drain removal if output is < 10 mL/QD (excluding flush material), pending discussion with the providing surgical service.  Discharge planning: Please contact IR APP or on call  IR MD prior to patient d/c to ensure appropriate follow up plans are in place. Typically patient will follow up with IR clinic 10-14 days post d/c for repeat  imaging/possible drain injection. IR scheduler will contact patient with date/time of appointment. Patient will need to flush drain QD with 5 cc NS, record output QD, dressing changes every 2-3 days or earlier if soiled.   IR will continue to follow - please call with questions or concerns.  Electronically Signed: Lura Em, PA-C 05/29/2022, 3:06 PM   I spent a total of 25 Minutes at the the patient's bedside AND on the patient's hospital floor or unit, greater than 50% of which was counseling/coordinating care for renal abscesses.

## 2022-05-29 NOTE — Plan of Care (Signed)
  Problem: Health Behavior/Discharge Planning: Goal: Ability to manage health-related needs will improve Outcome: Progressing   Problem: Clinical Measurements: Goal: Will remain free from infection Outcome: Progressing   Problem: Clinical Measurements: Goal: Cardiovascular complication will be avoided Outcome: Progressing   Problem: Activity: Goal: Risk for activity intolerance will decrease Outcome: Progressing   Problem: Nutrition: Goal: Adequate nutrition will be maintained Outcome: Progressing   Problem: Pain Managment: Goal: General experience of comfort will improve Outcome: Progressing

## 2022-05-29 NOTE — Inpatient Diabetes Management (Signed)
Inpatient Diabetes Program Recommendations  AACE/ADA: New Consensus Statement on Inpatient Glycemic Control (2015)  Target Ranges:  Prepandial:   less than 140 mg/dL      Peak postprandial:   less than 180 mg/dL (1-2 hours)      Critically ill patients:  140 - 180 mg/dL   Lab Results  Component Value Date   GLUCAP 426 (H) 05/29/2022   HGBA1C 7.4 (H) 05/25/2022    Review of Glycemic Control  Diabetes history: DM1 (requires basal, meal coverage & correction) Outpatient Diabetes medications: Novolin N 20-22 units ac breakfast, 18-20 units ac supper, Novolin R 20-24 units ac breakfast, 18-20 units ac supper Current orders for Inpatient glycemic control: Semglee 20 units q 12 hrs., Novolog 0-15 units tid, 0-5 units hs  Inpatient Diabetes Program Recommendations:   Since patient is now eating, please consider: -Add Novolog 6 units tid meal coverage if eats 50% -Increase Semglee to 22 units bid Discussed with Dr. Manuella Ghazi.  Thank you, Nani Gasser. Herny Scurlock, RN, MSN, CDE  Diabetes Coordinator Inpatient Glycemic Control Team Team Pager [phone] (8am-5pm) 05/29/2022 8:39 AM

## 2022-05-29 NOTE — Hospital Course (Addendum)
Katie Hampton is a 42 y.o. female with medical history significant of pyelonephritis, HLD, diabetes mellitus, peripheral neuropathy, obesity, who presents with right flank pain, burning on urination.   Patient was diagnosed with UTI and started on Bactrim on 3/1.  She continues to have burning on urination, mild dysuria and increased urinary frequency.  She also noticed little blood in her urine occasionally. She developed right flank pain, which is constant, aching, moderate, nonradiating.     CT abd/pelvis Inflammatory changes involving the right kidney with poor enhancement and multiple fluid collections consistent with abscess formation and underlying pyelonephritis. Non-obstructing right-sided renal stone.   3/11--urology and cardiology consult 3/12--ID consult, s/p Right renal abscess drain placement. 3/13 remains in atrial flutter, on metoprolol and flecainide.  Sugars running high so adjusted insulin 3/14: Checking renal ultrasound to evaluate her abscess

## 2022-05-29 NOTE — Progress Notes (Signed)
Date of Admission:  05/24/2022      ID: Starlett Hockert is a 42 y.o. female Principal Problem:   Renal abscess, right Active Problems:   Severe sepsis ([place])   Diabetes mellitus ([place])   Peripheral neuropathy   Paroxysmal atrial fibrillation (HCC)   HLD (hyperlipidemia)   Obesity (BMI 30-39.9)   Atrial flutter (H[facility])    Subjective: Feeling better Eating lunch Husband at bed side  Medications:   enoxaparin (LOVENOX) injection  0.5 mg/kg Subcutaneous Q24H   fenofibrate  160 mg Oral Daily   flecainide  75 mg Oral Q12H   gabapentin  600 mg Oral QHS   ibuprofen  400 mg Oral TID   insulin aspart  0-15 Units Subcutaneous TID WC   insulin aspart  0-5 Units Subcutaneous QHS   insulin aspart  6 Units Subcutaneous TID WC   insulin glargine-yfgn  22 Units Subcutaneous BID   metoprolol tartrate  25 mg Oral Once   metoprolol tartrate  50 mg Oral BID   sodium chloride flush  5 mL Intracatheter Q8H    Objective: Vital signs in last 24 hours: Patient Vitals for the past 24 hrs:  BP Temp Temp src Pulse Resp SpO2  05/29/22 0855 117/68 98 F (36.7 C) -- (!) 103 18 98 %  05/29/22 0551 -- 97.6 F (36.4 C) Oral -- -- --  05/29/22 0443 98/68 (!) 97.4 F (36.3 C) -- 73 16 97 %  05/29/22 0027 (!) 109/50 97.9 F (36.6 C) -- 73 16 95 %  05/28/22 2104 118/65 -- -- -- -- --  05/28/22 1906 90/76 98.3 F (36.8 C) Oral 92 16 98 %  05/28/22 1740 90/75 98.3 F (36.8 C) Oral 96 16 98 %  05/28/22 1333 (!) 103/54 98.5 F (36.9 C) Oral 92 16 98 %  05/28/22 1300 (!) 112/55 -- -- 71 19 96 %  05/28/22 1245 102/68 -- -- 65 17 96 %  05/28/22 1230 (!) 100/45 -- -- 86 19 95 %  05/28/22 1215 124/65 -- -- 86 18 99 %  05/28/22 1213 124/65 -- -- 88 14 99 %  05/28/22 1212 114/64 -- -- 92 20 99 %  05/28/22 1206 130/67 -- -- 91 20 100 %  05/28/22 1201 124/76 -- -- 93 20 100 %  05/28/22 1156 120/69 -- -- 88 18 100 %  05/28/22 1150 (!) 127/94 -- -- 89 19 99 %  05/28/22 1146 125/80 -- -- 83 17 99 %   05/28/22 1140 (!) 143/81 -- -- 87 16 99 %  05/28/22 1136 130/75 -- -- 88 14 100 %  05/28/22 1131 122/80 -- -- 92 17 100 %      PHYSICAL EXAM:  General: Alert, cooperative, no distress, appears stated age.  Lungs: b/l air entry- decreased base Abdomen: Soft, rt flank 2 drains present Extremities: atraumatic, no cyanosis. No edema. No clubbing Skin: No rashes or lesions. Or bruising Lymph: Cervical, supraclavicular normal. Neurologic: Grossly non-focal  Labs    Latest Ref Rng & Units 05/28/2022    8:54 AM 05/25/2022    4:51 AM 05/24/2022    1:06 PM  CBC  WBC 4.0 - 10.5 K/uL 15.4  15.2  18.8   Hemoglobin 12.0 - 15.0 g/dL 10.4  9.2  11.2   Hematocrit 36.0 - 46.0 % 31.4  29.2  35.3   Platelets 150 - 400 K/uL 692  524  590        Latest Ref Rng & Units 05/28/2022  8:54 AM 05/25/2022    4:51 AM 05/24/2022    1:06 PM  CMP  Glucose 70 - 99 mg/dL 478  343  234   BUN 6 - 20 mg/dL '11  6  7   '$ Creatinine 0.44 - 1.00 mg/dL 0.88  0.78  0.82   Sodium 135 - 145 mmol/L 129  131  132   Potassium 3.5 - 5.1 mmol/L 5.1  4.0  3.9   Chloride 98 - 111 mmol/L 92  100  97   CO2 22 - 32 mmol/L '24  22  23   '$ Calcium 8.9 - 10.3 mg/dL 8.6  8.0  8.5   Total Protein 6.5 - 8.1 g/dL   7.7   Total Bilirubin 0.3 - 1.2 mg/dL   0.5   Alkaline Phos 38 - 126 U/L   231   AST 15 - 41 U/L   23   ALT 0 - 44 U/L   17     Microbiology:  Studies/Results: CT GUIDED VISCERAL FLUID DRAIN BY PERC CATH  Result Date: 05/28/2022 INDICATION: 42 year old female with history of acute right pyelonephritis complicated by abscess formation without significant clinical improvement on intravenous antibiotics. EXAM: 1. CT IMAGE GUIDED DRAINAGE BY PERCUTANEOUS CATHETER 2. CT IMAGE GUIDED DRAINAGE BY PERCUTANEOUS CATHETER COMPARISON:  05/24/2022 MEDICATIONS: The patient is currently admitted to the hospital and receiving intravenous antibiotics. The antibiotics were administered within an appropriate time frame prior to the initiation of  the procedure. ANESTHESIA/SEDATION: Moderate (conscious) sedation was employed during this procedure. A total of Versed 3 mg and Fentanyl 75 mcg was administered intravenously. Moderate Sedation Time: 36 minutes. The patient's level of consciousness and vital signs were monitored continuously by radiology nursing throughout the procedure under my direct supervision. CONTRAST:  None COMPLICATIONS: None immediate. PROCEDURE: RADIATION DOSE REDUCTION: This exam was performed according to the departmental dose-optimization program which includes automated exposure control, adjustment of the mA and/or kV according to patient size and/or use of iterative reconstruction technique. Informed written consent was obtained from the patient after a discussion of the risks, benefits and alternatives to treatment. The patient was placed on the CT gantry and a pre procedural CT was performed re-demonstrating the known abscess/fluid collections within the right kidney. The procedure was planned. A timeout was performed prior to the initiation of the procedure. The right flank was prepped and draped in the usual sterile fashion. The overlying soft tissues were anesthetized with 1% lidocaine with epinephrine. Appropriate trajectory was planned with the use of a 22 gauge spinal needle. An 18 gauge trocar needle was advanced into the interpolar abscess/fluid collection and a short Amplatz super stiff wire was coiled within the collection. Appropriate positioning was confirmed with a limited CT scan. The tract was serially dilated allowing placement of a 10 French mini loop Skater drainage catheter. Appropriate positioning was confirmed with a limited postprocedural CT scan. Approximately 5 ml of purulent fluid was aspirated. The tube was connected to a bulb suction and sutured in place. Attention was then turned toward the upper pole abscess. The overlying soft tissues were anesthetized with 1% lidocaine with epinephrine. Appropriate  trajectory was planned with the use of a 22 gauge spinal needle. An 18 gauge trocar needle was advanced into the upper pole abscess/fluid collection and a short Amplatz super stiff wire was coiled within the collection. Appropriate positioning was confirmed with a limited CT scan. The tract was serially dilated allowing placement of a 10 French mini loop Skater drainage catheter. Appropriate  positioning was confirmed with a limited postprocedural CT scan. Approximately 5 ml of purulent fluid was aspirated. The tube was connected to a bulb suction and sutured in place. A dressing was placed. The patient tolerated the procedure well without immediate post procedural complication. IMPRESSION: Successful CT guided placement of 10 French mini loop Skater drain catheters into the interpolar and upper pole renal abscesses with aspiration of 10 mL of purulent fluid. Samples were sent to the laboratory as requested by the ordering clinical team. Ruthann Cancer, MD [facility] Radiology Electronically Signed   By: Ruthann Cancer M.D.   On: 05/28/2022 13:02   CT GUIDED VISCERAL FLUID DRAIN BY PERC CATH  Result Date: 05/28/2022 INDICATION: 42 year old female with history of acute right pyelonephritis complicated by abscess formation without significant clinical improvement on intravenous antibiotics. EXAM: 1. CT IMAGE GUIDED DRAINAGE BY PERCUTANEOUS CATHETER 2. CT IMAGE GUIDED DRAINAGE BY PERCUTANEOUS CATHETER COMPARISON:  05/24/2022 MEDICATIONS: The patient is currently admitted to the hospital and receiving intravenous antibiotics. The antibiotics were administered within an appropriate time frame prior to the initiation of the procedure. ANESTHESIA/SEDATION: Moderate (conscious) sedation was employed during this procedure. A total of Versed 3 mg and Fentanyl 75 mcg was administered intravenously. Moderate Sedation Time: 36 minutes. The patient's level of consciousness and  vital signs were monitored continuously by radiology nursing throughout the procedure under my direct supervision. CONTRAST:  None COMPLICATIONS: None immediate. PROCEDURE: RADIATION DOSE REDUCTION: This exam was performed according to the departmental dose-optimization program which includes automated exposure control, adjustment of the mA and/or kV according to patient size and/or use of iterative reconstruction technique. Informed written consent was obtained from the patient after a discussion of the risks, benefits and alternatives to treatment. The patient was placed on the CT gantry and a pre procedural CT was performed re-demonstrating the known abscess/fluid collections within the right kidney. The procedure was planned. A timeout was performed prior to the initiation of the procedure. The right flank was prepped and draped in the usual sterile fashion. The overlying soft tissues were anesthetized with 1% lidocaine with epinephrine. Appropriate trajectory was planned with the use of a 22 gauge spinal needle. An 18 gauge trocar needle was advanced into the interpolar abscess/fluid collection and a short Amplatz super stiff wire was coiled within the collection. Appropriate positioning was confirmed with a limited CT scan. The tract was serially dilated allowing placement of a 10 French mini loop Skater drainage catheter. Appropriate positioning was confirmed with a limited postprocedural CT scan. Approximately 5 ml of purulent fluid was aspirated. The tube was connected to a bulb suction and sutured in place. Attention was then turned toward the upper pole abscess. The overlying soft tissues were anesthetized with 1% lidocaine with epinephrine. Appropriate trajectory was planned with the use of a 22 gauge spinal needle. An 18 gauge trocar needle was advanced into the upper pole abscess/fluid collection and a short Amplatz super stiff wire was coiled within the collection. Appropriate positioning was  confirmed with a limited CT scan. The tract was serially dilated allowing placement of a 10 French mini loop Skater drainage catheter. Appropriate positioning was confirmed with a limited postprocedural CT scan. Approximately 5 ml of purulent fluid was aspirated. The tube was connected to a bulb suction and sutured in place. A dressing was placed. The patient tolerated the procedure well without immediate post procedural complication. IMPRESSION: Successful CT guided placement of 10 French mini loop Skater drain catheters into the  interpolar and upper pole renal abscesses with aspiration of 10 mL of purulent fluid. Samples were sent to the laboratory as requested by the ordering clinical team. Ruthann Cancer, MD [facility] Radiology Electronically Signed   By: Ruthann Cancer M.D.   On: 05/28/2022 13:02     Assessment/Plan: Complicated urinary tract infection with multiple right-sided renal abscesses, and large kidney and perinephric stranding. Patient is currently on cefepime In February  the urine culture at [facility] showed Klebsiella pneumonia which was pretty sensitive ( R to nitrofurantoin /ampicillin).  She was on ciprofloxacin and then Bactrim but had not responded to treatment. She has 2 drains.  Will need to repeat imaging later to look for resolution. Await culture from the drain- gram stain shows gram neg rod Blood cultures so far NG   Diabetes mellitus.  Insulin-dependent. 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  Glaucoma.  Has a stent  Discussed the management with the patient and her husband

## 2022-05-29 NOTE — Progress Notes (Addendum)
Progress Note  Patient Name: Katie Hampton Date of Encounter: 05/29/2022  Primary Cardiologist: New - consult by Marzetta Board   She remains in atrial flutter with variable AV block with ventricular rates in the 90s bpm currently, though has briefly been tachycardic. No chest pain, dyspnea, palpitations, dizziness, presyncope, syncope, flushing, or nausea.   Inpatient Medications    Scheduled Meds:  enoxaparin (LOVENOX) injection  0.5 mg/kg Subcutaneous Q24H   fenofibrate  160 mg Oral Daily   flecainide  75 mg Oral Q12H   gabapentin  600 mg Oral QHS   ibuprofen  400 mg Oral TID   insulin aspart  0-15 Units Subcutaneous TID WC   insulin aspart  0-5 Units Subcutaneous QHS   insulin aspart  6 Units Subcutaneous TID WC   insulin glargine-yfgn  22 Units Subcutaneous BID   metoprolol tartrate  25 mg Oral BID   sodium chloride flush  5 mL Intracatheter Q8H   Continuous Infusions:  sodium chloride Stopped (05/27/22 0722)   ceFEPime (MAXIPIME) IV 2 g (05/29/22 0554)   sodium chloride Stopped (05/24/22 1744)   PRN Meds: sodium chloride, acetaminophen, metoprolol tartrate, morphine injection, ondansetron (ZOFRAN) IV, mouth rinse, oxyCODONE-acetaminophen, SUMAtriptan   Vital Signs    Vitals:   05/29/22 0027 05/29/22 0443 05/29/22 0551 05/29/22 0855  BP: (!) 109/50 98/68  117/68  Pulse: 73 73  (!) 103  Resp: '16 16  18  '$ Temp: 97.9 F (36.6 C) (!) 97.4 F (36.3 C) 97.6 F (36.4 C) 98 F (36.7 C)  TempSrc:   Oral   SpO2: 95% 97%  98%  Weight:      Height:        Intake/Output Summary (Last 24 hours) at 05/29/2022 0948 Last data filed at 05/28/2022 2154 Gross per 24 hour  Intake 100 ml  Output --  Net 100 ml    Filed Weights   05/24/22 1255 05/28/22 1039  Weight: 100.7 kg 99.8 kg    Telemetry    Atrial flutter with variable AV block with ventricular rates in the 90s bpm with brief episode into the 140s bpm - Personally Reviewed  ECG    No new tracings -  Personally Reviewed  Physical Exam   GEN: No acute distress.   Neck: No JVD. Cardiac: Irregularly irregular, no murmurs, rubs, or gallops.  Respiratory: Clear to auscultation bilaterally.  GI: Soft, nontender, non-distended.   MS: No edema; No deformity. Neuro:  Alert and oriented x 3; Nonfocal.  Psych: Normal affect.  Labs    Chemistry Recent Labs  Lab 05/24/22 1306 05/25/22 0451 05/28/22 0854  NA 132* 131* 129*  K 3.9 4.0 5.1  CL 97* 100 92*  CO2 '23 22 24  '$ GLUCOSE 234* 343* 478*  BUN '7 6 11  '$ CREATININE 0.82 0.78 0.88  CALCIUM 8.5* 8.0* 8.6*  PROT 7.7  --   --   ALBUMIN 2.6*  --   --   AST 23  --   --   ALT 17  --   --   ALKPHOS 231*  --   --   BILITOT 0.5  --   --   GFRNONAA >60 >60 >60  ANIONGAP '12 9 13      '$ Hematology Recent Labs  Lab 05/24/22 1306 05/25/22 0451 05/28/22 0854  WBC 18.8* 15.2* 15.4*  RBC 3.70* 3.11* 3.40*  HGB 11.2* 9.2* 10.4*  HCT 35.3* 29.2* 31.4*  MCV 95.4 93.9 92.4  MCH 30.3 29.6 30.6  MCHC  31.7 31.5 33.1  RDW 13.2 13.2 13.2  PLT 590* 524* 692*     Cardiac EnzymesNo results for input(s): "TROPONINI" in the last 168 hours. No results for input(s): "TROPIPOC" in the last 168 hours.   BNPNo results for input(s): "BNP", "PROBNP" in the last 168 hours.   DDimer No results for input(s): "DDIMER" in the last 168 hours.   Radiology    CT GUIDED VISCERAL FLUID DRAIN BY PERC CATH  Result Date: 05/28/2022 IMPRESSION: Successful CT guided placement of 10 French mini loop Skater drain catheters into the interpolar and upper pole renal abscesses with aspiration of 10 mL of purulent fluid. Samples were sent to the laboratory as requested by the ordering clinical team. Ruthann Cancer, MD [facility] Inter[facility]iology Specialists [facility] Radiology Electronically Signed   By: Ruthann Cancer M.D.   On: 05/28/2022 13:02   CT GUIDED VISCERAL FLUID DRAIN BY PERC CATH  Result Date: 05/28/2022 IMPRESSION: Successful CT guided placement  of 10 French mini loop Skater drain catheters into the interpolar and upper pole renal abscesses with aspiration of 10 mL of purulent fluid. Samples were sent to the laboratory as requested by the ordering clinical team. Ruthann Cancer, MD V[facility] and Interventional Radiology Specialists [facility] Radiology Electronically Signed   By: Ruthann Cancer M.D.   On: 05/28/2022 13:02    Cardiac Studies   2D echo 05/26/2022: 1. Left ventricular ejection fraction, by estimation, is 60 to 65%. The  left ventricle has normal function. The left ventricle has no regional  wall motion abnormalities. Left ventricular diastolic parameters are  indeterminate.   2. Right ventricular systolic function is normal. The right ventricular  size is normal. There is normal pulmonary artery systolic pressure. The  estimated right ventricular systolic pressure is [id number] mmHg.   3. The mitral valve is normal in structure. No evidence of mitral valve  regurgitation. No evidence of mitral stenosis.   4. The aortic valve has an indeterminant number of cusps. Aortic valve  regurgitation is not visualized. No aortic stenosis is present.   5. The inferior vena cava is normal in size with greater than 50%  respiratory variability, suggesting right atrial pressure of 3 mmHg.   6. There is a very small patent foramen ovale with minimal left to right  shunting across the atrial septum.   Patient Profile     42 y.o. female with history of diabetes, pyelonephritis, recurrent UTIs, and nephrolithiasis who is being evaluated for new onset A-fib/flutter.  Assessment & Plan    1.  New onset A-fib/flutter: -Likely in the setting of her illness of sepsis secondary to renal abscess status post drain placement -Throughout the admission, she has had paroxysms of A-fib/flutter with subsequent spontaneous conversion to sinus rhythm -She has been evaluated by EP with recommendation to be maintained on flecainide and metoprolol -Titrate  metoprolol to 50 mg bid for added ventricular rate control (with hold parameters for systolic BP < 123XX123 mmHg or heart rate < 60 bpm) -Will give an additional 25 mg of metoprolol at noon today for a total of 50 mg for the morning dose  -Not currently on anticoagulation given low CHA2DS2-VASc -No current indication for DCCV given these episodes have been intermittent and are self terminating -Patient will need ZIO XT at discharge with EP follow-up thereafter -Potassium at goal, TSH normal     For questions or updates, please contact [facility] Please consult [email or web address] for contact info under Cardiology/STEMI.    Signed, Thurmond Butts  [place], [place] Pager: [phone] 05/29/2022, 9:48 AM  Patient seen and examined with the above-signed Advanced Practice Provider and/or Housestaff. I personally reviewed laboratory data, imaging studies and relevant notes. I independently examined the patient and formulated the important aspects of the plan. I have edited the note to reflect any of my changes or salient points. I have personally discussed the plan with the patient and/or family.  Underwent percutaneous drainage of renal abscess yesterday.   Has been in/out of AF/SR over past 24 hours. Rates 80-110. Remains on flecainide 75 bid.   ECHO EF 60-65% mild LAE. RV ok   Denies palpitations, CP or SOB  General: Sitting up  No resp difficulty HEENT: normal Neck: supple. no JVD. Carotids 2+ bilat; no bruits. No lymphadenopathy or thryomegaly appreciated. Cor: PMI nondisplaced. Irregular rate & rhythm. No rubs, gallops or murmurs. Lungs: clear Abdomen: obese soft, nontender, nondistended. No hepatosplenomegaly. No bruits or masses. Good bowel sounds. Extremities: no cyanosis, clubbing, rash, edema Neuro: alert & orientedx3, cranial nerves grossly intact. moves all 4 extremities w/o difficulty. Affect pleasant  Continues to go in/out AF. I spoke with Dr. Caryl Comes. Will increase flecainide  to 100 bid. Will need Zio at d/c. No AC given CHADsVASC 2 (with 1 being gender)   Suspect underlying OSA. Consider sleep study as outpatient.   Glori Bickers, MD  5:46 PM

## 2022-05-29 NOTE — Progress Notes (Addendum)
Progress Note   Patient: Katie Hampton [id number] DOB: 12/02/1980 DOA: 05/24/2022     5 DOS: the patient was seen and examined on 05/29/2022   Brief hospital course: Katie Hampton is a 42 y.o. female with medical history significant of pyelonephritis, HLD, diabetes mellitus, peripheral neuropathy, obesity, who presents with right flank pain, burning on urination.   Patient was diagnosed with UTI and started on Bactrim on 3/1.  She continues to have burning on urination, mild dysuria and increased urinary frequency.  She also noticed little blood in her urine occasionally. She developed right flank pain, which is constant, aching, moderate, nonradiating.     CT abd/pelvis Inflammatory changes involving the right kidney with poor enhancement and multiple fluid collections consistent with abscess formation and underlying pyelonephritis. Non-obstructing right-sided renal stone.   3/11--urology and cardiology consult 3/12--ID consult, s/p Right renal abscess drain placement. 3/13 remains in atrial flutter, on metoprolol and flecainide.  Sugars running high so adjusted insulin  Assessment and Plan:  Severe sepsis due to renal abscess, right: Present on admission.  criteria for severe sepsis with WBC 18.8, heart rate of 112, lactic acid 3.1.   -- consulted Dr. Diamantina Providence of urology, who recommended IR consultation for possible drainage.   --Since pt is hemodynamically stable, Dr. Serafina Royals recommended continuation of IV antibiotics. If clinical course worsens or she fails to improve, call back to IR for consideration of image guide drain.  -IV cefepime changed to IV meropenem per ID based on history of ESBL in 2022 -Follow-up of blood culture  negative and urine culture <10K insignificant growth --Tolerating po diet --remains afebrile --WBC 18K--15K --3/12--s/p Right renal abscess drain placement. F/u pending cultures. Will need repeat imaging to look for resolution --ID Dr Delaine Lame  following   Diabetes mellitus (type 1 diabetes per patient, with peripheral neuropathy)uncontrolled:  -Patient is taking Novolin and NPH insulin 20-22 units in AM and 18-20 units in PM -SSI --d/w DM coodinator--will do Semglee  25 units bid with SSI and adjust according to sugars.  Add insulin NovoLog 10 unit 3 times daily --A1c7.4%   Peripheral neuropathy -Neurontin   HLD (hyperlipidemia) -Fenofibrate   New onset atrial fibrillation/flutter ([place]): EKG seems to have new onset A flutter/A-fib with variable AV block.  Heart rate 100-110.  CHADS2 score is  2(DM and female). Likely triggered by severe sepsis. -Consulted Dr. Rockey Situ for cardiology -pt goes in afib intermittently. Started on Flecainde by Dr Caryl Comes. No AC at present. Will need Zio monitor at d/c --cont BB + Flecanide   Obesity (BMI 30-39.9): Body weight 100.7 kg, BMI 39.33 -Healthy diet, and exercise -Encourage losing weight      Subjective: No new complaints.  Sugar running high  Physical Exam: Vitals:   05/29/22 0443 05/29/22 0551 05/29/22 0855 05/29/22 1200  BP: 98/68  117/68 127/79  Pulse: 73  (!) 103 78  Resp: 16  18   Temp: (!) 97.4 F (36.3 C) 97.6 F (36.4 C) 98 F (36.7 C)   TempSrc:  Oral    SpO2: 97%  98%   Weight:      Height:       GENERAL:  42 y.o.-year-old patient with no acute distress. Obese LUNGS: Normal breath sounds bilaterally, no wheezing CARDIOVASCULAR: S1, S2 normal. No murmur  intermittent tachycardia ABDOMEN: Soft, nontender, nondistended. Bowel sounds present. Right flank drain + EXTREMITIES: No  edema b/l.    NEUROLOGIC: nonfocal  patient is alert and awake SKIN: No obvious rash, lesion, or ulcer.  Data Reviewed:  Blood sugar 300-450  Family Communication: None at bedside  Disposition: Status is: Inpatient Remains inpatient appropriate because: Management of sepsis  Planned Discharge Destination: Home   DVT prophylaxis-SCDs Time spent: 35 minutes  Author: Max Sane, MD 05/29/2022 3:16 PM  For on call review [email or web address].

## 2022-05-29 NOTE — Progress Notes (Addendum)
CCMD called and reported a paused of 2.27 sec and HR down to 42. Pt is stable on assessment and vitals stable. NP Foust made aware. Will continue to monitor.  Update 0046: No New order. Will continue to monitor.  Update 0057: CCMD called for a paused of 2.03 sec. Pt has no complaints at this time. NP Foust made aware. Will continue to monitor.

## 2022-05-30 ENCOUNTER — Inpatient Hospital Stay: Payer: Medicare Other

## 2022-05-30 DIAGNOSIS — E1040 Type 1 diabetes mellitus with diabetic neuropathy, unspecified: Secondary | ICD-10-CM | POA: Diagnosis not present

## 2022-05-30 DIAGNOSIS — N10 Acute tubulo-interstitial nephritis: Secondary | ICD-10-CM

## 2022-05-30 DIAGNOSIS — I4819 Other persistent atrial fibrillation: Secondary | ICD-10-CM

## 2022-05-30 DIAGNOSIS — I480 Paroxysmal atrial fibrillation: Secondary | ICD-10-CM | POA: Diagnosis not present

## 2022-05-30 DIAGNOSIS — N151 Renal and perinephric abscess: Secondary | ICD-10-CM | POA: Diagnosis not present

## 2022-05-30 LAB — CBC
HCT: 31.9 % — ABNORMAL LOW (ref 36.0–46.0)
Hemoglobin: 10.4 g/dL — ABNORMAL LOW (ref 12.0–15.0)
MCH: 30.3 pg (ref 26.0–34.0)
MCHC: 32.6 g/dL (ref 30.0–36.0)
MCV: 93 fL (ref 80.0–100.0)
Platelets: 694 10*3/uL — ABNORMAL HIGH (ref 150–400)
RBC: 3.43 MIL/uL — ABNORMAL LOW (ref 3.87–5.11)
RDW: 13.1 % (ref 11.5–15.5)
WBC: 10 10*3/uL (ref 4.0–10.5)
nRBC: 0 % (ref 0.0–0.2)

## 2022-05-30 LAB — BASIC METABOLIC PANEL
Anion gap: 7 (ref 5–15)
BUN: 9 mg/dL (ref 6–20)
CO2: 28 mmol/L (ref 22–32)
Calcium: 8.7 mg/dL — ABNORMAL LOW (ref 8.9–10.3)
Chloride: 97 mmol/L — ABNORMAL LOW (ref 98–111)
Creatinine, Ser: 0.73 mg/dL (ref 0.44–1.00)
GFR, Estimated: >60 mL/min (ref >60)
Glucose, Bld: 215 mg/dL — ABNORMAL HIGH (ref 70–99)
Potassium: 3.7 mmol/L (ref 3.5–5.1)
Sodium: 132 mmol/L — ABNORMAL LOW (ref 135–145)

## 2022-05-30 LAB — GLUCOSE, CAPILLARY
Glucose-Capillary: 247 mg/dL — ABNORMAL HIGH (ref 70–99)
Glucose-Capillary: 190 mg/dL — ABNORMAL HIGH (ref 70–99)
Glucose-Capillary: 54 mg/dL — ABNORMAL LOW (ref 70–99)
Glucose-Capillary: 86 mg/dL (ref 70–99)
Glucose-Capillary: 205 mg/dL — ABNORMAL HIGH (ref 70–99)

## 2022-05-30 MED ORDER — OXYCODONE-ACETAMINOPHEN 5-325 MG PO TABS
1.0000 | ORAL_TABLET | Freq: Three times a day (TID) | ORAL | Status: DC | PRN
  Administered 2022-05-30 – 2022-05-31 (×2): 1 via ORAL
  Filled 2022-05-30 (×2): qty 1

## 2022-05-30 MED ORDER — IOHEXOL 9 MG/ML PO SOLN: 500.0000 mL | Freq: Once | ORAL | Status: DC | PRN

## 2022-05-30 NOTE — Progress Notes (Signed)
Patient with history of acute right pyelonephritis complicated by abscess  S/p right renal abscess 37F drain placement x 2 (interpolar and upper pole) with aspiration of 59m by IR 3/12, Cx + Klebsiella pneumoniae, wbc improved now wnl, afebrile and minimal drain output.  Output by Drain (mL) 05/28/22 0701 - 05/28/22 1900 05/28/22 1901 - 05/29/22 0700 05/29/22 0701 - 05/29/22 1900 05/29/22 1901 - 05/30/22 0700 05/30/22 0701 - 05/30/22 1225  Closed System Drain 1 Inferior;Right Back    7   Closed System Drain 1 Inferior;Lateral;Right Back    10    Discussed case and imaging with my attending, Dr. MMaryelizabeth Kaufmann Recommend to continue following drain output and when output decreases even more would repeat CT abdomen and pelvis with IV contrast.  MHedy Jacob PA-C 05/30/2022, 12:27 PM

## 2022-05-30 NOTE — Care Management Important Message (Signed)
Important Message  Patient Details  Name: Katie Hampton MRN: [id number] Date of Birth: 1981-02-09   Medicare Important Message Given:  Yes     Dannette Barbara 05/30/2022, 2:01 PM

## 2022-05-30 NOTE — Progress Notes (Signed)
Rounding Note    Patient Name: Katie Hampton Date of Encounter: 05/30/2022  [place] Cardiologist:New  Subjective   Patient converted to NSR this morning. She is overall feeling well. No chest pain or SOB.   Inpatient Medications    Scheduled Meds:  enoxaparin (LOVENOX) injection  0.5 mg/kg Subcutaneous Q24H   fenofibrate  160 mg Oral Daily   flecainide  100 mg Oral Q12H   gabapentin  600 mg Oral QHS   insulin aspart  0-15 Units Subcutaneous TID WC   insulin aspart  0-5 Units Subcutaneous QHS   insulin aspart  10 Units Subcutaneous TID WC   insulin glargine-yfgn  25 Units Subcutaneous BID   metoprolol tartrate  50 mg Oral BID   sodium chloride flush  5 mL Intracatheter Q8H   Continuous Infusions:  sodium chloride Stopped (05/27/22 0722)   meropenem (MERREM) IV Stopped (05/30/22 [id number])   sodium chloride Stopped (05/24/22 1744)   PRN Meds: sodium chloride, acetaminophen, metoprolol tartrate, ondansetron (ZOFRAN) IV, mouth rinse, oxyCODONE-acetaminophen, SUMAtriptan   Vital Signs    Vitals:   05/29/22 1806 05/29/22 2046 05/30/22 0530 05/30/22 0757  BP: 124/67 (!) 123/57 128/68 (!) 110/58  Pulse:  95 92 78  Resp:  '18 18 18  '$ Temp:  98 F (36.7 C) 98.4 F (36.9 C) 98.7 F (37.1 C)  TempSrc:  Oral Oral Oral  SpO2:  98% 100% 97%  Weight:      Height:        Intake/Output Summary (Last 24 hours) at 05/30/2022 1312 Last data filed at 05/30/2022 1015 Gross per 24 hour  Intake 1310 ml  Output 17 ml  Net 1293 ml      05/28/2022   10:39 AM 05/24/2022   12:55 PM 05/17/2022    5:33 PM  Last 3 Weights  Weight (lbs) 220 lb 222 lb 222 lb  Weight (kg) 99.791 kg 100.699 kg 100.699 kg      Telemetry    Fib>NSR - Personally Reviewed  ECG    No new - Personally Reviewed  Physical Exam   GEN: No acute distress.   Neck: No JVD Cardiac: RRR, no murmurs, rubs, or gallops.  Respiratory: Clear to auscultation bilaterally. GI: Soft, nontender,  non-distended  MS: No edema; No deformity. Neuro:  Nonfocal  Psych: Normal affect   Labs    High Sensitivity Troponin:   Recent Labs  Lab 05/24/22 1924 05/24/22 2128  TROPONINIHS 5 5     Chemistry Recent Labs  Lab 05/24/22 1306 05/25/22 0451 05/28/22 0854 05/30/22 0523  NA 132* 131* 129* 132*  K 3.9 4.0 5.1 3.7  CL 97* 100 92* 97*  CO2 '23 22 24 28  '$ GLUCOSE 234* 343* 478* 215*  BUN '7 6 11 9  '$ CREATININE 0.82 0.78 0.88 0.73  CALCIUM 8.5* 8.0* 8.6* 8.7*  PROT 7.7  --   --   --   ALBUMIN 2.6*  --   --   --   AST 23  --   --   --   ALT 17  --   --   --   ALKPHOS 231*  --   --   --   BILITOT 0.5  --   --   --   GFRNONAA >60 >60 >60 >60  ANIONGAP '12 9 13 7    '$ Lipids No results for input(s): "CHOL", "TRIG", "HDL", "LABVLDL", "LDLCALC", "CHOLHDL" in the last 168 hours.  Hematology Recent Labs  Lab 05/25/22 0451 05/28/22  [id number] 05/30/22 0523  WBC 15.2* 15.4* 10.0  RBC 3.11* 3.40* 3.43*  HGB 9.2* 10.4* 10.4*  HCT 29.2* 31.4* 31.9*  MCV 93.9 92.4 93.0  MCH 29.6 30.6 30.3  MCHC 31.5 33.1 32.6  RDW 13.2 13.2 13.1  PLT 524* 692* 694*   Thyroid  Recent Labs  Lab 05/24/22 1306  TSH 1.001    BNPNo results for input(s): "BNP", "PROBNP" in the last 168 hours.  DDimer No results for input(s): "DDIMER" in the last 168 hours.   Radiology    US RENAL  Result Date: 05/30/2022 CLINICAL DATA:  Pain. UTI. Status post percutaneous drain placement for right renal abscess. Right renal abscess not well demonstrated EXAM: RENAL / URINARY TRACT ULTRASOUND COMPLETE COMPARISON:  CT scan 05/24/2022 FINDINGS: Right Kidney: Renal measurements: 11.1 x 5.0 x 5.2 cm = volume: 163 mL. Echogenicity within normal limits. No mass or hydronephrosis visualized. Left Kidney: Renal measurements: 12.2 x 6.4 x 5.3 cm = volume: 215 mL. Echogenicity within normal limits. No mass or hydronephrosis visualized. Bladder: Appears normal for degree of bladder distention. Other: None. IMPRESSION: Known right  renal abscess not well demonstrated by ultrasound today. No hydronephrosis in either kidney. Consider follow-up CT imaging with intravenous contrast for more definitive assessment. Electronically Signed   By: Misty Stanley M.D.   On: 05/30/2022 12:54    Cardiac Studies   2D echo 05/26/2022: 1. Left ventricular ejection fraction, by estimation, is 60 to 65%. The  left ventricle has normal function. The left ventricle has no regional  wall motion abnormalities. Left ventricular diastolic parameters are  indeterminate.   2. Right ventricular systolic function is normal. The right ventricular  size is normal. There is normal pulmonary artery systolic pressure. The  estimated right ventricular systolic pressure is [id number] mmHg.   3. The mitral valve is normal in structure. No evidence of mitral valve  regurgitation. No evidence of mitral stenosis.   4. The aortic valve has an indeterminant number of cusps. Aortic valve  regurgitation is not visualized. No aortic stenosis is present.   5. The inferior vena cava is normal in size with greater than 50%  respiratory variability, suggesting right atrial pressure of 3 mmHg.   6. There is a very small patent foramen ovale with minimal left to right  shunting across the atrial septum.     Patient Profile     42 y.o. female with history of diabetes, pyelonephritis, recurrent UTIs, and nephrolithiasis who is being evaluated for new onset A-fib/flutter   Assessment & Plan    New onset Afib/flutter - presented in the setting of severe sepsis due to right renal abscess - converted to NSR this AM - CHADSVASC of 2 (female and DM2) - flecainide started by EP - metoprolol '50mg'$  BID and flecainide '100mg'$  BID - TSH wnl - Keep Mag>2 and K>4 - patient will need heart monitor at discharge. Continue telemetry during admission  For questions or updates, please contact [place] Please consult [email or web address] for contact info under         Signed, Ivanell Deshotel Ninfa Meeker, PA-C  05/30/2022, 1:12 PM

## 2022-05-30 NOTE — Progress Notes (Signed)
Date of Admission:  05/24/2022      ID: Katie Hampton is a 42 y.o. female Principal Problem:   Abscess of right kidney Active Problems:   Acute sepsis ([place])   Diabetes mellitus ([place])   Peripheral neuropathy   Paroxysmal atrial fibrillation ([facility])   HLD (hyperlipidemia)   Obesity (BMI 30-39.9)   Atrial flutter ([facility])   Acute pyelonephritis   Persistent atrial fibrillation ([facility])    Subjective: Feeling better Pain rt flank better  Medications:   enoxaparin (LOVENOX) injection  0.5 mg/kg Subcutaneous Q24H   fenofibrate  160 mg Oral Daily   flecainide  100 mg Oral Q12H   gabapentin  600 mg Oral QHS   insulin aspart  0-15 Units Subcutaneous TID WC   insulin aspart  0-5 Units Subcutaneous QHS   insulin aspart  10 Units Subcutaneous TID WC   insulin glargine-yfgn  25 Units Subcutaneous BID   metoprolol tartrate  50 mg Oral BID   sodium chloride flush  5 mL Intracatheter Q8H    Objective: Vital signs in last 24 hours: Patient Vitals for the past 24 hrs:  BP Temp Temp src Pulse Resp SpO2  05/30/22 0757 (!) 110/58 98.7 F (37.1 C) Oral 78 18 97 %  05/30/22 0530 128/68 98.4 F (36.9 C) Oral 92 18 100 %  05/29/22 2046 (!) 123/57 98 F (36.7 C) Oral 95 18 98 %  05/29/22 1806 124/67 -- -- -- -- --  05/29/22 1531 127/80 97.9 F (36.6 C) Oral 76 18 98 %      PHYSICAL EXAM:  General: Alert, cooperative, no distress, appears stated age.  Lungs: b/l air entry- decreased base Abdomen: Soft, rt flank 2 drains present Extremities: atraumatic, no cyanosis. No edema. No clubbing Skin: No rashes or lesions. Or bruising Lymph: Cervical, supraclavicular normal. Neurologic: Grossly non-focal  Labs    Latest Ref Rng & Units 05/30/2022    5:23 AM 05/28/2022    8:54 AM 05/25/2022    4:51 AM  CBC  WBC 4.0 - 10.5 K/uL 10.0  15.4  15.2   Hemoglobin 12.0 - 15.0 g/dL 10.4  10.4  9.2   Hematocrit 36.0 - 46.0 % 31.9  31.4  29.2   Platelets 150 - 400 K/uL 694  692  524        Latest  Ref Rng & Units 05/30/2022    5:23 AM 05/28/2022    8:54 AM 05/25/2022    4:51 AM  CMP  Glucose 70 - 99 mg/dL 215  478  343   BUN 6 - 20 mg/dL '9  11  6   '$ Creatinine 0.44 - 1.00 mg/dL 0.73  0.88  0.78   Sodium 135 - 145 mmol/L 132  129  131   Potassium 3.5 - 5.1 mmol/L 3.7  5.1  4.0   Chloride 98 - 111 mmol/L 97  92  100   CO2 22 - 32 mmol/L '28  24  22   '$ Calcium 8.9 - 10.3 mg/dL 8.7  8.6  8.0     Microbiology: Renal abscess fluid is klebsiella- culture pending Studies/Results: US RENAL  Result Date: 05/30/2022 CLINICAL DATA:  Pain. UTI. Status post percutaneous drain placement for right renal abscess. Right renal abscess not well demonstrated EXAM: RENAL / URINARY TRACT ULTRASOUND COMPLETE COMPARISON:  CT scan 05/24/2022 FINDINGS: Right Kidney: Renal measurements: 11.1 x 5.0 x 5.2 cm = volume: 163 mL. Echogenicity within normal limits. No mass or hydronephrosis visualized. Left Kidney: Renal measurements:  12.2 x 6.4 x 5.3 cm = volume: 215 mL. Echogenicity within normal limits. No mass or hydronephrosis visualized. Bladder: Appears normal for degree of bladder distention. Other: None. IMPRESSION: Known right renal abscess not well demonstrated by ultrasound today. No hydronephrosis in either kidney. Consider follow-up CT imaging with intravenous contrast for more definitive assessment. Electronically Signed   By: Misty Stanley M.D.   On: 05/30/2022 12:54     Assessment/Plan: Complicated urinary tract infection with multiple right-sided renal abscesses, and large kidney and perinephric stranding. Patient is currently on meropenem In February  the urine culture at [facility] showed Klebsiella pneumonia which was pretty sensitive ( R to nitrofurantoin /ampicillin).  She was on ciprofloxacin and then Bactrim but had not responded to treatment. She has 2 drains.  US done today is not a good study will not do CT now- . Await susceptibility of klebsiella to decide on discharge antibiotic and  route- will need atleast 4 weeks of antibiotic  Diabetes mellitus.  Insulin-dependent. 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  Glaucoma.  Has a stent  Discussed the management with the patient and hospitalist

## 2022-05-30 NOTE — TOC Progression Note (Signed)
Transition of Care [facility]) - Progression Note    Patient Details  Name: Katie Hampton MRN: [id number] Date of Birth: [birth year]  [facility] CM/SW Contact  Beverly Sessions, RN Phone Number: 05/30/2022, 10:56 AM  Clinical Narrative:     Please consult TOC if IV antibiotics needed at discharge        Expected Discharge Plan and Services                                               Social Determinants of Health (SDOH) Interventions SDOH Screenings   Food Insecurity: No Food Insecurity (05/24/2022)  Housing: Low Risk  (05/24/2022)  Transportation Needs: No Transportation Needs (05/24/2022)  Utilities: Not At Risk (05/24/2022)  Tobacco Use: Medium Risk (05/24/2022)    Readmission Risk Interventions     No data to display

## 2022-05-30 NOTE — Plan of Care (Signed)

## 2022-05-30 NOTE — Progress Notes (Signed)
Progress Note   Patient: Katie Hampton [id number] DOB: April 26, 1980 DOA: 05/24/2022     6 DOS: the patient was seen and examined on 05/30/2022   Brief hospital course: Arnie Cotterman is a 42 y.o. female with medical history significant of pyelonephritis, HLD, diabetes mellitus, peripheral neuropathy, obesity, who presents with right flank pain, burning on urination.   Patient was diagnosed with UTI and started on Bactrim on 3/1.  She continues to have burning on urination, mild dysuria and increased urinary frequency.  She also noticed little blood in her urine occasionally. She developed right flank pain, which is constant, aching, moderate, nonradiating.     CT abd/pelvis Inflammatory changes involving the right kidney with poor enhancement and multiple fluid collections consistent with abscess formation and underlying pyelonephritis. Non-obstructing right-sided renal stone.   3/11--urology and cardiology consult 3/12--ID consult, s/p Right renal abscess drain placement. 3/13 remains in atrial flutter, on metoprolol and flecainide.  Sugars running high so adjusted insulin 3/14: Checking renal ultrasound to evaluate her abscess  Assessment and Plan: Severe sepsis due to renal abscess, right: Present on admission.  criteria for severe sepsis with WBC 18.8, heart rate of 112, lactic acid 3.1.   -- consulted Dr. Diamantina Providence of urology, who recommended IR consultation for possible drainage.   --Since pt is hemodynamically stable, Dr. Serafina Royals recommended continuation of IV antibiotics. If clinical course worsens or she fails to improve, call back to IR for consideration of image guide drain.  -IV cefepime changed to IV meropenem per ID based on history of ESBL in 2022 -Follow-up of blood culture  negative and urine culture <10K insignificant growth --Tolerating po diet --remains afebrile --WBC 18K--15K-10K --3/12--s/p Right renal abscess drain placement. F/u pending cultures.  Repeating  renal ultrasound today to evaluate for abscess drainage.  IR recommends to keep the drain until output decreases less than 10 mL a day.  May need repeat CT abdomen and pelvis with IV contrast for further evaluation --ID Dr Delaine Lame following -Cutting back on narcotic pain medication possible discharge soon   Diabetes mellitus (type 1 diabetes per patient, with peripheral neuropathy)uncontrolled:  -Patient is taking Novolin and NPH insulin 20-22 units in AM and 18-20 units in PM -SSI --d/w DM coodinator--continue insulin Semglee  25 units bid with SSI and adjust according to sugars, insulin NovoLog 10 unit 3 times daily --A1c7.4%   Peripheral neuropathy -Neurontin   HLD (hyperlipidemia) -Fenofibrate   New onset atrial fibrillation/flutter ([place]): EKG seems to have new onset A flutter/A-fib with variable AV block.  Heart rate 100-110.  CHADS2 score is  2(DM and female). Likely triggered by severe sepsis. -Consulted Dr. Rockey Situ for cardiology -pt goes in afib intermittently. Started on Flecainde by Dr Caryl Comes. No AC at present. Will need Zio monitor at d/c --cont BB + Flecanide   Obesity (BMI 30-39.9): Body weight 100.7 kg, BMI 39.33 -Healthy diet, and exercise -Encourage losing weight      Subjective: Sitting in the chair.  Feeling much better.  Hopeful for discharge soon  Physical Exam: Vitals:   05/29/22 1806 05/29/22 2046 05/30/22 0530 05/30/22 0757  BP: 124/67 (!) 123/57 128/68 (!) 110/58  Pulse:  95 92 78  Resp:  '18 18 18  '$ Temp:  98 F (36.7 C) 98.4 F (36.9 C) 98.7 F (37.1 C)  TempSrc:  Oral Oral Oral  SpO2:  98% 100% 97%  Weight:      Height:        GENERAL:  42 y.o.-year-old patient with no  acute distress. Obese LUNGS: Normal breath sounds bilaterally, no wheezing CARDIOVASCULAR: S1, S2 normal. No murmur  intermittent tachycardia ABDOMEN: Soft, nontender, nondistended. Bowel sounds present. Right flank drain + EXTREMITIES: No  edema b/l.    NEUROLOGIC:  nonfocal  patient is alert and awake SKIN: No obvious rash, lesion, or ulcer.  Data Reviewed:  Renal ultrasound showing no hydronephrosis.  Renal abscesses are not well visualized on this image  Family Communication: None at bedside  Disposition: Status is: Inpatient Remains inpatient appropriate because: Management of renal abscesses  Planned Discharge Destination: Home   DVT prophylaxis-Lovenox Time spent: 35 minutes  Author: Max Sane, MD 05/30/2022 12:49 PM  For on call review [email or web address].

## 2022-05-31 ENCOUNTER — Ambulatory Visit: Payer: Medicare Other | Attending: Medical

## 2022-05-31 ENCOUNTER — Other Ambulatory Visit: Payer: Self-pay

## 2022-05-31 DIAGNOSIS — I483 Typical atrial flutter: Secondary | ICD-10-CM

## 2022-05-31 DIAGNOSIS — I480 Paroxysmal atrial fibrillation: Secondary | ICD-10-CM

## 2022-05-31 DIAGNOSIS — N151 Renal and perinephric abscess: Secondary | ICD-10-CM | POA: Diagnosis not present

## 2022-05-31 DIAGNOSIS — N10 Acute tubulo-interstitial nephritis: Secondary | ICD-10-CM

## 2022-05-31 LAB — CBC
HCT: 33.3 % — ABNORMAL LOW (ref 36.0–46.0)
Hemoglobin: 10.6 g/dL — ABNORMAL LOW (ref 12.0–15.0)
MCH: 29.9 pg (ref 26.0–34.0)
MCHC: 31.8 g/dL (ref 30.0–36.0)
MCV: 93.8 fL (ref 80.0–100.0)
Platelets: 709 10*3/uL — ABNORMAL HIGH (ref 150–400)
RBC: 3.55 MIL/uL — ABNORMAL LOW (ref 3.87–5.11)
RDW: 13.2 % (ref 11.5–15.5)
WBC: 10.1 10*3/uL (ref 4.0–10.5)
nRBC: 0 % (ref 0.0–0.2)

## 2022-05-31 LAB — BASIC METABOLIC PANEL
Anion gap: 9 (ref 5–15)
BUN: 8 mg/dL (ref 6–20)
CO2: 30 mmol/L (ref 22–32)
Calcium: 8.9 mg/dL (ref 8.9–10.3)
Chloride: 97 mmol/L — ABNORMAL LOW (ref 98–111)
Creatinine, Ser: 0.8 mg/dL (ref 0.44–1.00)
GFR, Estimated: >60 mL/min (ref >60)
Glucose, Bld: 155 mg/dL — ABNORMAL HIGH (ref 70–99)
Potassium: 3.7 mmol/L (ref 3.5–5.1)
Sodium: 136 mmol/L (ref 135–145)

## 2022-05-31 LAB — GLUCOSE, CAPILLARY
Glucose-Capillary: 184 mg/dL — ABNORMAL HIGH (ref 70–99)
Glucose-Capillary: 121 mg/dL — ABNORMAL HIGH (ref 70–99)

## 2022-05-31 MED ORDER — FLECAINIDE ACETATE 100 MG PO TABS: 100.0000 mg | ORAL_TABLET | Freq: Two times a day (BID) | ORAL | 0 refills | Status: DC

## 2022-05-31 MED ORDER — LEVOFLOXACIN 750 MG PO TABS: 750.0000 mg | ORAL_TABLET | Freq: Every day | ORAL | 0 refills | Status: AC

## 2022-05-31 MED ORDER — METOPROLOL TARTRATE 50 MG PO TABS: 50.0000 mg | ORAL_TABLET | Freq: Two times a day (BID) | ORAL | 0 refills | Status: DC

## 2022-05-31 NOTE — Progress Notes (Signed)
Date of Admission:  05/24/2022      ID: Katie Hampton is a 42 y.o. female Principal Problem:   Abscess of right kidney Active Problems:   Acute sepsis ([place])   Diabetes mellitus ([place])   Peripheral neuropathy   Paroxysmal atrial fibrillation ([facility])   HLD (hyperlipidemia)   Obesity (BMI 30-39.9)   Atrial flutter (HCC)   Acute pyelonephritis   Persistent atrial fibrillation ([facility])    Subjective: Much better  Medications:   enoxaparin (LOVENOX) injection  0.5 mg/kg Subcutaneous Q24H   fenofibrate  160 mg Oral Daily   flecainide  100 mg Oral Q12H   gabapentin  600 mg Oral QHS   insulin aspart  0-15 Units Subcutaneous TID WC   insulin aspart  0-5 Units Subcutaneous QHS   insulin aspart  10 Units Subcutaneous TID WC   insulin glargine-yfgn  25 Units Subcutaneous BID   metoprolol tartrate  50 mg Oral BID   sodium chloride flush  5 mL Intracatheter Q8H    Objective: Vital signs in last 24 hours: Patient Vitals for the past 24 hrs:  BP Temp Temp src Pulse Resp SpO2  05/31/22 0718 (!) 141/62 98.7 F (37.1 C) Oral 65 16 96 %  05/31/22 0608 (!) 92/59 98.4 F (36.9 C) Oral 73 20 96 %  05/30/22 2148 -- -- -- 72 -- --  05/30/22 2039 109/67 98.3 F (36.8 C) Oral 64 20 98 %  05/30/22 1929 (!) 98/54 98.4 F (36.9 C) -- 89 20 100 %  05/30/22 1521 101/63 98 F (36.7 C) Oral 77 18 98 %      PHYSICAL EXAM:  General: Alert, cooperative, no distress, appears stated age.  Lungs: b/l air entry- decreased base Abdomen: Soft, rt flank 2 drains present Extremities: atraumatic, no cyanosis. No edema. No clubbing Skin: No rashes or lesions. Or bruising Lymph: Cervical, supraclavicular normal. Neurologic: Grossly non-focal  Labs    Latest Ref Rng & Units 05/31/2022    4:09 AM 05/30/2022    5:23 AM 05/28/2022    8:54 AM  CBC  WBC 4.0 - 10.5 K/uL 10.1  10.0  15.4   Hemoglobin 12.0 - 15.0 g/dL 10.6  10.4  10.4   Hematocrit 36.0 - 46.0 % 33.3  31.9  31.4   Platelets 150 - 400 K/uL  709  694  692        Latest Ref Rng & Units 05/31/2022    4:09 AM 05/30/2022    5:23 AM 05/28/2022    8:54 AM  CMP  Glucose 70 - 99 mg/dL 155  215  478   BUN 6 - 20 mg/dL 8  9  11    Creatinine 0.44 - 1.00 mg/dL 0.80  0.73  0.88   Sodium 135 - 145 mmol/L 136  132  129   Potassium 3.5 - 5.1 mmol/L 3.7  3.7  5.1   Chloride 98 - 111 mmol/L 97  97  92   CO2 22 - 32 mmol/L 30  28  24    Calcium 8.9 - 10.3 mg/dL 8.9  8.7  8.6     Microbiology: Renal abscess fluid is klebsiella- culture pending Studies/Results: US RENAL  Result Date: 05/30/2022 CLINICAL DATA:  Pain. UTI. Status post percutaneous drain placement for right renal abscess. Right renal abscess not well demonstrated EXAM: RENAL / URINARY TRACT ULTRASOUND COMPLETE COMPARISON:  CT scan 05/24/2022 FINDINGS: Right Kidney: Renal measurements: 11.1 x 5.0 x 5.2 cm = volume: 163 mL. Echogenicity within normal  limits. No mass or hydronephrosis visualized. Left Kidney: Renal measurements: 12.2 x 6.4 x 5.3 cm = volume: 215 mL. Echogenicity within normal limits. No mass or hydronephrosis visualized. Bladder: Appears normal for degree of bladder distention. Other: None. IMPRESSION: Known right renal abscess not well demonstrated by ultrasound today. No hydronephrosis in either kidney. Consider follow-up CT imaging with intravenous contrast for more definitive assessment. Electronically Signed   By: Misty Stanley M.D.   On: 05/30/2022 12:54     Assessment/Plan: Complicated urinary tract infection with multiple right-sided renal abscesses, and large kidney and perinephric stranding. Patient is currently on meropenem In February  the urine culture at [facility] showed Klebsiella pneumonia which was pretty sensitive ( R to nitrofurantoin /ampicillin).  She was on ciprofloxacin and then Bactrim but had not responded to treatment due to renal abscesses She has 2 drains.   . Klebsiella highly sensitive-will go home on levaquin 750mg - discussed the side  effects of quinolone 2-4 weeks Diabetes mellitus.  Insulin-dependent. 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  Glaucoma.  Has a stent  Discussed the management with the patient and hospitalist Will follow her as OP

## 2022-05-31 NOTE — Progress Notes (Signed)
Pt discharged per MD order. IV removed. Discharge instructions reviewed with pt and her husband. Drain care demonstrated by RN and supplies given. Pt and husband verbalized understanding with all questions answered to satisfaction. Pt taken out in wheelchair by volunteer.

## 2022-05-31 NOTE — Progress Notes (Signed)
Rounding Note    Patient Name: Jemiah Marzullo Date of Encounter: 05/31/2022  [facility] Cardiologist:New  Subjective   Tele shows NSR with paroxysmal Afib/flutter. The patient overall is feeling well, occasional palpitations.  Inpatient Medications    Scheduled Meds:  enoxaparin (LOVENOX) injection  0.5 mg/kg Subcutaneous Q24H   fenofibrate  160 mg Oral Daily   flecainide  100 mg Oral Q12H   gabapentin  600 mg Oral QHS   insulin aspart  0-15 Units Subcutaneous TID WC   insulin aspart  0-5 Units Subcutaneous QHS   insulin aspart  10 Units Subcutaneous TID WC   insulin glargine-yfgn  25 Units Subcutaneous BID   metoprolol tartrate  50 mg Oral BID   sodium chloride flush  5 mL Intracatheter Q8H   Continuous Infusions:  sodium chloride Stopped (05/27/22 0722)   meropenem (MERREM) IV Stopped (05/31/22 [id number])   sodium chloride Stopped (05/24/22 1744)   PRN Meds: sodium chloride, acetaminophen, iohexol, metoprolol tartrate, ondansetron (ZOFRAN) IV, mouth rinse, oxyCODONE-acetaminophen, SUMAtriptan   Vital Signs    Vitals:   05/30/22 2039 05/30/22 2148 05/31/22 0608 05/31/22 0718  BP: 109/67  (!) 92/59 (!) 141/62  Pulse: 64 72 73 65  Resp: 20  20 16   Temp: 98.3 F (36.8 C)  98.4 F (36.9 C) 98.7 F (37.1 C)  TempSrc: Oral  Oral Oral  SpO2: 98%  96% 96%  Weight:      Height:        Intake/Output Summary (Last 24 hours) at 05/31/2022 1044 Last data filed at 05/31/2022 [id number] Gross per 24 hour  Intake 793.66 ml  Output 20 ml  Net 773.66 ml      05/28/2022   10:39 AM 05/24/2022   12:55 PM 05/17/2022    5:33 PM  Last 3 Weights  Weight (lbs) 220 lb 222 lb 222 lb  Weight (kg) 99.791 kg 100.699 kg 100.699 kg      Telemetry    NSR, afib/flutter with slow ventricular rates overnight and rates up to 100 - Personally Reviewed  ECG    No new - Personally Reviewed  Physical Exam   GEN: No acute distress.   Neck: No JVD Cardiac: RRR, no murmurs, rubs,  or gallops.  Respiratory: Clear to auscultation bilaterally. GI: Soft, nontender, non-distended  MS: No edema; No deformity. Neuro:  Nonfocal  Psych: Normal affect   Labs    High Sensitivity Troponin:   Recent Labs  Lab 05/24/22 1924 05/24/22 2128  TROPONINIHS 5 5     Chemistry Recent Labs  Lab 05/24/22 1306 05/25/22 0451 05/28/22 0854 05/30/22 0523 05/31/22 0409  NA 132*   < > 129* 132* 136  K 3.9   < > 5.1 3.7 3.7  CL 97*   < > 92* 97* 97*  CO2 23   < > 24 28 30   GLUCOSE 234*   < > 478* 215* 155*  BUN 7   < > 11 9 8   CREATININE 0.82   < > 0.88 0.73 0.80  CALCIUM 8.5*   < > 8.6* 8.7* 8.9  PROT 7.7  --   --   --   --   ALBUMIN 2.6*  --   --   --   --   AST 23  --   --   --   --   ALT 17  --   --   --   --   ALKPHOS 231*  --   --   --   --  BILITOT 0.5  --   --   --   --   GFRNONAA >60   < > >60 >60 >60  ANIONGAP 12   < > 13 7 9    < > = values in this interval not displayed.    Lipids No results for input(s): "CHOL", "TRIG", "HDL", "LABVLDL", "LDLCALC", "CHOLHDL" in the last 168 hours.  Hematology Recent Labs  Lab 05/28/22 0854 05/30/22 0523 05/31/22 0409  WBC 15.4* 10.0 10.1  RBC 3.40* 3.43* 3.55*  HGB 10.4* 10.4* 10.6*  HCT 31.4* 31.9* 33.3*  MCV 92.4 93.0 93.8  MCH 30.6 30.3 29.9  MCHC 33.1 32.6 31.8  RDW 13.2 13.1 13.2  PLT 692* 694* 709*   Thyroid  Recent Labs  Lab 05/24/22 1306  TSH 1.001    BNPNo results for input(s): "BNP", "PROBNP" in the last 168 hours.  DDimer No results for input(s): "DDIMER" in the last 168 hours.   Radiology    US RENAL  Result Date: 05/30/2022 CLINICAL DATA:  Pain. UTI. Status post percutaneous drain placement for right renal abscess. Right renal abscess not well demonstrated EXAM: RENAL / URINARY TRACT ULTRASOUND COMPLETE COMPARISON:  CT scan 05/24/2022 FINDINGS: Right Kidney: Renal measurements: 11.1 x 5.0 x 5.2 cm = volume: 163 mL. Echogenicity within normal limits. No mass or hydronephrosis visualized. Left  Kidney: Renal measurements: 12.2 x 6.4 x 5.3 cm = volume: 215 mL. Echogenicity within normal limits. No mass or hydronephrosis visualized. Bladder: Appears normal for degree of bladder distention. Other: None. IMPRESSION: Known right renal abscess not well demonstrated by ultrasound today. No hydronephrosis in either kidney. Consider follow-up CT imaging with intravenous contrast for more definitive assessment. Electronically Signed   By: Misty Stanley M.D.   On: 05/30/2022 12:54    Cardiac Studies   2D echo 05/26/2022: 1. Left ventricular ejection fraction, by estimation, is 60 to 65%. The  left ventricle has normal function. The left ventricle has no regional  wall motion abnormalities. Left ventricular diastolic parameters are  indeterminate.   2. Right ventricular systolic function is normal. The right ventricular  size is normal. There is normal pulmonary artery systolic pressure. The  estimated right ventricular systolic pressure is [id number] mmHg.   3. The mitral valve is normal in structure. No evidence of mitral valve  regurgitation. No evidence of mitral stenosis.   4. The aortic valve has an indeterminant number of cusps. Aortic valve  regurgitation is not visualized. No aortic stenosis is present.   5. The inferior vena cava is normal in size with greater than 50%  respiratory variability, suggesting right atrial pressure of 3 mmHg.   6. There is a very small patent foramen ovale with minimal left to right  shunting across the atrial septum.     Patient Profile     42 y.o. female  with history o     f diabetes, pyelonephritis, recurrent UTIs, and nephrolithiasis who is being evaluated for new onset A-fib/flutter    Assessment & Plan    New on set Afib/flutter - presented in the setting of severe sepsis due to right renal abscess - started on metoprolol 50mg  BID and flecainide 100mg  BID - she is in and out of afib/flutter - CHADSVASC of 2 (female and DM2), not on a/c due to acute  infection - TSH wnl - Keep Mag>2 and K>4 - patient will need heart monitor at discharge. She will need to see EP as OP.  For questions or updates, please contact  [facility] Please consult [email or web address] for contact info under        Signed, Daisy Mcneel Ninfa Meeker, PA-C  05/31/2022, 10:44 AM

## 2022-05-31 NOTE — Progress Notes (Signed)
Inpatient Diabetes Program Recommendations  AACE/ADA: New Consensus Statement on Inpatient Glycemic Control (2015)  Target Ranges:  Prepandial:   less than 140 mg/dL      Peak postprandial:   less than 180 mg/dL (1-2 hours)      Critically ill patients:  140 - 180 mg/dL   Lab Results  Component Value Date   GLUCAP 184 (H) 05/31/2022   HGBA1C 7.4 (H) 05/25/2022    Review of Glycemic Control  Latest Reference Range & Units 05/30/22 07:58 05/30/22 12:21 05/30/22 16:39 05/30/22 17:17 05/30/22 21:22 05/31/22 07:44  Glucose-Capillary 70 - 99 mg/dL 247 (H) 190 (H) 54 (L) 86 205 (H) 184 (H)  Diabetes history: DM1 (requires basal, meal coverage & correction) Outpatient Diabetes medications: Novolin N 20-22 units ac breakfast, 18-20 units ac supper, Novolin R 20-24 units ac breakfast, 18-20 units ac supper Current orders for Inpatient glycemic control:  Novolog 0-15 units tid with meals and HS Semglee 25 units bid Novolog 10 units tid with meals  Inpatient Diabetes Program Recommendations:   May consider slight reduction in meal coverage to 8 units tid with meals (hold if patient eats less than 50% or NPO) and also may consider reducing Novolog correction to "sensitive" 0-9 units tid with meals.   Thanks,  Adah Perl, RN, BC-ADM Inpatient Diabetes Coordinator Pager [phone]  (8a-5p)

## 2022-06-01 NOTE — Discharge Summary (Signed)
Physician Discharge Summary   Patient: Katie Hampton MRN: [id number] DOB: Feb 19, 1981  Admit date:     05/24/2022  Discharge date: 05/31/2022  Discharge Physician: Max Sane   PCP: Candida Peeling, PA-C   Recommendations at discharge:   Follow-up with outpatient providers as requested  Discharge Diagnoses: Principal Problem:   Abscess of right kidney Active Problems:   Acute sepsis (HCC)   Diabetes mellitus ([place])   Peripheral neuropathy   HLD (hyperlipidemia)   Paroxysmal atrial fibrillation (HCC)   Obesity (BMI 30-39.9)   Atrial flutter ([place])   Acute pyelonephritis   Persistent atrial fibrillation [facility])  Hospital Course: Katie Hampton is a 42 y.o. female with medical history significant of pyelonephritis, HLD, diabetes mellitus, peripheral neuropathy, obesity, who presents with right flank pain, burning on urination.   Patient was diagnosed with UTI and started on Bactrim on 3/1.  She continues to have burning on urination, mild dysuria and increased urinary frequency.  She also noticed little blood in her urine occasionally. She developed right flank pain, which is constant, aching, moderate, nonradiating.     CT abd/pelvis Inflammatory changes involving the right kidney with poor enhancement and multiple fluid collections consistent with abscess formation and underlying pyelonephritis. Non-obstructing right-sided renal stone.   3/11--urology and cardiology consult 3/12--ID consult, s/p Right renal abscess drain placement. 3/13 remains in atrial flutter, on metoprolol and flecainide.  Sugars running high so adjusted insulin 3/14: Checking renal ultrasound to evaluate her abscess  Assessment and Plan:  Severe sepsis due to renal abscess, right: Present on admission.  criteria for severe sepsis with WBC 18.8, heart rate of 112, lactic acid 3.1.   --Tolerating po diet --remains afebrile --WBC 18K--15K-10K --3/12--s/p Right renal abscess drain placement. IR recommends  to keep the drain until output decreases less than 10 mL a day.   -She will follow-up with IR on 3/18 to decide removal for tube/drain -She was treated with IV meropenem while in the hospital and being discharged on oral antibiotic/Levaquin per ID recommendation -She will need follow-up with ID and urology   Diabetes mellitus (type 1 diabetes per patient, with peripheral neuropathy)uncontrolled:  Treated with sliding scale insulin while in the hospital along with insulin Semglee --A1c7.4%   Peripheral neuropathy -Neurontin   HLD (hyperlipidemia) -Fenofibrate   New onset atrial fibrillation/flutter ([place]): EKG seems to have new onset A flutter/A-fib with variable AV block.  Heart rate 100-110.  CHADS2 score is  2(DM and female). Likely triggered by severe sepsis. -Seen by Dr. Rockey Situ for cardiology -pt goes in afib intermittently. Started on Flecainde by Dr Caryl Comes. No AC at present. Zio monitor will be mailed at her home per cardiology --cont BB + Flecanide   Obesity (BMI 30-39.9): Body weight 100.7 kg, BMI 39.33 -Healthy diet, and exercise -Encourage losing weight          Consultants: Cardiology, urology, IR, ID Procedures performed: IR guided drain placement on 3/12 Disposition: Home Diet recommendation:  Discharge Diet Orders (From admission, onward)     Start     Ordered   05/31/22 0000  Diet - low sodium heart healthy        05/31/22 1126           Carb modified diet DISCHARGE MEDICATION: Allergies as of 05/31/2022   No Known Allergies      Medication List     STOP taking these medications    cefdinir 300 MG capsule Commonly known as: OMNICEF   potassium chloride SA 20 MEQ  tablet Commonly known as: KLOR-CON M   sulfamethoxazole-trimethoprim 800-160 MG tablet Commonly known as: BACTRIM DS       TAKE these medications    AIRBORNE PO Take 1 tablet by mouth daily.   Aleve 220 MG tablet Generic drug: naproxen sodium Take 880 mg by mouth 2 (two)  times daily as needed (pain).   Azelastine HCl 137 MCG/SPRAY Soln Place 2 sprays into both nostrils 2 (two) times daily.   BAYER BACK & BODY PO Take 1 tablet by mouth daily as needed (pain).   fenofibrate micronized 200 MG capsule Commonly known as: LOFIBRA Take 200 mg by mouth daily.   flecainide 100 MG tablet Commonly known as: TAMBOCOR Take 1 tablet (100 mg total) by mouth every 12 (twelve) hours.   gabapentin 600 MG tablet Commonly known as: NEURONTIN Take 600 mg by mouth at bedtime.   HYDROcodone-acetaminophen 5-325 MG tablet Commonly known as: Norco Take 1 tablet by mouth every 4 (four) hours as needed for moderate pain.   ibuprofen 800 MG tablet Commonly known as: ADVIL Take 800 mg by mouth 3 (three) times daily as needed.   insulin NPH Human 100 UNIT/ML injection Commonly known as: NOVOLIN N Inject 18-22 Units into the skin 2 (two) times daily before a meal. Per patients sliding scale: breakfast - 20-22 units,  supper 18-20 units   insulin regular 100 units/mL injection Commonly known as: NOVOLIN R Inject 18-24 Units into the skin 2 (two) times daily before a meal. Per patients sliding scale: breakfast 20-24 units, supper 18-20   levofloxacin 750 MG tablet Commonly known as: Levaquin Take 1 tablet (750 mg total) by mouth daily for 21 days.   LUTEIN PO Take 1 capsule by mouth 2 (two) times daily.   metoprolol tartrate 50 MG tablet Commonly known as: LOPRESSOR Take 1 tablet (50 mg total) by mouth 2 (two) times daily.   multivitamin with minerals Tabs tablet Take 1 tablet by mouth daily.   ondansetron 8 MG disintegrating tablet Commonly known as: ZOFRAN-ODT Take 8 mg by mouth every 8 (eight) hours as needed.   ondansetron 4 MG disintegrating tablet Commonly known as: ZOFRAN-ODT Take 1 tablet (4 mg total) by mouth every 8 (eight) hours as needed for nausea or vomiting.   SUMAtriptan 100 MG tablet Commonly known as: IMITREX Take 100 mg by mouth every 2  (two) hours as needed.   Vitamin D (Ergocalciferol) 1.25 MG (50000 UNIT) Caps capsule Commonly known as: DRISDOL Take 50,000 Units by mouth once a week.        Follow-up Information     Candida Peeling, PA-C Follow up on 06/06/2022.   Specialty: Pain Medicine Why: [facility] Discharge F/UP Go at 11:30am. Contact information: [place] [address] [phone]         Minna Merritts, MD Follow up on 05/31/2022.   Specialty: Cardiology Why: [facility] Discharge F/UPGo at Contact information: [place] [address] [phone]         Tsosie Billing, MD. Schedule an appointment as soon as possible for a visit on 06/13/2022.   Specialty: Infectious Diseases Why: [facility] Discharge F/UP as scheduled. Go at 10:45am. Contact information: [place] [address] [phone]         Debroah Loop, PA-C Follow up on 06/03/2022.   Specialty: Urology Why: [facility] Discharge F/UP. Go at 2:30pm. Contact information: [place] [address] [phone]  Lura Em, PA. Schedule an appointment as soon as possible for a visit in 1 week(s).   Specialty: Radiology Why: [facility] Discharge F/UP. Call to schedule your appointment. Contact information: [place] [address] [phone]                Discharge Exam: Danley Danker Weights   05/24/22 1255 05/28/22 1039  Weight: 100.7 kg 99.8 kg   GENERAL:  42 y.o.-year-old patient with no acute distress. Obese LUNGS: Normal breath sounds bilaterally, no wheezing CARDIOVASCULAR: S1, S2 normal. No murmur  intermittent tachycardia ABDOMEN: Soft, nontender, nondistended. Bowel sounds present. Right flank drain + EXTREMITIES: No  edema b/l.    NEUROLOGIC: nonfocal  patient is alert and awake SKIN: No obvious rash, lesion, or ulcer.   Condition at discharge:  good  The results of significant diagnostics from this hospitalization (including imaging, microbiology, ancillary and laboratory) are listed below for reference.   Imaging Studies: US RENAL  Result Date: 05/30/2022 CLINICAL DATA:  Pain. UTI. Status post percutaneous drain placement for right renal abscess. Right renal abscess not well demonstrated EXAM: RENAL / URINARY TRACT ULTRASOUND COMPLETE COMPARISON:  CT scan 05/24/2022 FINDINGS: Right Kidney: Renal measurements: 11.1 x 5.0 x 5.2 cm = volume: 163 mL. Echogenicity within normal limits. No mass or hydronephrosis visualized. Left Kidney: Renal measurements: 12.2 x 6.4 x 5.3 cm = volume: 215 mL. Echogenicity within normal limits. No mass or hydronephrosis visualized. Bladder: Appears normal for degree of bladder distention. Other: None. IMPRESSION: Known right renal abscess not well demonstrated by ultrasound today. No hydronephrosis in either kidney. Consider follow-up CT imaging with intravenous contrast for more definitive assessment. Electronically Signed   By: Misty Stanley M.D.   On: 05/30/2022 12:54   CT GUIDED VISCERAL FLUID DRAIN BY PERC CATH  Result Date: 05/28/2022 INDICATION: 42 year old female with history of acute right pyelonephritis complicated by abscess formation without significant clinical improvement on intravenous antibiotics. EXAM: 1. CT IMAGE GUIDED DRAINAGE BY PERCUTANEOUS CATHETER 2. CT IMAGE GUIDED DRAINAGE BY PERCUTANEOUS CATHETER COMPARISON:  05/24/2022 MEDICATIONS: The patient is currently admitted to the hospital and receiving intravenous antibiotics. The antibiotics were administered within an appropriate time frame prior to the initiation of the procedure. ANESTHESIA/SEDATION: Moderate (conscious) sedation was employed during this procedure. A total of Versed 3 mg and Fentanyl 75 mcg was administered intravenously. Moderate Sedation Time: 36 minutes. The patient's level of consciousness and vital signs were monitored  continuously by radiology nursing throughout the procedure under my direct supervision. CONTRAST:  None COMPLICATIONS: None immediate. PROCEDURE: RADIATION DOSE REDUCTION: This exam was performed according to the departmental dose-optimization program which includes automated exposure control, adjustment of the mA and/or kV according to patient size and/or use of iterative reconstruction technique. Informed written consent was obtained from the patient after a discussion of the risks, benefits and alternatives to treatment. The patient was placed on the CT gantry and a pre procedural CT was performed re-demonstrating the known abscess/fluid collections within the right kidney. The procedure was planned. A timeout was performed prior to the initiation of the procedure. The right flank was prepped and draped in the usual sterile fashion. The overlying soft tissues were anesthetized with 1% lidocaine with epinephrine. Appropriate trajectory was planned with the use of a 22 gauge spinal needle. An 18 gauge trocar needle was advanced into the interpolar abscess/fluid collection and a short Amplatz super stiff wire was coiled within the collection. Appropriate positioning was confirmed with a  limited CT scan. The tract was serially dilated allowing placement of a 10 French mini loop Skater drainage catheter. Appropriate positioning was confirmed with a limited postprocedural CT scan. Approximately 5 ml of purulent fluid was aspirated. The tube was connected to a bulb suction and sutured in place. Attention was then turned toward the upper pole abscess. The overlying soft tissues were anesthetized with 1% lidocaine with epinephrine. Appropriate trajectory was planned with the use of a 22 gauge spinal needle. An 18 gauge trocar needle was advanced into the upper pole abscess/fluid collection and a short Amplatz super stiff wire was coiled within the collection. Appropriate positioning was confirmed with a limited CT scan.  The tract was serially dilated allowing placement of a 10 French mini loop Skater drainage catheter. Appropriate positioning was confirmed with a limited postprocedural CT scan. Approximately 5 ml of purulent fluid was aspirated. The tube was connected to a bulb suction and sutured in place. A dressing was placed. The patient tolerated the procedure well without immediate post procedural complication. IMPRESSION: Successful CT guided placement of 10 French mini loop Skater drain catheters into the interpolar and upper pole renal abscesses with aspiration of 10 mL of purulent fluid. Samples were sent to the laboratory as requested by the ordering clinical team. Ruthann Cancer, MD [facility] Radiology Electronically Signed   By: Ruthann Cancer M.D.   On: 05/28/2022 13:02   CT GUIDED VISCERAL FLUID DRAIN BY PERC CATH  Result Date: 05/28/2022 INDICATION: 42 year old female with history of acute right pyelonephritis complicated by abscess formation without significant clinical improvement on intravenous antibiotics. EXAM: 1. CT IMAGE GUIDED DRAINAGE BY PERCUTANEOUS CATHETER 2. CT IMAGE GUIDED DRAINAGE BY PERCUTANEOUS CATHETER COMPARISON:  05/24/2022 MEDICATIONS: The patient is currently admitted to the hospital and receiving intravenous antibiotics. The antibiotics were administered within an appropriate time frame prior to the initiation of the procedure. ANESTHESIA/SEDATION: Moderate (conscious) sedation was employed during this procedure. A total of Versed 3 mg and Fentanyl 75 mcg was administered intravenously. Moderate Sedation Time: 36 minutes. The patient's level of consciousness and vital signs were monitored continuously by radiology nursing throughout the procedure under my direct supervision. CONTRAST:  None COMPLICATIONS: None immediate. PROCEDURE: RADIATION DOSE REDUCTION: This exam was performed according to the departmental dose-optimization program which  includes automated exposure control, adjustment of the mA and/or kV according to patient size and/or use of iterative reconstruction technique. Informed written consent was obtained from the patient after a discussion of the risks, benefits and alternatives to treatment. The patient was placed on the CT gantry and a pre procedural CT was performed re-demonstrating the known abscess/fluid collections within the right kidney. The procedure was planned. A timeout was performed prior to the initiation of the procedure. The right flank was prepped and draped in the usual sterile fashion. The overlying soft tissues were anesthetized with 1% lidocaine with epinephrine. Appropriate trajectory was planned with the use of a 22 gauge spinal needle. An 18 gauge trocar needle was advanced into the interpolar abscess/fluid collection and a short Amplatz super stiff wire was coiled within the collection. Appropriate positioning was confirmed with a limited CT scan. The tract was serially dilated allowing placement of a 10 French mini loop Skater drainage catheter. Appropriate positioning was confirmed with a limited postprocedural CT scan. Approximately 5 ml of purulent fluid was aspirated. The tube was connected to a bulb suction and sutured in place. Attention was then turned toward the upper pole abscess. The  overlying soft tissues were anesthetized with 1% lidocaine with epinephrine. Appropriate trajectory was planned with the use of a 22 gauge spinal needle. An 18 gauge trocar needle was advanced into the upper pole abscess/fluid collection and a short Amplatz super stiff wire was coiled within the collection. Appropriate positioning was confirmed with a limited CT scan. The tract was serially dilated allowing placement of a 10 French mini loop Skater drainage catheter. Appropriate positioning was confirmed with a limited postprocedural CT scan. Approximately 5 ml of purulent fluid was aspirated. The tube was connected to a  bulb suction and sutured in place. A dressing was placed. The patient tolerated the procedure well without immediate post procedural complication. IMPRESSION: Successful CT guided placement of 10 French mini loop Skater drain catheters into the interpolar and upper pole renal abscesses with aspiration of 10 mL of purulent fluid. Samples were sent to the laboratory as requested by the ordering clinical team. Ruthann Cancer, MD [facility] Radiology Electronically Signed   By: Ruthann Cancer M.D.   On: 05/28/2022 13:02   ECHOCARDIOGRAM COMPLETE  Result Date: 05/26/2022    ECHOCARDIOGRAM REPORT   Patient Name:   Katie Hampton Date of Exam: 05/26/2022 Medical Rec #:  [id number]        Height:       63.0 in Accession #:    [id number]       Weight:       222.0 lb Date of Birth:  06/03/1980       BSA:          2.021 m Patient Age:    88 years         BP:           120/82 mmHg Patient Gender: F                HR:           88 bpm. Exam Location:  [facility] Procedure: 2D Echo, Color Doppler, Cardiac Doppler and Intracardiac            Opacification Agent Indications:     Atrial Fibrillation I48.91  History:         Patient has no prior history of Echocardiogram examinations.                  Risk Factors:Diabetes and Morbid Obesity.  Sonographer:     L. Thornton-Maynard Referring Phys:  [id number] SHERI HAMMOCK Diagnosing Phys: Ida Rogue MD  Sonographer Comments: Suboptimal apical window and patient is obese. IMPRESSIONS  1. Left ventricular ejection fraction, by estimation, is 60 to 65%. The left ventricle has normal function. The left ventricle has no regional wall motion abnormalities. Left ventricular diastolic parameters are indeterminate.  2. Right ventricular systolic function is normal. The right ventricular size is normal. There is normal pulmonary artery systolic pressure. The estimated right ventricular systolic pressure is [id number] mmHg.  3. The mitral valve is normal  in structure. No evidence of mitral valve regurgitation. No evidence of mitral stenosis.  4. The aortic valve has an indeterminant number of cusps. Aortic valve regurgitation is not visualized. No aortic stenosis is present.  5. The inferior vena cava is normal in size with greater than 50% respiratory variability, suggesting right atrial pressure of 3 mmHg.  6. There is a very small patent foramen ovale with minimal left to right shunting across the atrial septum. FINDINGS  Left Ventricle: Left ventricular ejection fraction, by estimation, is 60 to  65%. The left ventricle has normal function. The left ventricle has no regional wall motion abnormalities. Definity contrast agent was given IV to delineate the left ventricular  endocardial borders. The left ventricular internal cavity size was normal in size. There is no left ventricular hypertrophy. Left ventricular diastolic parameters are indeterminate. Right Ventricle: The right ventricular size is normal. No increase in right ventricular wall thickness. Right ventricular systolic function is normal. There is normal pulmonary artery systolic pressure. The tricuspid regurgitant velocity is 2.40 m/s, and  with an assumed right atrial pressure of 3 mmHg, the estimated right ventricular systolic pressure is [id number] mmHg. Left Atrium: Left atrial size was normal in size. Right Atrium: Right atrial size was normal in size. Pericardium: There is no evidence of pericardial effusion. Mitral Valve: The mitral valve is normal in structure. No evidence of mitral valve regurgitation. No evidence of mitral valve stenosis. Tricuspid Valve: The tricuspid valve is normal in structure. Tricuspid valve regurgitation is mild . No evidence of tricuspid stenosis. Aortic Valve: The aortic valve has an indeterminant number of cusps. Aortic valve regurgitation is not visualized. No aortic stenosis is present. Aortic valve mean gradient measures 6.0 mmHg. Aortic valve peak gradient measures  10.3 mmHg. Aortic valve area, by VTI measures 1.34 cm. Pulmonic Valve: The pulmonic valve was normal in structure. Pulmonic valve regurgitation is not visualized. No evidence of pulmonic stenosis. Aorta: The aortic root is normal in size and structure. Venous: The inferior vena cava is normal in size with greater than 50% respiratory variability, suggesting right atrial pressure of 3 mmHg. IAS/Shunts: No atrial level shunt detected by color flow Doppler. A small patent foramen ovale is detected with predominantly left to right shunting across the atrial septum.  LEFT VENTRICLE PLAX 2D LVIDd:         4.30 cm     Diastology LVIDs:         3.10 cm     LV e' medial:    11.10 cm/s LV PW:         1.10 cm     LV E/e' medial:  9.5 LV IVS:        0.90 cm     LV e' lateral:   11.70 cm/s LVOT diam:     1.80 cm     LV E/e' lateral: 9.1 LV SV:         36 LV SV Index:   18 LVOT Area:     2.54 cm  LV Volumes (MOD) LV vol d, MOD A2C: 51.8 ml LV vol d, MOD A4C: 81.4 ml LV vol s, MOD A2C: 20.3 ml LV vol s, MOD A4C: 24.5 ml LV SV MOD A2C:     31.5 ml LV SV MOD A4C:     81.4 ml LV SV MOD BP:      45.7 ml LEFT ATRIUM             Index        RIGHT ATRIUM           Index LA diam:        4.20 cm 2.08 cm/m   RA Area:     20.50 cm LA Vol (A2C):   34.6 ml 17.12 ml/m  RA Volume:   62.70 ml  31.02 ml/m LA Vol (A4C):   33.6 ml 16.62 ml/m LA Biplane Vol: 34.9 ml 17.27 ml/m  AORTIC VALVE  PULMONIC VALVE AV Area (Vmax):    1.43 cm      PV Vmax:       1.14 m/s AV Area (Vmean):   1.35 cm      PV Peak grad:  5.2 mmHg AV Area (VTI):     1.34 cm AV Vmax:           160.50 cm/s AV Vmean:          111.500 cm/s AV VTI:            0.267 m AV Peak Grad:      10.3 mmHg AV Mean Grad:      6.0 mmHg LVOT Vmax:         90.10 cm/s LVOT Vmean:        59.200 cm/s LVOT VTI:          0.141 m LVOT/AV VTI ratio: 0.53  AORTA Ao Root diam: 2.80 cm Ao Asc diam:  2.60 cm MV E velocity: 106.00 cm/s  TRICUSPID VALVE MV A velocity: 87.00 cm/s   TR  Peak grad:   23.0 mmHg MV E/A ratio:  1.22         TR Vmax:        240.00 cm/s                              SHUNTS                             Systemic VTI:  0.14 m                             Systemic Diam: 1.80 cm Ida Rogue MD Electronically signed by Ida Rogue MD Signature Date/Time: 05/26/2022/2:20:58 PM    Final    CT ABDOMEN PELVIS W CONTRAST  Result Date: 05/24/2022 CLINICAL DATA:  Renal abscess.  Sepsis.  Recent UTI. EXAM: CT ABDOMEN AND PELVIS WITH CONTRAST TECHNIQUE: Multidetector CT imaging of the abdomen and pelvis was performed using the standard protocol following bolus administration of intravenous contrast. RADIATION DOSE REDUCTION: This exam was performed according to the departmental dose-optimization program which includes automated exposure control, adjustment of the mA and/or kV according to patient size and/or use of iterative reconstruction technique. CONTRAST:  129mL OMNIPAQUE IOHEXOL 300 MG/ML  SOLN COMPARISON:  Noncontrast CT 05/24/2022 earlier FINDINGS: Lower chest: Minimal basilar scar or atelectasis. No pleural effusion. There is possible enlarged node to the right of the esophagus at the edge of the imaging field on series 2, image 1 measuring 13 mm. Hepatobiliary: No space-occupying liver lesion. Patent portal vein. Previous cholecystectomy. Pancreas: Mild pancreatic atrophy without mass. Spleen: Borderline enlarged spleen at 13 cm. Adrenals/Urinary Tract: Adrenal glands are preserved. No enhancing lesion in the left kidney. No collecting system dilatation. Once again there is a 3 mm stone lower pole of the right kidney. There is enlargement of the right kidney with severe perinephric stranding and thickening. There are several areas of poor enhancement along the mid to upper aspect of the right kidney with several fluid collections. Largest is seen superiorly and lateral on series 2 image 32 measuring 3.5 by 3.0 cm in the axial plane. Cephalocaudal 3.1 cm. There are  additional foci extending along the midportion of the kidney laterally which is multiloculated. On axial image 40 of series 2 3.2 by 2.1 by  3.8 cm. This may extend subcapsular as well. Additional smaller foci slightly above this in addition. Overall cephalocaudal extent a proximally 7.3 cm. These are worrisome for abscess formation. No collecting system dilatation of the right kidney. Preserved contours of the urinary bladder. Stomach/Bowel: Moderate colonic stool. Large bowel is of normal course and caliber normal appendix. Small bowel is nondilated. Stomach is nondilated. Vascular/Lymphatic: Normal caliber aorta and IVC. There are several small nodes identified in the retroperitoneum. These are less than a cm in short axis and not pathologic by size criteria. These could be reactive. Reproductive: Uterus and bilateral adnexa are unremarkable. Other: No free air. No ascites. Small fat containing umbilical hernia. Musculoskeletal: Mild degenerative changes along the spine with some curvature. IMPRESSION: Inflammatory changes involving the right kidney with poor enhancement and multiple fluid collections consistent with abscess formation and underlying pyelonephritis. Nonobstructing right-sided renal stone. Critical Value/emergent results were called by telephone at the time of interpretation on 05/24/2022 at 12:30 pm to provider Ashok Cordia , who verbally acknowledged these results. Electronically Signed   By: Jill Side M.D.   On: 05/24/2022 15:32   CT Renal Stone Study  Result Date: 05/24/2022 CLINICAL DATA:  Flank pain. EXAM: CT ABDOMEN AND PELVIS WITHOUT CONTRAST TECHNIQUE: Multidetector CT imaging of the abdomen and pelvis was performed following the standard protocol without IV contrast. RADIATION DOSE REDUCTION: This exam was performed according to the departmental dose-optimization program which includes automated exposure control, adjustment of the mA and/or kV according to patient size and/or use of  iterative reconstruction technique. COMPARISON:  CT 05/17/2022 and older FINDINGS: Lower chest: There is some breathing motion along the lung bases. Mild scar or atelectasis. No pleural effusion. These areas are improving from previous. Hepatobiliary: On this non IV contrast exam, grossly the liver is preserved. Previous cholecystectomy. Pancreas: Global pancreatic atrophy without obvious mass lesion or ductal dilatation. Spleen: Once again borderline enlarged spleen measuring 13 cm in cephalocaudal length. Adrenals/Urinary Tract: Adrenal glands are preserved. No abnormal calcification in the left kidney nor along the course of the left ureter. Once again there is enlargement of the right kidney with stranding of the perinephric space. The amount of stranding is increasing. There is a nonobstructing stone in the lower pole of the right kidney. No right ureteral stone. There are developing cystic areas identified as well at this time towards the upper aspect of the right kidney. These are somewhat ill-defined on this noncontrast exam but for example area on series 2, image 28 measures 3.5 by 3.7 by 3.1 cm. Additional smaller area more caudal as seen on axial image 35 of series 2 measuring 2.8 by 2.4 cm. With the provided history of pyelonephritis, developing abscess or phlegmonous changes possible recommend further evaluation. A contrast study may be of some benefit if the patient is clinically able to further define these areas. Stomach/Bowel: Large bowel has a normal course and caliber with mild scattered stool. Normal appendix. The stomach and small bowel are nondilated. Vascular/Lymphatic: Normal caliber aorta and IVC. There are some small retroperitoneal nodes identified which are not pathologic by size criteria. Reproductive: Uterus and bilateral adnexa are unremarkable. Other: No ascites or free air. Musculoskeletal: Mild curvature of the spine. Minimal degenerative change. IMPRESSION: 3 mm nonobstructing  lower pole right-sided renal stone again seen. Increasing perinephric stranding with increasing heterogeneous appearance of the renal parenchyma with developing ill-defined low-density areas superior laterally. No associated air. With patient's history of infection, developing abscess or phlegmonous changes possible. These areas  could be defined further within IV contrast examination if clinically able. Electronically Signed   By: Jill Side M.D.   On: 05/24/2022 14:15   CT Renal Stone Study  Result Date: 05/17/2022 CLINICAL DATA:  Flank pain.  Recent diagnosis of pyelonephritis EXAM: CT ABDOMEN AND PELVIS WITHOUT CONTRAST TECHNIQUE: Multidetector CT imaging of the abdomen and pelvis was performed following the standard protocol without IV contrast. RADIATION DOSE REDUCTION: This exam was performed according to the departmental dose-optimization program which includes automated exposure control, adjustment of the mA and/or kV according to patient size and/or use of iterative reconstruction technique. COMPARISON:  Renal stone CT 08/23/2020 and older FINDINGS: Lower chest: Bandlike opacities along the bases. Favor atelectasis. Trace right-sided pleural fluid. Hepatobiliary: On this non IV contrast exam the liver is grossly preserved. Previous cholecystectomy. Pancreas: Global mild pancreatic atrophy without obvious mass or ductal dilatation. Appearance is similar to prior. Spleen: Spleen measures 12.9 cm in cephalocaudal length, borderline enlarged. Adrenals/Urinary Tract: Adrenal glands are preserved. No abnormal calcifications seen within the left kidney nor along the course of the left ureter. No left-sided renal collecting system dilatation. Preserved contours of the urinary bladder There is enlargement of the right kidney with stranding and slightly nodular contour thickening along the lateral aspect with heterogeneity of the parenchyma. This could go along with patient's provided history of pyelonephritis.  There is a nonobstructing 3 mm lower pole right-sided renal stone without collecting system dilatation. Stomach/Bowel: On this non oral contrast exam, the large bowel has a normal course and caliber with scattered stool. Normal appendix. Mild debris in the stomach. Small bowel is nondilated. Vascular/Lymphatic: Normal caliber aorta and IVC with atherosclerotic change. No specific abnormal lymph node enlargement identified in the abdomen and pelvis. Reproductive: Uterus and bilateral adnexa are unremarkable. Other: Mild anasarca. No ascites. Small fat containing umbilical hernia Musculoskeletal: No acute or significant osseous findings. IMPRESSION: 3 mm nonobstructing lower pole right-sided renal stone. No ureteral stones. There is significant perinephric stranding on the right with enlargement of the right kidney and heterogeneous appearance of the renal parenchyma. The changes could go along with the patient's provided history of pyelonephritis. If there is concern of the sequela of pyelonephritis including abscess or phlegmonous formation, an IV contrast study may be of some benefit for higher sensitivity. Borderline enlarged spleen Electronically Signed   By: Jill Side M.D.   On: 05/17/2022 19:46    Microbiology: Results for orders placed or performed during the hospital encounter of 05/24/22  Urine Culture     Status: Abnormal   Collection Time: 05/24/22  1:06 PM   Specimen: Urine, Clean Catch  Result Value Ref Range Status   Specimen Description   Final    URINE, CLEAN CATCH Performed at [facility], [address] [place], [place] [address]    Special Requests   Final    NONE Performed at [facility], [address] [place], [place] [address]    Culture (A)  Final    <10,000 COLONIES/mL INSIGNIFICANT GROWTH Performed at [facility], [place] [address] [place], [place] [address]    Report Status 05/26/2022 FINAL  Final  Blood culture (routine x 2)     Status:  None   Collection Time: 05/24/22  1:31 PM   Specimen: BLOOD RIGHT ARM  Result Value Ref Range Status   Specimen Description BLOOD RIGHT ARM  Final   Special Requests   Final    BOTTLES DRAWN AEROBIC AND ANAEROBIC Blood  Culture results may not be optimal due to an excessive volume of blood received in culture bottles   Culture   Final    NO GROWTH 5 DAYS Performed at [facility], [address] [place], [place] [address]    Report Status 05/29/2022 FINAL  Final  Blood culture (routine x 2)     Status: None   Collection Time: 05/24/22  1:31 PM   Specimen: BLOOD RIGHT FOREARM  Result Value Ref Range Status   Specimen Description BLOOD RIGHT FOREARM  Final   Special Requests   Final    BOTTLES DRAWN AEROBIC AND ANAEROBIC Blood Culture adequate volume   Culture   Final    NO GROWTH 5 DAYS Performed at [facility], [address] [place], [place] [address]    Report Status 05/29/2022 FINAL  Final  Resp panel by RT-PCR (RSV, Flu A&B, Covid) Anterior Nasal Swab     Status: None   Collection Time: 05/24/22  1:31 PM   Specimen: Anterior Nasal Swab  Result Value Ref Range Status   SARS Coronavirus 2 by RT PCR NEGATIVE NEGATIVE Final    Comment: (NOTE) SARS-CoV-2 target nucleic acids are NOT DETECTED.  The SARS-CoV-2 RNA is generally detectable in upper respiratory specimens during the acute phase of infection. The lowest concentration of SARS-CoV-2 viral copies this assay can detect is 138 copies/mL. A negative result does not preclude SARS-Cov-2 infection and should not be used as the sole basis for treatment or other patient management decisions. A negative result may occur with  improper specimen collection/handling, submission of specimen other than nasopharyngeal swab, presence of viral mutation(s) within the areas targeted by this assay, and inadequate number of viral copies(<138 copies/mL). A negative result must be combined with clinical observations,  patient history, and epidemiological information. The expected result is Negative.  Fact Sheet for Patients:  EntrepreneurPulse.com.au  Fact Sheet for Healthcare Providers:  IncredibleEmployment.be  This test is no t yet approved or cleared by the [place] FDA and  has been authorized for detection and/or diagnosis of SARS-CoV-2 by FDA under an Emergency Use Authorization (EUA). This EUA will remain  in effect (meaning this test can be used) for the duration of the COVID-19 declaration under Section 564(b)(1) of the Act, 21 U.S.C.section 360bbb-3(b)(1), unless the authorization is terminated  or revoked sooner.       Influenza A by PCR NEGATIVE NEGATIVE Final   Influenza B by PCR NEGATIVE NEGATIVE Final    Comment: (NOTE) The Xpert Xpress SARS-CoV-2/FLU/RSV plus assay is intended as an aid in the diagnosis of influenza from Nasopharyngeal swab specimens and should not be used as a sole basis for treatment. Nasal washings and aspirates are unacceptable for Xpert Xpress SARS-CoV-2/FLU/RSV testing.  Fact Sheet for Patients: EntrepreneurPulse.com.au  Fact Sheet for Healthcare Providers: IncredibleEmployment.be  This test is not yet approved or cleared by the [place] FDA and has been authorized for detection and/or diagnosis of SARS-CoV-2 by FDA under an Emergency Use Authorization (EUA). This EUA will remain in effect (meaning this test can be used) for the duration of the COVID-19 declaration under Section 564(b)(1) of the Act, 21 U.S.C. section 360bbb-3(b)(1), unless the authorization is terminated or revoked.     Resp Syncytial Virus by PCR NEGATIVE NEGATIVE Final    Comment: (NOTE) Fact Sheet for Patients: EntrepreneurPulse.com.au  Fact Sheet for Healthcare Providers: IncredibleEmployment.be  This test is not yet approved or cleared by the [place] FDA and has been  authorized for detection and/or diagnosis of SARS-CoV-2 by FDA under an Emergency Use Authorization (EUA). This EUA will remain in effect (meaning this test can be used) for the duration of the COVID-19 declaration under Section 564(b)(1) of the Act, 21 U.S.C. section 360bbb-3(b)(1), unless the authorization is terminated or revoked.  Performed at [facility], [address] [place], [place] [address]   Aerobic/Anaerobic Culture w Gram Stain (surgical/deep wound)     Status: None (Preliminary result)   Collection Time: 05/28/22 12:16 PM   Specimen: Abscess  Result Value Ref Range Status   Specimen Description   Final    ABSCESS Performed at [facility], [address], [place], [place] [address]    Special Requests   Final    RIGHT KIDNEY ABSCESS Performed at [facility], [address] [place], [place] [address]    Gram Stain   Final    ABUNDANT WBC PRESENT, PREDOMINANTLY PMN RARE GRAM NEGATIVE RODS Performed at [facility], [place] [address] [place], [place] [address]    Culture   Final    RARE KLEBSIELLA PNEUMONIAE NO ANAEROBES ISOLATED; CULTURE IN PROGRESS FOR 5 DAYS    Report Status PENDING  Incomplete   Organism ID, Bacteria KLEBSIELLA PNEUMONIAE  Final      Susceptibility   Klebsiella pneumoniae - MIC*    AMPICILLIN >=32 RESISTANT Resistant     CEFEPIME <=0.12 SENSITIVE Sensitive     CEFTAZIDIME <=1 SENSITIVE Sensitive     CEFTRIAXONE <=0.25 SENSITIVE Sensitive     CIPROFLOXACIN <=0.25 SENSITIVE Sensitive     GENTAMICIN <=1 SENSITIVE Sensitive     IMIPENEM <=0.25 SENSITIVE Sensitive     TRIMETH/SULFA <=20 SENSITIVE Sensitive     AMPICILLIN/SULBACTAM 4 SENSITIVE Sensitive     PIP/TAZO <=4 SENSITIVE Sensitive     * RARE KLEBSIELLA PNEUMONIAE    Labs: CBC: Recent Labs  Lab 05/28/22 0854 05/30/22 0523 05/31/22 0409  WBC 15.4* 10.0 10.1  NEUTROABS 13.4*  --   --   HGB 10.4* 10.4* 10.6*  HCT  31.4* 31.9* 33.3*  MCV 92.4 93.0 93.8  PLT 692* 694* [id number]*   Basic Metabolic Panel: Recent Labs  Lab 05/28/22 0854 05/30/22 0523 05/31/22 0409  NA 129* 132* 136  K 5.1 3.7 3.7  CL 92* 97* 97*  CO2 24 28 30   GLUCOSE 478* 215* 155*  BUN 11 9 8   CREATININE 0.88 0.73 0.80  CALCIUM 8.6* 8.7* 8.9   Liver Function Tests: No results for input(s): "AST", "ALT", "ALKPHOS", "BILITOT", "PROT", "ALBUMIN" in the last 168 hours. CBG: Recent Labs  Lab 05/30/22 1639 05/30/22 1717 05/30/22 2122 05/31/22 0744 05/31/22 1150  GLUCAP 54* 86 205* 184* 121*    Discharge time spent: greater than 30 minutes.  Signed: Max Sane, MD [facility] 06/01/2022

## 2022-06-02 LAB — AEROBIC/ANAEROBIC CULTURE W GRAM STAIN (SURGICAL/DEEP WOUND)

## 2022-06-03 ENCOUNTER — Other Ambulatory Visit: Payer: Self-pay | Admitting: Infectious Diseases

## 2022-06-03 ENCOUNTER — Ambulatory Visit: Payer: Medicare Other | Admitting: Physician Assistant

## 2022-06-03 VITALS — BP 125/80 | HR 71 | Ht 63.0 in | Wt 222.0 lb

## 2022-06-03 DIAGNOSIS — R300 Dysuria: Secondary | ICD-10-CM | POA: Diagnosis not present

## 2022-06-03 DIAGNOSIS — N151 Renal and perinephric abscess: Secondary | ICD-10-CM

## 2022-06-03 DIAGNOSIS — Z87440 Personal history of urinary (tract) infections: Secondary | ICD-10-CM | POA: Diagnosis not present

## 2022-06-03 NOTE — Patient Instructions (Addendum)
Recurrent UTI Prevention Strategies  Stay well hydrated. Eat a diet rich in fruit and vegetables. Start taking an over-the-counter cranberry supplement for urinary tract health. Take this once or twice daily on an empty stomach, e.g. right before bed. Start taking an over-the-counter d-mannose supplement. Take this daily per packaging instructions. Start taking an over-the-counter probiotic containing the bacterial species called Lactobacillus. Take this daily.

## 2022-06-03 NOTE — Progress Notes (Signed)
06/03/2022 3:32 PM   [name] 02-24-1981 [id number]  CC: Chief Complaint  Patient presents with   Follow-up   HPI: Katie Hampton is a 42 y.o. female with PMH DM1 and recurrent UTIs recently admitted with a large multiloculated right renal abscess, on antibiotics per Dr. Delaine Lame and s/p right drain placement with IR who presents today for outpatient follow-up and recurrent UTI planning.   Today she reports she is premenopausal, s/p tubal ligation and not on hormonal contraceptives.  She denies any hot flashes or vaginal dryness.  She feels that there is some occasional correlation of her UTIs with sexual activity.  She admits to some chronic dysuria at baseline.  She reports that her recurrent UTIs had sudden onset about 2 years ago.  PMH: Past Medical History:  Diagnosis Date   Diabetes mellitus    Pyelonephritis     Surgical History: Past Surgical History:  Procedure Laterality Date   CHOLECYSTECTOMY     EYE SURGERY Bilateral    TUBAL LIGATION Bilateral     Home Medications:  Allergies as of 06/03/2022   No Known Allergies      Medication List        Accurate as of June 03, 2022  3:32 PM. If you have any questions, ask your nurse or doctor.          AIRBORNE PO Take 1 tablet by mouth daily.   Aleve 220 MG tablet Generic drug: naproxen sodium Take 880 mg by mouth 2 (two) times daily as needed (pain).   Azelastine HCl 137 MCG/SPRAY Soln Place 2 sprays into both nostrils 2 (two) times daily.   BAYER BACK & BODY PO Take 1 tablet by mouth daily as needed (pain).   fenofibrate micronized 200 MG capsule Commonly known as: LOFIBRA Take 200 mg by mouth daily.   flecainide 100 MG tablet Commonly known as: TAMBOCOR Take 1 tablet (100 mg total) by mouth every 12 (twelve) hours.   gabapentin 600 MG tablet Commonly known as: NEURONTIN Take 600 mg by mouth at bedtime.   HYDROcodone-acetaminophen 5-325 MG tablet Commonly known as:  Norco Take 1 tablet by mouth every 4 (four) hours as needed for moderate pain.   ibuprofen 800 MG tablet Commonly known as: ADVIL Take 800 mg by mouth 3 (three) times daily as needed.   insulin NPH Human 100 UNIT/ML injection Commonly known as: NOVOLIN N Inject 18-22 Units into the skin 2 (two) times daily before a meal. Per patients sliding scale: breakfast - 20-22 units,  supper 18-20 units   insulin regular 100 units/mL injection Commonly known as: NOVOLIN R Inject 18-24 Units into the skin 2 (two) times daily before a meal. Per patients sliding scale: breakfast 20-24 units, supper 18-20   levofloxacin 750 MG tablet Commonly known as: Levaquin Take 1 tablet (750 mg total) by mouth daily for 21 days.   LUTEIN PO Take 1 capsule by mouth 2 (two) times daily.   metoprolol tartrate 50 MG tablet Commonly known as: LOPRESSOR Take 1 tablet (50 mg total) by mouth 2 (two) times daily.   multivitamin with minerals Tabs tablet Take 1 tablet by mouth daily.   ondansetron 8 MG disintegrating tablet Commonly known as: ZOFRAN-ODT Take 8 mg by mouth every 8 (eight) hours as needed.   ondansetron 4 MG disintegrating tablet Commonly known as: ZOFRAN-ODT Take 1 tablet (4 mg total) by mouth every 8 (eight) hours as needed for nausea or vomiting.   SUMAtriptan 100 MG tablet Commonly  known as: IMITREX Take 100 mg by mouth every 2 (two) hours as needed.   Vitamin D (Ergocalciferol) 1.25 MG (50000 UNIT) Caps capsule Commonly known as: DRISDOL Take 50,000 Units by mouth once a week.        Allergies:  No Known Allergies  Family History: Family History  Problem Relation Age of Onset   Kidney disease Sister    Colon cancer Maternal Aunt     Social History:   reports that she has quit smoking. Her smoking use included cigarettes. She has never used smokeless tobacco. She reports that she does not drink alcohol and does not use drugs.  Physical Exam: BP 125/80   Pulse 71   Ht 5'  3" (1.6 m)   Wt 222 lb (100.7 kg)   LMP 05/17/2022 (Within Days)   BMI 39.33 kg/m   Constitutional:  Alert and oriented, no acute distress, nontoxic appearing HEENT: [place], AT Cardiovascular: No clubbing, cyanosis, or edema Respiratory: Normal respiratory effort, no increased work of breathing Skin: No rashes, bruises or suspicious lesions Neurologic: Grossly intact, no focal deficits, moving all 4 extremities Psychiatric: Normal mood and affect  Assessment & Plan:   1. History of recurrent UTI (urinary tract infection) We discussed the role of diabetes and increasing risk for recurrent UTIs and I encouraged her to continue tight glycemic control.  Possible correlation with sexual activity, may consider postcoital prophylaxis in the future.  We discussed starting cranberry, d-mannose, and lactobacillus supplements for UTI prevention.  I encouraged her to come see me for evaluation of acute onsets of symptoms so we can start gathering data about her culture patterns.  Will consider obtaining standard and atypical cultures in the future if she develops a pattern of symptoms with negative standard cultures.  2. Dysuria With reports of chronic dysuria at baseline and recurrent UTIs, I recommended cystoscopy for further evaluation of her bladder.  Return in about 2 weeks (around 06/17/2022) for Cysto with Dr. Diamantina Providence.  Debroah Loop, PA-C  [facility] [address], [place], [place] [address] [phone]

## 2022-06-06 DIAGNOSIS — I480 Paroxysmal atrial fibrillation: Secondary | ICD-10-CM

## 2022-06-13 ENCOUNTER — Other Ambulatory Visit
Admission: RE | Admit: 2022-06-13 | Discharge: 2022-06-13 | Disposition: A | Discharge status: Home / Self Care | Payer: Medicare Other | Source: Ambulatory Visit | Attending: Infectious Diseases | Admitting: Infectious Diseases

## 2022-06-13 ENCOUNTER — Encounter: Payer: Self-pay | Admitting: Infectious Diseases

## 2022-06-13 ENCOUNTER — Ambulatory Visit: Payer: Medicare Other | Attending: Infectious Diseases | Admitting: Infectious Diseases

## 2022-06-13 VITALS — BP 118/70 | HR 66 | Temp 97.1°F | Ht 63.0 in | Wt 216.0 lb

## 2022-06-13 DIAGNOSIS — Z87891 Personal history of nicotine dependence: Secondary | ICD-10-CM | POA: Insufficient documentation

## 2022-06-13 DIAGNOSIS — Z9049 Acquired absence of other specified parts of digestive tract: Secondary | ICD-10-CM | POA: Insufficient documentation

## 2022-06-13 DIAGNOSIS — E119 Type 2 diabetes mellitus without complications: Secondary | ICD-10-CM | POA: Insufficient documentation

## 2022-06-13 DIAGNOSIS — N151 Renal and perinephric abscess: Secondary | ICD-10-CM | POA: Insufficient documentation

## 2022-06-13 DIAGNOSIS — H5461 Unqualified visual loss, right eye, normal vision left eye: Secondary | ICD-10-CM | POA: Diagnosis not present

## 2022-06-13 DIAGNOSIS — I4892 Unspecified atrial flutter: Secondary | ICD-10-CM | POA: Insufficient documentation

## 2022-06-13 DIAGNOSIS — N390 Urinary tract infection, site not specified: Secondary | ICD-10-CM | POA: Insufficient documentation

## 2022-06-13 DIAGNOSIS — Z79899 Other long term (current) drug therapy: Secondary | ICD-10-CM | POA: Insufficient documentation

## 2022-06-13 DIAGNOSIS — Z794 Long term (current) use of insulin: Secondary | ICD-10-CM | POA: Insufficient documentation

## 2022-06-13 DIAGNOSIS — H409 Unspecified glaucoma: Secondary | ICD-10-CM | POA: Insufficient documentation

## 2022-06-13 LAB — COMPREHENSIVE METABOLIC PANEL
ALT: 16 U/L (ref 0–44)
AST: 23 U/L (ref 15–41)
Albumin: 4 g/dL (ref 3.5–5.0)
Alkaline Phosphatase: 79 U/L (ref 38–126)
Anion gap: 10 (ref 5–15)
BUN: 18 mg/dL (ref 6–20)
CO2: 23 mmol/L (ref 22–32)
Calcium: 9.1 mg/dL (ref 8.9–10.3)
Chloride: 100 mmol/L (ref 98–111)
Creatinine, Ser: 0.89 mg/dL (ref 0.44–1.00)
GFR, Estimated: >60 mL/min (ref >60)
Glucose, Bld: 230 mg/dL — ABNORMAL HIGH (ref 70–99)
Potassium: 4.1 mmol/L (ref 3.5–5.1)
Sodium: 133 mmol/L — ABNORMAL LOW (ref 135–145)
Total Bilirubin: 0.6 mg/dL (ref 0.3–1.2)
Total Protein: 8.3 g/dL — ABNORMAL HIGH (ref 6.5–8.1)

## 2022-06-13 LAB — CBC WITH DIFFERENTIAL/PLATELET
Abs Immature Granulocytes: 0.04 10*3/uL (ref 0.00–0.07)
Basophils Absolute: 0.1 10*3/uL (ref 0.0–0.1)
Basophils Relative: 2 %
Eosinophils Absolute: 0.3 10*3/uL (ref 0.0–0.5)
Eosinophils Relative: 4 %
HCT: 39.2 % (ref 36.0–46.0)
Hemoglobin: 13 g/dL (ref 12.0–15.0)
Immature Granulocytes: 1 %
Lymphocytes Relative: 21 %
Lymphs Abs: 1.8 10*3/uL (ref 0.7–4.0)
MCH: 29.8 pg (ref 26.0–34.0)
MCHC: 33.2 g/dL (ref 30.0–36.0)
MCV: 89.9 fL (ref 80.0–100.0)
Monocytes Absolute: 0.4 10*3/uL (ref 0.1–1.0)
Monocytes Relative: 5 %
Neutro Abs: 5.9 10*3/uL (ref 1.7–7.7)
Neutrophils Relative %: 67 %
Platelets: 346 10*3/uL (ref 150–400)
RBC: 4.36 MIL/uL (ref 3.87–5.11)
RDW: 13.7 % (ref 11.5–15.5)
WBC: 8.6 10*3/uL (ref 4.0–10.5)
nRBC: 0 % (ref 0.0–0.2)

## 2022-06-13 NOTE — Patient Instructions (Signed)
You are here for follow up for rt renal abscesses- you have 2 drains and also on levaquin until 06/21/22- you have a CT scheduled next week and IR appt following that. Depending on that will decide whether we need to continue antibiotic or not. Will do labs today

## 2022-06-13 NOTE — Progress Notes (Signed)
NAME: Katie Hampton  DOB: Dec 17, 1980  MRN: [id number]  Date/Time: 06/13/2022 11:16 AM   Subjective:   Follow-up after recent hospitalization between 05/24/2022 until 05/31/2022 for right renal abscesses secondary to Klebsiella.Katie Hampton is a 42 y.o. with a history of diabetes mellitus, peripheral neuropathy, blind left eye, glaucoma right eye, was recently in the hospital with right flank pain and dysuria.  She has had multiple pyelonephritis and since the past 2 years.  The recent episode that started in February did not respond to appropriate antibiotics given .  The urine culture was Klebsiella the end of February and she was placed on ciprofloxacin which it was sensitive to.  But as the symptoms were not improving and she was having right flank pain she came to the ED on 05/17/2022 and was prescribed Bactrim.  But she continued to have worsening flank pain and return to the ED on 05/24/2022 and was found to have multiple renal abscesses on the right side.  Drain was placed.  On the culture was again positive for Klebsiella.  Patient was initially given IV antibiotics and then on discharge on the 15th she was placed on Levaquin for 3 more weeks to continue until 06/21/2022. Patient is doing better.  She still has the 2 drains in the right kidney.  They are barely putting out any fluid. No fever or diarrhea or rash or joint pain She has an appointment for a CAT scan IR assessment next Tuesday.  Once she completes 4 weeks of antibiotics on 06/21/2022 she will not need any more antibiotics.   Past Medical History:  Diagnosis Date   Diabetes mellitus    Pyelonephritis     Past Surgical History:  Procedure Laterality Date   CHOLECYSTECTOMY     EYE SURGERY Bilateral    TUBAL LIGATION Bilateral     Social History   Socioeconomic History   Marital status: Married    Spouse name: Not on file   Number of children: Not on file   Years of education: Not on file   Highest education level: Not on  file  Occupational History   Not on file  Tobacco Use   Smoking status: Former    Types: Cigarettes   Smokeless tobacco: Never  Vaping Use   Vaping Use: Never used  Substance and Sexual Activity   Alcohol use: No   Drug use: Never   Sexual activity: Not on file  Other Topics Concern   Not on file  Social History Narrative   Not on file   Social Determinants of Health   Financial Resource Strain: Not on file  Food Insecurity: No Food Insecurity (05/24/2022)   Hunger Vital Sign    Worried About Running Out of Food in the Last Year: Never true    Ran Out of Food in the Last Year: Never true  Transportation Needs: No Transportation Needs (05/24/2022)   PRAPARE - Hydrologist (Medical): No    Lack of Transportation (Non-Medical): No  Physical Activity: Not on file  Stress: Not on file  Social Connections: Not on file  Intimate Partner Violence: Not At Risk (05/24/2022)   Humiliation, Afraid, Rape, and Kick questionnaire    Fear of Current or Ex-Partner: No    Emotionally Abused: No    Physically Abused: No    Sexually Abused: No    Family History  Problem Relation Age of Onset   Kidney disease Sister    Colon cancer Maternal Aunt  No Known Allergies I? Current Outpatient Medications  Medication Sig Dispense Refill   Aspirin-Caffeine (BAYER BACK & BODY PO) Take 1 tablet by mouth daily as needed (pain).     Azelastine HCl 137 MCG/SPRAY SOLN Place 2 sprays into both nostrils 2 (two) times daily.     fenofibrate micronized (LOFIBRA) 200 MG capsule Take 200 mg by mouth daily.     flecainide (TAMBOCOR) 100 MG tablet Take 1 tablet (100 mg total) by mouth every 12 (twelve) hours. 60 tablet 0   gabapentin (NEURONTIN) 600 MG tablet Take 600 mg by mouth at bedtime.     ibuprofen (ADVIL) 800 MG tablet Take 800 mg by mouth 3 (three) times daily as needed.     insulin NPH (HUMULIN N,NOVOLIN N) 100 UNIT/ML injection Inject 18-22 Units into the skin 2 (two)  times daily before a meal. Per patients sliding scale: breakfast - 20-22 units,  supper 18-20 units     insulin regular (NOVOLIN R,HUMULIN R) 100 units/mL injection Inject 18-24 Units into the skin 2 (two) times daily before a meal. Per patients sliding scale: breakfast 20-24 units, supper 18-20     levofloxacin (LEVAQUIN) 750 MG tablet Take 1 tablet (750 mg total) by mouth daily for 21 days. 21 tablet 0   LUTEIN PO Take 1 capsule by mouth 2 (two) times daily.     metoprolol tartrate (LOPRESSOR) 50 MG tablet Take 1 tablet (50 mg total) by mouth 2 (two) times daily. 60 tablet 0   Multiple Vitamin (MULTIVITAMIN WITH MINERALS) TABS Take 1 tablet by mouth daily.     Multiple Vitamins-Minerals (AIRBORNE PO) Take 1 tablet by mouth daily.     naproxen sodium (ALEVE) 220 MG tablet Take 880 mg by mouth 2 (two) times daily as needed (pain).     ondansetron (ZOFRAN-ODT) 4 MG disintegrating tablet Take 1 tablet (4 mg total) by mouth every 8 (eight) hours as needed for nausea or vomiting. 20 tablet 0   ondansetron (ZOFRAN-ODT) 8 MG disintegrating tablet Take 8 mg by mouth every 8 (eight) hours as needed.     SUMAtriptan (IMITREX) 100 MG tablet Take 100 mg by mouth every 2 (two) hours as needed.     Vitamin D, Ergocalciferol, (DRISDOL) 1.25 MG (50000 UNIT) CAPS capsule Take 50,000 Units by mouth once a week.     No current facility-administered medications for this visit.     Abtx:  Anti-infectives (From admission, onward)    None       REVIEW OF SYSTEMS:  Const: negative fever, negative chills, negative weight loss Eyes: negative diplopia or visual changes, negative eye pain ENT: negative coryza, negative sore throat Resp: negative cough, hemoptysis, dyspnea Cards: negative for chest pain, palpitations, lower extremity edema GU: Some discomfort in the right flank secondary to the 2 drains present GI: Negative for abdominal pain, diarrhea, bleeding, constipation Skin: negative for rash and  pruritus Heme: negative for easy bruising and gum/nose bleeding MS: negative for myalgias, arthralgias, back pain and muscle weakness Neurolo:negative for headaches, dizziness, vertigo, memory problems  Psych: negative for feelings of anxiety, depression  Endocrine:  diabetes Allergy/Immunology- negative for any medication or food allergies  Objective:  VITALS:  BP 118/70   Pulse 66   Temp (!) 97.1 F (36.2 C) (Temporal)   Ht 5\' 3"  (1.6 m)   Wt 216 lb (98 kg)   LMP 05/17/2022 (Within Days)   BMI 38.26 kg/m  LDA 2 right renal drains PHYSICAL EXAM:  General: Alert, cooperative,  no distress, appears stated age.  Head: Normocephalic, without obvious abnormality, atraumatic. Eyes: Strabismus  ENT Nares normal. No drainage or sinus tenderness. Lips, mucosa, and tongue normal. No Thrush Neck: Supple, symmetrical, no adenopathy, thyroid: non tender no carotid bruit and no JVD. Back: 2 right flank drains Lungs: Clear to auscultation bilaterally. No Wheezing or Rhonchi. No rales. Heart: Regular rate and rhythm, no murmur, rub or gallop. Abdomen: Soft, non-tender,not distended. Bowel sounds normal. No masses Extremities: atraumatic, no cyanosis. No edema. No clubbing Skin: No rashes or lesions. Or bruising Lymph: Cervical, supraclavicular normal. Neurologic: Grossly non-focal    Impression/Recommendation Complicated urinary tract infection with multiple right-sided renal abscesses status post renal drains.  Klebsiella in urine culture susceptible to Levaquin and patient is on 4 weeks of total antibiotics. Will do labs today to assess CBC and CMP. She is having the CAT scan next Tuesday and IR assessment for drain removal. She saw urologist on 06/03/2022 and she is to have a cystoscopy on 07/10/2022.  Diabetes mellitus needs good control to avoid further UTI and renal abscesses.  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  Partial blindness right eye due to glaucoma.  Discussed the management with the patient in great detail. Will follow her as needed? ? ? ___________________________________________________ Discussed with patient, requesting provider Note:  This document was prepared using Dragon voice recognition software and may include unintentional dictation errors.

## 2022-06-17 ENCOUNTER — Telehealth: Payer: Self-pay

## 2022-06-17 NOTE — Telephone Encounter (Signed)
Patient called, states that she started having severe pain in her right knee last Friday to the point where she can barely walk. Also reports joint pain in her left knee as well as both elbows and shoulders. She is wondering if this could be due to her new antibiotic.   Denies any other symptoms except headaches, which she reports she has all of the time. Will route to provider.   Beryle Flock, RN

## 2022-06-18 ENCOUNTER — Inpatient Hospital Stay: Admission: RE | Admit: 2022-06-18 | Payer: Medicare Other | Source: Ambulatory Visit

## 2022-06-21 ENCOUNTER — Ambulatory Visit
Admission: RE | Admit: 2022-06-21 | Discharge: 2022-06-21 | Disposition: A | Discharge status: Home / Self Care | Payer: Medicare Other | Source: Ambulatory Visit | Attending: Infectious Diseases | Admitting: Infectious Diseases

## 2022-06-21 ENCOUNTER — Ambulatory Visit
Admission: RE | Admit: 2022-06-21 | Discharge: 2022-06-21 | Disposition: A | Discharge status: Home / Self Care | Payer: Medicare Other | Source: Ambulatory Visit | Attending: Student | Admitting: Student

## 2022-06-21 DIAGNOSIS — N151 Renal and perinephric abscess: Secondary | ICD-10-CM

## 2022-06-21 MED ORDER — IOPAMIDOL (ISOVUE-300) INJECTION 61%
100.0000 mL | Freq: Once | INTRAVENOUS | Status: AC | PRN
  Administered 2022-06-21: 100 mL via INTRAVENOUS

## 2022-07-05 ENCOUNTER — Ambulatory Visit: Payer: Medicare Other | Attending: Medical | Admitting: Medical

## 2022-07-05 ENCOUNTER — Encounter: Payer: Self-pay | Admitting: Medical

## 2022-07-05 VITALS — BP 118/60 | HR 89 | Ht 63.0 in | Wt 218.2 lb

## 2022-07-05 DIAGNOSIS — I480 Paroxysmal atrial fibrillation: Secondary | ICD-10-CM | POA: Diagnosis not present

## 2022-07-05 NOTE — Patient Instructions (Signed)
Medication Instructions:  Your physician recommends that you continue on your current medications as directed. Please refer to the Current Medication list given to you today.  *If you need a refill on your cardiac medications before your next appointment, please call your pharmacy*   Lab Work: -None ordered If you have labs (blood work) drawn today and your tests are completely normal, you will receive your results only by: MyChart Message (if you have MyChart) OR A paper copy in the mail If you have any lab test that is abnormal or we need to change your treatment, we will call you to review the results.   Testing/Procedures: -None ordered   Follow-Up: At [facility], you and your health needs are our priority.  As part of our continuing mission to provide you with exceptional heart care, we have created designated Provider Care Teams.  These Care Teams include your primary Cardiologist (physician) and Advanced Practice Providers (APPs -  Physician Assistants and Nurse Practitioners) who all work together to provide you with the care you need, when you need it.  We recommend signing up for the patient portal called "MyChart".  Sign up information is provided on this After Visit Summary.  MyChart is used to connect with patients for Virtual Visits (Telemedicine).  Patients are able to view lab/test results, encounter notes, upcoming appointments, etc.  Non-urgent messages can be sent to your provider as well.   To learn more about what you can do with MyChart, go to [email or web address].    Your next appointment:   3 month(s)  Provider:   You may see one of the following Advanced Practice Providers on your designated Care Team:   Nicolasa Ducking, NP Eula Listen, PA-C Cadence Fransico Michael, PA-C Charlsie Quest, NP    Other Instructions -None

## 2022-07-05 NOTE — Progress Notes (Signed)
Cardiology Office Note:    Date:  07/05/2022   ID:  Katie Hampton, DOB 1980/09/09, MRN [id number]  PCP:  Darryll Capers, PA-C  [facility] Cardiologist:  None  [facility] Electrophysiologist:  None   Referring MD: Darryll Capers, PA-C   Chief Complaint: Hospital follow-up  History of Present Illness:    Katie Hampton is a 42 y.o. female with a hx of diabetes, pyelonephritis, recurrent UTIs and nephrolithiasis was recently admitted for new onset A-fib flutter.  Patient presented with severe sepsis due to right renal abscess found to have new onset A-fib/flutter.  Patient was started on metoprolol and flecainide.  CHA2DS2-VASc of 2, however she was not started on anticoagulation due to acute infection.  Echo showed LVEF 60 to 65%, normal RV SF.  Heart monitor showed predominantly normal rhythm, first-degree AV block,  less than 1% a flutter.  A flutter was detected within 45 seconds of symptomatic patient events.  Today, the patient is doing well. She feels normal again since hospitalization. She denies chest pain, SOB. She reports rare palpitations. No lower leg edema. No orthopnea or pnd. She does snore, but does not think she has sleep apnea.  Past Medical History:  Diagnosis Date   Diabetes mellitus    Pyelonephritis     Past Surgical History:  Procedure Laterality Date   CHOLECYSTECTOMY     EYE SURGERY Bilateral    TUBAL LIGATION Bilateral     Current Medications: Current Meds  Medication Sig   fenofibrate micronized (LOFIBRA) 200 MG capsule Take 200 mg by mouth daily.   flecainide (TAMBOCOR) 100 MG tablet Take 1 tablet (100 mg total) by mouth every 12 (twelve) hours.   gabapentin (NEURONTIN) 600 MG tablet Take 600 mg by mouth at bedtime.   ibuprofen (ADVIL) 800 MG tablet Take 800 mg by mouth 3 (three) times daily as needed.   insulin NPH (HUMULIN N,NOVOLIN N) 100 UNIT/ML injection Inject 18-22 Units into the skin 2 (two) times daily before a meal. Per  patients sliding scale: breakfast - 20-22 units,  supper 18-20 units   insulin regular (NOVOLIN R,HUMULIN R) 100 units/mL injection Inject 18-24 Units into the skin 2 (two) times daily before a meal. Per patients sliding scale: breakfast 20-24 units, supper 18-20   LUTEIN PO Take 1 capsule by mouth 2 (two) times daily.   metoprolol tartrate (LOPRESSOR) 50 MG tablet Take 1 tablet (50 mg total) by mouth 2 (two) times daily.   Multiple Vitamin (MULTIVITAMIN WITH MINERALS) TABS Take 1 tablet by mouth daily.   SUMAtriptan (IMITREX) 100 MG tablet Take 100 mg by mouth every 2 (two) hours as needed.   Vitamin D, Ergocalciferol, (DRISDOL) 1.25 MG (50000 UNIT) CAPS capsule Take 50,000 Units by mouth once a week.     Allergies:   Patient has no known allergies.   Social History   Socioeconomic History   Marital status: Married    Spouse name: Not on file   Number of children: Not on file   Years of education: Not on file   Highest education level: Not on file  Occupational History   Not on file  Tobacco Use   Smoking status: Former    Types: Cigarettes   Smokeless tobacco: Never  Vaping Use   Vaping Use: Never used  Substance and Sexual Activity   Alcohol use: No   Drug use: Never   Sexual activity: Not on file  Other Topics Concern   Not on file  Social History Narrative  Not on file   Social Determinants of Health   Financial Resource Strain: Not on file  Food Insecurity: No Food Insecurity (05/24/2022)   Hunger Vital Sign    Worried About Running Out of Food in the Last Year: Never true    Ran Out of Food in the Last Year: Never true  Transportation Needs: No Transportation Needs (05/24/2022)   PRAPARE - Administrator, Civil Service (Medical): No    Lack of Transportation (Non-Medical): No  Physical Activity: Not on file  Stress: Not on file  Social Connections: Not on file     Family History: The patient's family history includes Colon cancer in her maternal  aunt; Kidney disease in her sister.  ROS:   Please see the history of present illness.     All other systems reviewed and are negative.  EKGs/Labs/Other Studies Reviewed:    The following studies were reviewed today:  Heart monitor 05/2022 Patch Wear Time:  13 days and 19 hours (2024-03-21T19:08:31-0400 to 2024-04-04T15:05:39-0400)   Patient had a min HR of 43 bpm, max HR of 124 bpm, and avg HR of 68 bpm. Predominant underlying rhythm was Sinus Rhythm. First Degree AV Block was present. Atrial Flutter occurred (<1% burden), ranging from 52-124 bpm (avg of 96 bpm), the longest lasting  4 mins 31 secs with an avg rate of 75 bpm. Atrial Flutter may be possible Atrial Tachycardia with variable block. Atrial Flutter was detected within +/- 45 seconds of symptomatic patient event(s). Isolated SVEs were occasional (2.8%, 37690), SVE  Couplets were occasional (1.7%, 11182), and SVE Triplets were rare (<1.0%, 2347). No Isolated VEs, VE Couplets, or VE Triplets were present.  Echo 05/2022 1. Left ventricular ejection fraction, by estimation, is 60 to 65%. The  left ventricle has normal function. The left ventricle has no regional  wall motion abnormalities. Left ventricular diastolic parameters are  indeterminate.   2. Right ventricular systolic function is normal. The right ventricular  size is normal. There is normal pulmonary artery systolic pressure. The  estimated right ventricular systolic pressure is 26.0 mmHg.   3. The mitral valve is normal in structure. No evidence of mitral valve  regurgitation. No evidence of mitral stenosis.   4. The aortic valve has an indeterminant number of cusps. Aortic valve  regurgitation is not visualized. No aortic stenosis is present.   5. The inferior vena cava is normal in size with greater than 50%  respiratory variability, suggesting right atrial pressure of 3 mmHg.   6. There is a very small patent foramen ovale with minimal left to right  shunting  across the atrial septum.   EKG:  EKG is ordered today.  The ekg ordered today demonstrates NSR 89bpm, nonspecific T wave changes  Recent Labs: 05/24/2022: TSH 1.001 06/13/2022: ALT 16; BUN 18; Creatinine, Ser 0.89; Hemoglobin 13.0; Platelets 346; Potassium 4.1; Sodium 133  Recent Lipid Panel No results found for: "CHOL", "TRIG", "HDL", "CHOLHDL", "VLDL", "LDLCALC", "LDLDIRECT"   Physical Exam:    VS:  BP 118/60 (BP Location: Left Arm, Patient Position: Sitting, Cuff Size: Large)   Pulse 89   Ht  (1.6 m)   Wt 218 lb 4 oz (99 kg)   SpO2 98%   BMI 38.66 kg/m     Wt Readings from Last 3 Encounters:  07/05/22 218 lb 4 oz (99 kg)  06/13/22 216 lb (98 kg)  06/03/22 222 lb (100.7 kg)     GEN:  Well nourished, well  developed in no acute distress HEENT: Normal NECK: No JVD; No carotid bruits LYMPHATICS: No lymphadenopathy CARDIAC: RRR, no murmurs, rubs, gallops RESPIRATORY:  Clear to auscultation without rales, wheezing or rhonchi  ABDOMEN: Soft, non-tender, non-distended MUSCULOSKELETAL:  No edema; No deformity  SKIN: Warm and dry NEUROLOGIC:  Alert and oriented x 3 PSYCHIATRIC:  Normal affect   ASSESSMENT:    1. Paroxysmal atrial fibrillation    PLAN:    In order of problems listed above:  Paroxysmal Afib/flutter Recent admission for sepsis found to have new afib/flutter. CHADSVASC of 2 for female and diabetes, however EP saw and anticoagulation was not started given low CHADSVASC and her young age. She was started on metoprolol and flecainide. Heart monitor showed NSR with <1% aflutter longest lasting and 31 seconds. EKG with stable intervals today. She reports rare palpitations. No changes today. She has follow-up with EP scheduled.   Disposition: Follow up in 3 month(s) with MD/APP    Signed, Sacha Radloff David Stall, PA-C  07/05/2022 12:04 PM    [facility]

## 2022-07-10 ENCOUNTER — Other Ambulatory Visit: Payer: Medicare Other | Admitting: Urology

## 2022-07-17 ENCOUNTER — Ambulatory Visit: Payer: Medicare Other | Attending: Cardiology | Admitting: Cardiology

## 2022-07-17 ENCOUNTER — Encounter: Payer: Self-pay | Admitting: Cardiology

## 2022-07-17 VITALS — BP 120/84 | HR 90 | Ht 63.0 in | Wt 218.0 lb

## 2022-07-17 DIAGNOSIS — I483 Typical atrial flutter: Secondary | ICD-10-CM | POA: Diagnosis present

## 2022-07-17 DIAGNOSIS — Z79899 Other long term (current) drug therapy: Secondary | ICD-10-CM | POA: Insufficient documentation

## 2022-07-17 DIAGNOSIS — I480 Paroxysmal atrial fibrillation: Secondary | ICD-10-CM

## 2022-07-17 MED ORDER — FLECAINIDE ACETATE 100 MG PO TABS: 100.0000 mg | ORAL_TABLET | Freq: Two times a day (BID) | ORAL | 1 refills | Status: DC

## 2022-07-17 MED ORDER — METOPROLOL TARTRATE 50 MG PO TABS: 50.0000 mg | ORAL_TABLET | Freq: Two times a day (BID) | ORAL | 3 refills | Status: DC

## 2022-07-17 NOTE — Progress Notes (Signed)
Electrophysiology Office Note:    Date:  07/17/2022   ID:  Katie Hampton, DOB [birth year], MRN [id number]  [facility] Cardiologist:  None  [facility] Electrophysiologist:  Lanier Prude, MD   Referring MD: Darryll Capers, PA-C   Chief Complaint: Atrial fibrillation and flutter  History of Present Illness:    Katie Hampton is a 42 y.o. female who I am seeing today for an evaluation of atrial fibrillation and flutter at the request of Cadence Firth.  The patient was last seen by Cadence April 19.  The patient has a medical history that includes sepsis due to a right renal abscess, diabetes.  In the setting of her sepsis she was diagnosed with new onset atrial fibrillation and flutter.  She was started on metoprolol and flecainide.  Anticoagulation was not started given active infection.  A heart monitor showed less than 1% burden of atrial fibrillation/flutter.  Episodes corresponded to symptom triggers.  The patient was actually seen by Berton Mount on May 26, 2022. He recommended flecainide.  He did not recommend anticoagulation given her CHA2DS2-VASc and active infection.  She has recently run out of her flecainide and metoprolol and has not taken either medication for 2 weeks.  During this time she has noticed a significant increase in the burden of arrhythmia.  She is interested in a rhythm control strategy that avoids long-term exposure medications.       Their past medical, social and family history was reveiwed.   ROS:   Please see the history of present illness.    All other systems reviewed and are negative.  EKGs/Labs/Other Studies Reviewed:    The following studies were reviewed today:  July 14, 2022 ZIO monitor personally reviewed Less than 1% atrial flutter burden, longest lasting 4 minutes and 31 seconds  July 05, 2022 EKG shows sinus rhythm, left atrial enlargement, PR interval 176, QRS duration 82  May 26, 2022 echo EF 60 to 65%, RV  normal, no significant valvular abnormalities   Physical Exam:    VS:  BP 120/84   Pulse 90   Ht 5\' 3"  (1.6 m)   Wt 218 lb (98.9 kg)   SpO2 99%   BMI 38.62 kg/m     Wt Readings from Last 3 Encounters:  07/17/22 218 lb (98.9 kg)  07/05/22 218 lb 4 oz (99 kg)  06/13/22 216 lb (98 kg)     GEN:  Well nourished, well developed in no acute distress CARDIAC: RRR, no murmurs, rubs, gallops RESPIRATORY:  Clear to auscultation without rales, wheezing or rhonchi       ASSESSMENT AND PLAN:    1. Paroxysmal atrial fibrillation (HCC)   2. Typical atrial flutter (HCC)   3. Encounter for long-term (current) use of high-risk medication     #Atrial flutter #Atrial tachycardia #Atrial fibrillation Paroxysmal.  Diagnosed first in the setting of sepsis secondary to a renal abscess.  Was started on flecainide in addition to metoprolol.  Not on anticoagulation due to low CHA2DS2-VASc score.  Discussed treatment options for her atrial fibrillation including continued conservative management, continuing with antiarrhythmic therapy (flecainide) or proceeding with catheter ablation.  PR and QRS duration acceptable for continued flecainide use.  She would like to proceed with catheter ablation which I think is very reasonable.  She will need to start Eliquis for 4 weeks prior to any catheter ablation and we will plan to continue it for minimum of 3 to 6 months following the catheter ablation.  Discussed treatment  options today for AF including antiarrhythmic drug therapy and ablation. Discussed risks, recovery and likelihood of success with each treatment strategy. Risk, benefits, and alternatives to EP study and radiofrequency ablation for afib were discussed. These risks include but are not limited to stroke, bleeding, vascular damage, tamponade, perforation, damage to the esophagus, lungs, and other structures, pulmonary vein stenosis, worsening renal function, and death.  Discussed potential need  for repeat ablation procedures and antiarrhythmic drugs after an initial ablation. The patient understands these risk and wishes to proceed.  We will therefore proceed with catheter ablation at the next available time.  Carto, ICE, anesthesia are requested for the procedure.  Will also obtain CT PV protocol prior to the procedure to exclude LAA thrombus and further evaluate atrial anatomy.  #High risk medication use On flecainide.  PR/QRS duration acceptable for continued flecainide use.  I will bring her back in 7 to 10 days after restarting her flecainide for an exercise tolerance test.    Signed, Sheria Lang T. Lalla Brothers, MD, [facility], [facility] 07/17/2022 9:10 PM    Electrophysiology [facility]

## 2022-07-17 NOTE — Patient Instructions (Addendum)
Medication Instructions:  Your physician has recommended you make the following change in your medication: 1) RESTART taking your flecainide  2) START taking Eliquis 4 weeks prior to your ablation (on June 17th)  *If you need a refill on your cardiac medications before your next appointment, please call your pharmacy*  Lab Work: BMET and CBC prior to CT scan and ablation  You will get your lab work at [facility]) hospital.  Your lab work will be done at [facility] next to [employer].  These are walk in labs- you will not need an appointment and you do not need to be fasting.    Testing/Procedures: Your physician has requested that you have an exercise tolerance test in 7-10 days. For further information please visit [email or web address] Please also follow instruction sheet, as given.  Your physician has requested that you have cardiac CT. Cardiac computed tomography (CT) is a painless test that uses an x-ray machine to take clear, detailed pictures of your heart. For further information please visit [email or web address] Please follow instruction sheet as given. We will call you to schedule your CT scan. This will be done about one week prior to your ablation.   Your physician has recommended that you have an ablation. Catheter ablation is a medical procedure used to treat some cardiac arrhythmias (irregular heartbeats). During catheter ablation, a long, thin, flexible tube is put into a blood vessel in your groin (upper thigh), or neck. This tube is called an ablation catheter. It is then guided to your heart through the blood vessel. Radio frequency waves destroy small areas of heart tissue where abnormal heartbeats may cause an arrhythmia to start. Please see the instruction sheet given to you today. You are scheduled for Atrial Fibrillation Ablation on Monday, July 15 with Dr. Steffanie Dunn.Please arrive at the Main Entrance A at [facility]: [address]  [place], [place] [address] at 8:30 AM   Follow-Up: At [facility], you and your health needs are our priority.  As part of our continuing mission to provide you with exceptional heart care, we have created designated Provider Care Teams.  These Care Teams include your primary Cardiologist (physician) and Advanced Practice Providers (APPs -  Physician Assistants and Nurse Practitioners) who all work together to provide you with the care you need, when you need it.  Your next appointment:   We will call you to arrange your follow up

## 2022-07-23 ENCOUNTER — Ambulatory Visit (INDEPENDENT_AMBULATORY_CARE_PROVIDER_SITE_OTHER): Payer: Medicare Other | Admitting: Physician Assistant

## 2022-07-23 ENCOUNTER — Encounter: Payer: Self-pay | Admitting: Physician Assistant

## 2022-07-23 VITALS — BP 110/75 | HR 71 | Temp 98.1°F | Ht 63.0 in | Wt 217.0 lb

## 2022-07-23 DIAGNOSIS — R300 Dysuria: Secondary | ICD-10-CM | POA: Diagnosis not present

## 2022-07-23 DIAGNOSIS — N151 Renal and perinephric abscess: Secondary | ICD-10-CM | POA: Diagnosis not present

## 2022-07-23 LAB — URINALYSIS, COMPLETE
Bilirubin, UA: NEGATIVE
Ketones, UA: NEGATIVE
Leukocytes,UA: NEGATIVE
Nitrite, UA: NEGATIVE
Protein,UA: NEGATIVE
RBC, UA: NEGATIVE
Specific Gravity, UA: <1.005 — ABNORMAL LOW (ref 1.005–1.030)
Urobilinogen, Ur: 0.2 mg/dL (ref 0.2–1.0)
pH, UA: 5.5 (ref 5.0–7.5)

## 2022-07-23 LAB — MICROSCOPIC EXAMINATION: RBC, Urine: NONE SEEN /hpf (ref 0–2)

## 2022-07-23 LAB — BLADDER SCAN AMB NON-IMAGING: Scan Result: 24

## 2022-07-23 NOTE — Progress Notes (Signed)
07/23/2022 3:06 PM   Katie Hampton [birth year] [id number]  CC: Chief Complaint  Patient presents with   Dysuria   HPI: Katie Hampton is a 42 y.o. female with PMH DM1, chronic dysuria, and recurrent UTIs with a recent history of right renal abscess who presents today for evaluation of possible UTI.   Today she reports an approximate 5-day history of nausea and bladder burning both with and without urination.  She states the sensations are intermittent.  Today her symptoms have improved.  She took a home UTI test, which was faintly positive.  Notably, she has noticed worse bladder burning after alcohol consumption.  I previously recommended her to pursue cystoscopy, but she no showed that appointment.  She takes insulin for management of her type 1 diabetes and admits that her sugars have been higher over the past several days, however not high enough to be consistent with past infections.  She admits her diabetes is rather brittle and glucose control has been a challenge.  She reports stress incontinence but denies urge incontinence at baseline.  In-office UA today positive for 3+ glucose; urine microscopy with moderate bacteria.  PVR 24 mL.   PMH: Past Medical History:  Diagnosis Date   Diabetes mellitus    Pyelonephritis     Surgical History: Past Surgical History:  Procedure Laterality Date   CHOLECYSTECTOMY     EYE SURGERY Bilateral    TUBAL LIGATION Bilateral     Home Medications:  Allergies as of 07/23/2022   No Known Allergies      Medication List        Accurate as of Jul 23, 2022  3:06 PM. If you have any questions, ask your nurse or doctor.          BD Insulin Syringe U/F 31G X 5/16" 1 ML Misc Generic drug: Insulin Syringe-Needle U-100 USE 1 (ONE) SYRINGE 3-4 TIMES DAILY   fenofibrate micronized 200 MG capsule Commonly known as: LOFIBRA Take 200 mg by mouth daily.   flecainide 100 MG tablet Commonly known as: TAMBOCOR Take 1 tablet (100 mg  total) by mouth every 12 (twelve) hours.   gabapentin 600 MG tablet Commonly known as: NEURONTIN Take 600 mg by mouth at bedtime.   ibuprofen 800 MG tablet Commonly known as: ADVIL Take 800 mg by mouth 3 (three) times daily as needed.   insulin NPH Human 100 UNIT/ML injection Commonly known as: NOVOLIN N Inject 18-22 Units into the skin 2 (two) times daily before a meal. Per patients sliding scale: breakfast - 20-22 units,  supper 18-20 units   insulin regular 100 units/mL injection Commonly known as: NOVOLIN R Inject 18-24 Units into the skin 2 (two) times daily before a meal. Per patients sliding scale: breakfast 20-24 units, supper 18-20   LUTEIN PO Take 1 capsule by mouth 2 (two) times daily.   metoprolol tartrate 50 MG tablet Commonly known as: LOPRESSOR Take 1 tablet (50 mg total) by mouth 2 (two) times daily.   multivitamin with minerals Tabs tablet Take 1 tablet by mouth daily.   SUMAtriptan 100 MG tablet Commonly known as: IMITREX Take 100 mg by mouth every 2 (two) hours as needed.   Vitamin D (Ergocalciferol) 1.25 MG (50000 UNIT) Caps capsule Commonly known as: DRISDOL Take 50,000 Units by mouth once a week.        Allergies:  No Known Allergies  Family History: Family History  Problem Relation Age of Onset   Kidney disease Sister  Colon cancer Maternal Aunt     Social History:   reports that she has quit smoking. Her smoking use included cigarettes. She has never used smokeless tobacco. She reports that she does not drink alcohol and does not use drugs.  Physical Exam: BP 110/75   Pulse 71   Temp 98.1 F (36.7 C) (Oral)   Ht 5\' 3"  (1.6 m)   Wt 217 lb (98.4 kg)   BMI 38.44 kg/m   Constitutional:  Alert and oriented, no acute distress, nontoxic appearing HEENT: [place], AT Cardiovascular: No clubbing, cyanosis, or edema Respiratory: Normal respiratory effort, no increased work of breathing Skin: No rashes, bruises or suspicious  lesions Neurologic: Grossly intact, no focal deficits, moving all 4 extremities Psychiatric: Normal mood and affect  Laboratory Data: Results for orders placed or performed in visit on 07/23/22  Microscopic Examination   Urine  Result Value Ref Range   WBC, UA 0-5 0 - 5 /hpf   RBC, Urine None seen 0 - 2 /hpf   Epithelial Cells (non renal) 0-10 0 - 10 /hpf   Bacteria, UA Moderate (A) None seen/Few  Urinalysis, Complete  Result Value Ref Range   Specific Gravity, UA <1.005 (L) 1.005 - 1.030   pH, UA 5.5 5.0 - 7.5   Color, UA Yellow Yellow   Appearance Ur Clear Clear   Leukocytes,UA Negative Negative   Protein,UA Negative Negative/Trace   Glucose, UA 3+ (A) Negative   Ketones, UA Negative Negative   RBC, UA Negative Negative   Bilirubin, UA Negative Negative   Urobilinogen, Ur 0.2 0.2 - 1.0 mg/dL   Nitrite, UA Negative Negative   Microscopic Examination See below:   Bladder Scan (Post Void Residual) in office  Result Value Ref Range   Scan Result 24    Assessment & Plan:   1. Dysuria UA today is overall rather bland, low suspicion for UTI in the absence of pyuria.  Suspect bacteria is due to sample contamination.  We discussed that her symptoms appear to be multifactorial with an element of chronic dysuria, glucosuria, and bladder irritant intake.  We discussed the list of bladder irritants and I provided this information in her AVS today.  I recommended rescheduling cystoscopy and she agreed. - Urinalysis, Complete - Bladder Scan (Post Void Residual) in office - US RENAL; Future  2. Renal abscess, right With her history of recent renal abscess, I offered her renal ultrasound to rule this out, though patient for this is very low in the absence of fevers and with relatively bland UA. - US RENAL; Future  Return in 3 weeks (on 08/13/2022) for Will call with results, Cysto with Dr. Richardo Hanks.  Carman Ching, PA-C  [facility] [place] [address] [place], [place] [address] [phone]

## 2022-07-23 NOTE — Patient Instructions (Signed)
Common foods that can worsen bladder pain, pain with urination, or urinary urgency/frequency include:  Caffeine Alcohol Carbonated beverages Chocolate Spicy foods Acidic foods including citrus and tomatoes  

## 2022-07-26 ENCOUNTER — Telehealth: Payer: Self-pay | Admitting: Nurse Practitioner

## 2022-07-26 ENCOUNTER — Ambulatory Visit
Admission: RE | Admit: 2022-07-26 | Discharge: 2022-07-26 | Disposition: A | Discharge status: Home / Self Care | Payer: Medicare Other | Source: Ambulatory Visit | Attending: Cardiology | Admitting: Cardiology

## 2022-07-26 DIAGNOSIS — I483 Typical atrial flutter: Secondary | ICD-10-CM

## 2022-07-26 DIAGNOSIS — I480 Paroxysmal atrial fibrillation: Secondary | ICD-10-CM | POA: Diagnosis not present

## 2022-07-26 DIAGNOSIS — Z79899 Other long term (current) drug therapy: Secondary | ICD-10-CM | POA: Diagnosis not present

## 2022-07-26 LAB — EXERCISE TOLERANCE TEST
Duke Treadmill Score: 5
Estimated workload: 6.5
ST Depression (mm): 0 mm

## 2022-07-26 NOTE — Telephone Encounter (Signed)
   Patient underwent exercise treadmill test earlier today to evaluate PR and QRS intervals with activity on flecainide.  QRS increased from 120 ms to 160 ms with peak exercise.  Discussed with Dr. Lalla Brothers and contacted patient this afternoon.  Patient will discontinue flecainide therapy.  Cont metoprolol.  She will follow-up next week with Sherie Don, NP to discuss potential initiation of Multaq therapy.  Currently, PVI is planned in July.  Caller verbalized understanding and was grateful for the call back.  Nicolasa Ducking, NP 07/26/2022, 3:13 PM

## 2022-07-27 LAB — EXERCISE TOLERANCE TEST
Angina Index: 0
Exercise duration (min): 4 min
Exercise duration (sec): 40 s
MPHR: 180 {beats}/min
Peak HR: 157 {beats}/min
Percent HR: 87 %
Rest HR: 76 {beats}/min

## 2022-07-27 NOTE — Progress Notes (Unsigned)
Cardiology Office Note Date:  07/29/2022  Patient ID:  Katie Hampton, DOB [birth year], MRN [id number] PCP:  Darryll Capers, PA-C  Cardiologist:  None Electrophysiologist: Lanier Prude, MD    Chief Complaint: afib  History of Present Illness: Katie Hampton is a 42 y.o. female with PMH notable for Aflutter, Atach, AFib, T2DM; seen today for Lanier Prude, MD for acute visit due to abnormal ETT and flecainide use.   Was first diagnosed with A-fib while hospitalized due to sepsis from a right renal abscess.  Was started on flecainide and metoprolol at that time.  No anticoagulation due to low CHA2DS2-VASc and active infection.   Last seen by Dr. Lalla Brothers 07/2022.  She had run out of the medication and had noticed a significant increase in A-fib burden.  When she saw Dr. Lalla Brothers, she mentioned wanting to avoid long-term medication and was set up for an ablation procedure in July.  She was restarted on her flecainide and presented for an ETT last week.  Unfortunately she had significant increase in her QRS duration during the test, so flecainide was stopped.  She presents today to rediscuss AAD medications.  She continues to have palpitations, feels overwhelmed by all the quickly changing medications and conditions. She questions what happened during the ETT and what the plan is now.   AAD History: Flecainide - prolonged QRS  Past Medical History:  Diagnosis Date   Diabetes mellitus    Pyelonephritis     Past Surgical History:  Procedure Laterality Date   CHOLECYSTECTOMY     EYE SURGERY Bilateral    TUBAL LIGATION Bilateral     Current Outpatient Medications  Medication Instructions   BD INSULIN SYRINGE U/F 31G X 5/16" 1 ML MISC USE 1 (ONE) SYRINGE 3-4 TIMES DAILY   fenofibrate micronized (LOFIBRA) 200 mg, Oral, Daily   gabapentin (NEURONTIN) 600 mg, Oral, Daily at bedtime   ibuprofen (ADVIL) 800 mg, Oral, 3 times daily PRN   insulin NPH Human (NOVOLIN N) 18-22 Units,  Subcutaneous, 2 times daily before meals, Per patients sliding scale: breakfast - 20-22 units,  supper 18-20 units   insulin regular (NOVOLIN R) 18-24 Units, Subcutaneous, 2 times daily before meals, Per patients sliding scale: breakfast 20-24 units, supper 18-20   LUTEIN PO 1 capsule, Oral, 2 times daily   metoprolol tartrate (LOPRESSOR) 50 mg, Oral, 2 times daily   Multaq 400 mg, Oral, 2 times daily with meals   Multaq 400 mg, Oral, 2 times daily with meals   Multiple Vitamin (MULTIVITAMIN WITH MINERALS) TABS 1 tablet, Daily   SUMAtriptan (IMITREX) 100 mg, Oral, Every 2 hours PRN   Vitamin D (Ergocalciferol) (DRISDOL) 50,000 Units, Oral, Weekly    Social History:  The patient  reports that she has quit smoking. Her smoking use included cigarettes. She has never used smokeless tobacco. She reports current alcohol use. She reports that she does not use drugs.   Family History:  The patient's family history includes Colon cancer in her maternal aunt; Kidney disease in her sister.  ROS:  Please see the history of present illness. All other systems are reviewed and otherwise negative.   PHYSICAL EXAM:  VS:  BP 120/80 (BP Location: Left Arm, Patient Position: Sitting, Cuff Size: Normal)   Pulse 73   Ht 5\' 3"  (1.6 m)   Wt 217 lb (98.4 kg)   SpO2 99%   BMI 38.44 kg/m  BMI: Body mass index is 38.44 kg/m.  GEN- The patient is well  appearing, alert and oriented x 3 today.   Lungs- Clear to ausculation bilaterally, normal work of breathing.  Heart- Regular rate and rhythm, no murmurs, rubs or gallops Extremities- No peripheral edema, warm, dry  EKG is ordered. Personal review of EKG from today shows:  NSR at 73bpm  Recent Labs: 05/24/2022: TSH 1.001 06/13/2022: ALT 16; BUN 18; Creatinine, Ser 0.89; Hemoglobin 13.0; Platelets 346; Potassium 4.1; Sodium 133  No results found for requested labs within last 365 days.   CrCl cannot be calculated (Patient's most recent lab result is older than  the maximum 21 days allowed.).   Wt Readings from Last 3 Encounters:  07/29/22 217 lb (98.4 kg)  07/23/22 217 lb (98.4 kg)  07/17/22 218 lb (98.9 kg)     Additional studies reviewed include: Previous EP, cardiology notes.   Exercise treadmill study, 07/26/2022 Target heart rate achieved Increase in QRS at peak exercise with normalization in recovery  Long term monitor, 05/31/2022   Normal sinus rhythm predominantly ranging between 43 and 124 bpm with average HR 68 bpm   <1% Atrial flutter burden ranging from 52-124 bpm (avg 96 bpm), longest lasting 4 minute 31 seconds   No evidence of AFib or ventricular arrythmias   No evidence of AV blocks or pauses   4.5% PAC burden and 0% PVC burden   Patient triggered events correlate with AFlutter, NSR and PAC  TTE, 05/26/2022  1. Left ventricular ejection fraction, by estimation, is 60 to 65%. The left ventricle has normal function. The left ventricle has no regional wall motion abnormalities. Left ventricular diastolic parameters are indeterminate.   2. Right ventricular systolic function is normal. The right ventricular size is normal. There is normal pulmonary artery systolic pressure. The estimated right ventricular systolic pressure is 26.0 mmHg.   3. The mitral valve is normal in structure. No evidence of mitral valve regurgitation. No evidence of mitral stenosis.   4. The aortic valve has an indeterminant number of cusps. Aortic valve regurgitation is not visualized. No aortic stenosis is present.   5. The inferior vena cava is normal in size with greater than 50% respiratory variability, suggesting right atrial pressure of 3 mmHg.   6. There is a very small patent foramen ovale with minimal left to right shunting across the atrial septum.    ASSESSMENT AND PLAN:  #) parox AFib #) typical aflutter AF ablation scheduled for July 2024 Was well-maintained on flecainide, unfortunately has prolongation of QRS during ETT so stopped Continue  50mg  lopressor BID She is agreeable to starting multaq 400mg  BID, samples provided in office She will keep [place] updated on cost of rx  CHA2DS2-VASc Score = 2 [CHF History: 0, HTN History: 0, Diabetes History: 1, Stroke History: 0, Vascular Disease History: 0, Age Score: 0, Gender Score: 1].  Therefore, the patient's annual risk of stroke is 2.2 %. NOAC - not on d/t low chadsvasc    #) Anxiety about health conditions Reassurance and education provided on afib and why flecainide was stopped Encouraged her to reach out to PCP for further evaluation/treatments    Current medicines are reviewed at length with the patient today.   The patient does not have concerns regarding her medicines.  The following changes were made today:   START 400mg  multaq BID  Labs/ tests ordered today include:  Orders Placed This Encounter  Procedures   EKG 12-Lead     Disposition: Follow up with EP APP in  2-3 months    Signed,  Sherie Don, NP  07/29/22  3:01 PM  Electrophysiology [facility]

## 2022-07-29 ENCOUNTER — Encounter: Payer: Self-pay | Admitting: Cardiology

## 2022-07-29 ENCOUNTER — Other Ambulatory Visit: Payer: Self-pay | Admitting: Cardiology

## 2022-07-29 ENCOUNTER — Ambulatory Visit: Payer: Medicare Other | Attending: Cardiology | Admitting: Cardiology

## 2022-07-29 VITALS — BP 120/80 | HR 73 | Ht 63.0 in | Wt 217.0 lb

## 2022-07-29 DIAGNOSIS — R002 Palpitations: Secondary | ICD-10-CM | POA: Insufficient documentation

## 2022-07-29 DIAGNOSIS — I480 Paroxysmal atrial fibrillation: Secondary | ICD-10-CM | POA: Diagnosis present

## 2022-07-29 MED ORDER — MULTAQ 400 MG PO TABS: 400.0000 mg | ORAL_TABLET | Freq: Two times a day (BID) | ORAL | 0 refills | Status: DC

## 2022-07-29 MED ORDER — MULTAQ 400 MG PO TABS: 400.0000 mg | ORAL_TABLET | Freq: Two times a day (BID) | ORAL | 1 refills | Status: DC

## 2022-07-29 NOTE — Telephone Encounter (Signed)
This medication is non-formulary however the provider, Sherie Don, NP, said there is no equivalent to it therefore we are wondering if a prior auth could be done in this case? Please advise. Thank you!

## 2022-07-29 NOTE — Telephone Encounter (Signed)
Please advise alternative request due to medication non formulary.

## 2022-07-29 NOTE — Patient Instructions (Signed)
Medication Instructions:  START Multaq 400 mg by mouth twice a day  *If you need a refill on your cardiac medications before your next appointment, please call your pharmacy*   Lab Work: No labs ordered  If you have labs (blood work) drawn today and your tests are completely normal, you will receive your results only by: MyChart Message (if you have MyChart) OR A paper copy in the mail If you have any lab test that is abnormal or we need to change your treatment, we will call you to review the results.   Testing/Procedures: No testing ordered   Follow-Up: At [facility], you and your health needs are our priority.  As part of our continuing mission to provide you with exceptional heart care, we have created designated Provider Care Teams.  These Care Teams include your primary Cardiologist (physician) and Advanced Practice Providers (APPs -  Physician Assistants and Nurse Practitioners) who all work together to provide you with the care you need, when you need it.  We recommend signing up for the patient portal called "MyChart".  Sign up information is provided on this After Visit Summary.  MyChart is used to connect with patients for Virtual Visits (Telemedicine).  Patients are able to view lab/test results, encounter notes, upcoming appointments, etc.  Non-urgent messages can be sent to your provider as well.   To learn more about what you can do with MyChart, go to [email or web address].    Your next appointment:   2 month(s)  Provider:   Sherie Don, NP

## 2022-07-30 ENCOUNTER — Encounter: Payer: Self-pay | Admitting: Cardiology

## 2022-07-30 ENCOUNTER — Telehealth: Payer: Self-pay

## 2022-07-30 ENCOUNTER — Other Ambulatory Visit: Payer: Self-pay

## 2022-07-30 DIAGNOSIS — E109 Type 1 diabetes mellitus without complications: Secondary | ICD-10-CM | POA: Insufficient documentation

## 2022-07-30 MED ORDER — MULTAQ 400 MG PO TABS: 400.0000 mg | ORAL_TABLET | Freq: Two times a day (BID) | ORAL | 0 refills | Status: DC

## 2022-07-30 MED ORDER — MULTAQ 400 MG PO TABS: 400.0000 mg | ORAL_TABLET | Freq: Two times a day (BID) | ORAL | 2 refills | Status: DC

## 2022-07-30 NOTE — Telephone Encounter (Signed)
Left message on cell voicemail requesting to have patient call me back to discuss further steps to get Multaq through [facility]. Also want to let her know we have a few more sample packs for her to pick up.

## 2022-07-30 NOTE — Telephone Encounter (Signed)
Spoke with patient and informed her that she may stop by the office to pick up samples and a free 30 day trial coupon. Advised her it may not work with her insurance but she is willing to take it to [facility] in [place] to try it. Patient also agreed to have a prescription sent to [facility]. Informed her that they will call her for further information/payment. She is aware that the monthly cost could be up to $150 but they do guarantee the lowest pricing.

## 2022-07-30 NOTE — Addendum Note (Signed)
Addended by: Thayer Headings, Gerron Guidotti L on: 07/30/2022 12:33 PM   Modules accepted: Orders

## 2022-08-02 ENCOUNTER — Ambulatory Visit
Admission: RE | Admit: 2022-08-02 | Discharge: 2022-08-02 | Disposition: A | Discharge status: Home / Self Care | Payer: Medicare Other | Source: Ambulatory Visit | Attending: Physician Assistant | Admitting: Physician Assistant

## 2022-08-02 DIAGNOSIS — R300 Dysuria: Secondary | ICD-10-CM

## 2022-08-02 DIAGNOSIS — N151 Renal and perinephric abscess: Secondary | ICD-10-CM | POA: Diagnosis present

## 2022-08-08 NOTE — Telephone Encounter (Signed)
Sherie Don, NP  You18 hours ago (2:50 PM)    Vision changes are a very very VERY rare possible side effect of Multaq. Before stopping it, I'd like her to see ophtho before assuming it's the multaq.    Spoke with patient and advised her of Suzann's recommendations. She states she has had hemorrhages in her eyes in the past and is a Type 1 diabetic so she was just concerned about the possible side effects. Patient expressed gratitude for the call.

## 2022-08-15 ENCOUNTER — Telehealth: Payer: Self-pay | Admitting: Cardiology

## 2022-08-15 NOTE — Telephone Encounter (Signed)
Patient states yesterday she missed a call and the voicemail advised to cancel one of her appointments and to move one up, but she is unsure which appointment needs to be cancelled. Please advise.

## 2022-08-16 NOTE — Telephone Encounter (Signed)
Not sure who originally called patient, however she does need to reschedule her ablation now. I called patient and left a message for her to call back.

## 2022-08-19 NOTE — Telephone Encounter (Signed)
Patient was returning call. Please advise ?

## 2022-08-19 NOTE — Telephone Encounter (Signed)
LM for pt to call back. We need to reschedule her ablation.

## 2022-08-19 NOTE — Telephone Encounter (Signed)
Pt's procedure has been moved to 11/22/22.. CT and labs have been rescheduled.   Instructions sent via MyChart

## 2022-08-22 ENCOUNTER — Ambulatory Visit (INDEPENDENT_AMBULATORY_CARE_PROVIDER_SITE_OTHER): Payer: Medicare Other | Admitting: Urology

## 2022-08-22 VITALS — BP 127/78 | HR 93 | Ht 63.0 in | Wt 217.0 lb

## 2022-08-22 DIAGNOSIS — Z87440 Personal history of urinary (tract) infections: Secondary | ICD-10-CM | POA: Diagnosis not present

## 2022-08-22 DIAGNOSIS — R102 Pelvic and perineal pain: Secondary | ICD-10-CM

## 2022-08-22 DIAGNOSIS — E109 Type 1 diabetes mellitus without complications: Secondary | ICD-10-CM | POA: Diagnosis not present

## 2022-08-22 DIAGNOSIS — R300 Dysuria: Secondary | ICD-10-CM | POA: Diagnosis not present

## 2022-08-22 MED ORDER — SULFAMETHOXAZOLE-TRIMETHOPRIM 800-160 MG PO TABS
1.0000 | ORAL_TABLET | Freq: Once | ORAL | Status: AC
Start: 2022-08-22 — End: 2022-08-22
  Administered 2022-08-22: 1 via ORAL

## 2022-08-22 MED ORDER — AMITRIPTYLINE HCL 25 MG PO TABS: 25.0000 mg | ORAL_TABLET | Freq: Every day | ORAL | 11 refills | Status: DC

## 2022-08-22 MED ORDER — LIDOCAINE HCL URETHRAL/MUCOSAL 2 % EX GEL
1.0000 | Freq: Once | CUTANEOUS | Status: AC
Start: 2022-08-22 — End: 2022-08-22
  Administered 2022-08-22: 1 via URETHRAL

## 2022-08-22 NOTE — Patient Instructions (Signed)
Interstitial Cystitis  Interstitial cystitis is inflammation of the bladder. This condition is also known as painful bladder syndrome. This may cause pain in the bladder area as well as a frequent and urgent need to urinate. The bladder is an organ that stores urine after the urine is made in the kidneys. The severity of interstitial cystitis can vary from person to person. You may have flare-ups, and then your symptoms may go away for a while. For many people, it becomes a long-term (chronic) problem. What are the causes? The cause of this condition is not known. What increases the risk? The following factors may make you more likely to develop this condition: Being female. Having fibromyalgia. Having irritable bowel syndrome (IBS). Having endometriosis. Having chronic fatigue syndrome. This condition may be aggravated by: Stress. Smoking. Spicy foods. What are the signs or symptoms? Symptoms of interstitial cystitis vary, and they can change over time. Symptoms may include: Discomfort or pain in the bladder area, which is in the lower abdomen. Pain can range from mild to severe. The pain may change in intensity as the bladder fills with urine or as it empties. Pain in the pelvic area, between the hip bones. A constant urge to urinate. Frequent urination. Pain during urination. Pain during sex. Blood in the urine. Feeling tired (fatigue). For women, symptoms often get worse during menstruation. How is this diagnosed? This condition is diagnosed based on your symptoms, your medical history, and a physical exam. Your health care provider may need to rule out other conditions and may order other tests, such as: Urine tests. Cystoscopy. For this test, a tool similar to a very thin telescope is used to look into your bladder. Biopsy. This involves taking a sample of tissue from the bladder to be examined under a microscope. How is this treated? There is no cure for this condition, but  treatment can help you control your symptoms. Work closely with your health care provider to find the most effective treatments for you. Treatment options may include: Medicines to relieve pain and reduce how often you feel the need to urinate. This treatment may include: A procedure where a small amount of medicine that eases irritation is put inside your bladder through a catheter (bladder instillation). Lifestyle changes, such as changing your diet or taking steps to control stress. Physical therapy. This may include: Exercises to help relax the pelvic floor muscles. Massage to relax tight muscles (myofascial release). Learning ways to control when you urinate (bladder training). Using a device that provides electrical stimulation to your nerves, which can relieve pain (neuromodulation therapy). The device is placed on your back, where it blocks the nerves that cause you to feel pain in your bladder area. A procedure that stretches your bladder by filling it with air or fluid (hydrodistention). Surgery. This is rare. It is only done for extreme cases, if other treatments do not help. Follow these instructions at home: Lifestyle Learn and practice relaxation techniques, such as deep breathing and muscle relaxation. Get care for your body and mental well-being, such as: Cognitive behavioral therapy (CBT). This therapy changes the way you think or act in response to different situations. This may improve how you feel. Seeing a mental health therapist to evaluate and treat depression, if necessary. Work with your health care provider on other ways to manage pain. Acupuncture may be helpful. Avoid drinking alcohol. Do not use any products that contain nicotine or tobacco. These products include cigarettes, chewing tobacco, and vaping devices, such  as e-cigarettes. If you need help quitting, ask your health care provider. Eating and drinking Make dietary changes as recommended by your health care  provider. You may need to avoid: Spicy foods. Foods that contain a lot of potassium. Limit your intake of drinks that increase your urge to urinate. These include alcohol and caffeinated drinks like soda, coffee, and tea. Bladder training  Use bladder training techniques as directed. Techniques may include: Urinating at scheduled times. Training yourself to delay urination. Keep a bladder diary. Write down the times you urinate and any symptoms that you have. This can help you find out which foods, liquids, or activities make your symptoms worse. Use your bladder diary to schedule bathroom trips. If you are away from home, plan to be near a bathroom at each of your scheduled times. Make sure that you urinate just before you leave the house and just before you go to bed. General instructions Take over-the-counter and prescription medicines only as told by your health care provider. Try a warm or cool compress over your bladder for comfort. Avoid wearing tight clothing. Do exercises to relax your pelvic floor muscles as told by your physical therapist. Keep all follow-up visits. This is important. Where to find more information To find more information or a support group near you, visit: [employer]: urologyhealth.org Interstitial Cystitis Association: [employer] Contact a health care provider if you have: Symptoms that do not get better with treatment. Pain or discomfort that gets worse. More frequent urges to urinate. A fever. Get help right away if: You have no control over when you urinate. Summary Interstitial cystitis is inflammation of the bladder. This condition may cause pain in the bladder area as well as a frequent and urgent need to urinate. You may have flare-ups of the condition, and then it may go away for a while. For many people, it becomes a long-term (chronic) problem. There is no cure for interstitial cystitis, but treatment methods are available to  control your symptoms. This information is not intended to replace advice given to you by your health care provider. Make sure you discuss any questions you have with your health care provider. Document Revised: 10/02/2019 Document Reviewed: 10/08/2019 Elsevier Patient Education  2024 [employer].

## 2022-08-22 NOTE — Progress Notes (Signed)
Cystoscopy Procedure Note:  Indication: Recurrent UTI  42 year old female with DM1 who reports 2 years of recurrent UTIs, hospitalized in March 2024 with pyelonephritis and right renal abscess requiring drain with interventional radiology.  Bactrim given for prophylaxis  After informed consent and discussion of the procedure and its risks, Katie Hampton was positioned and prepped in the standard fashion. Cystoscopy was performed with a flexible cystoscope. The urethra, bladder neck and entire bladder was visualized in a standard fashion.  The ureteral orifices were visualized in their normal location and orientation.  Bladder mucosa grossly normal throughout, no abnormalities on retroflexion  Imaging: Recent renal ultrasound 08/02/2022 benign CT 06/21/2022 with completely drained right renal abscess, small 2 mm right midpole stone, no hydronephrosis  Findings: Normal cystoscopy  -----------------------------------------------------------------  Assessment and Plan: 42 year old female with about 2 years of pelvic pain and dysuria of unclear etiology.  Recent hospitalization for right-sided pyelonephritis and renal abscess requiring drain with IR, resolved on recent renal ultrasound.  She feels alcohol may exacerbate her pelvic pain and urinary symptoms.  We discussed the complexities of pelvic pain and possible range of etiologies including pelvic floor dysfunction, chronic bladder pain syndrome, and interstitial cystitis.  We reviewed the AUA guidelines that recommend an algorithmic approach to treatment for these patients, and that a trial of different medications and strategies is sometimes needed to find the approach that works best for each patient's unique situation.  I reinforced the importance of stress management, relaxation, avoiding triggers, and pain management in the approach to pelvic pain.  Trial of amitriptyline 25 mg nightly for possible IC, pelvic floor PT referral  placed Continue cranberry tablets for UTI prevention If recurrent infections, could consider right ureteroscopy and laser lithotripsy of small right renal stone RTC 6 to 8 weeks symptom check with PA   Legrand Rams, MD 08/22/2022

## 2022-08-28 ENCOUNTER — Telehealth: Payer: Self-pay

## 2022-08-28 ENCOUNTER — Other Ambulatory Visit: Payer: Self-pay

## 2022-08-28 NOTE — Telephone Encounter (Signed)
*  Cardio  PA request received for Multaq 400MG  tablets  PA submitted to [employer] Part D via CMM and is pending additional questions/determination  Key: [id number]  Previously on flecanide

## 2022-08-28 NOTE — Telephone Encounter (Signed)
PA is being submitted and will be updated in additional encounter created.

## 2022-08-29 ENCOUNTER — Other Ambulatory Visit (HOSPITAL_COMMUNITY): Payer: Self-pay

## 2022-08-29 NOTE — Telephone Encounter (Signed)
Caller stated they will need further information regarding patient's Multaq prior authorization request.  Case Ref# [id number] will need a response by 6/14.

## 2022-08-29 NOTE — Telephone Encounter (Signed)
Addt info has been completed and faxed back to the pts plan

## 2022-09-04 NOTE — Telephone Encounter (Signed)
Any news on this one?

## 2022-09-11 ENCOUNTER — Other Ambulatory Visit (HOSPITAL_COMMUNITY): Payer: Self-pay

## 2022-09-11 ENCOUNTER — Telehealth: Payer: Self-pay

## 2022-09-11 NOTE — Telephone Encounter (Signed)
   Appeal letter sent via fax/email. Awaiting determination

## 2022-09-14 ENCOUNTER — Other Ambulatory Visit: Payer: Self-pay | Admitting: Urology

## 2022-09-16 ENCOUNTER — Telehealth: Payer: Self-pay | Admitting: Cardiology

## 2022-09-16 NOTE — Telephone Encounter (Signed)
Pt c/o medication issue:  1. Name of Medication:   dronedarone (MULTAQ) 400 MG tablet    2. How are you currently taking this medication (dosage and times per day)? Take 1 tablet (400 mg total) by mouth 2 (two) times daily with a meal.   3. Are you having a reaction (difficulty breathing--STAT)? No  4. What is your medication issue? Office calling to make provider aware that Prior Auth form will be faxed today. Please sign and fax back. Please asdvise

## 2022-09-17 ENCOUNTER — Telehealth: Payer: Self-pay | Admitting: Cardiology

## 2022-09-17 NOTE — Telephone Encounter (Signed)
I think this was sent to me in error

## 2022-09-17 NOTE — Telephone Encounter (Signed)
Spoke to patient and informed her that [place], Katie Hampton, CPhT noted the P/a has been approved until 12.31.24. Informed patient the medication is mailed to the provider's office and then the office would reach out to her to pick it up. Patient was satisfied with the response.

## 2022-09-17 NOTE — Telephone Encounter (Signed)
Additional information was requested from the pts plan. This has already been completed and we are currently awaiting a determination

## 2022-09-17 NOTE — Telephone Encounter (Signed)
Follow Up:     Patient is calling to check on the status of her paper work for her Multaq.

## 2022-09-17 NOTE — Telephone Encounter (Signed)
P/a has been approved until 12.31.24

## 2022-09-17 NOTE — Telephone Encounter (Signed)
Thanks for the update

## 2022-09-23 ENCOUNTER — Other Ambulatory Visit (HOSPITAL_COMMUNITY): Payer: Medicare Other

## 2022-09-23 ENCOUNTER — Other Ambulatory Visit: Payer: Medicare Other

## 2022-09-26 ENCOUNTER — Telehealth: Payer: Self-pay | Admitting: Cardiology

## 2022-09-26 NOTE — Telephone Encounter (Signed)
Pt c/o medication issue:  1. Name of Medication:   dronedarone (MULTAQ) 400 MG tablet    2. How are you currently taking this medication (dosage and times per day)?   Take 1 tablet (400 mg total) by mouth 2 (two) times daily with a meal.    3. Are you having a reaction (difficulty breathing--STAT)? No  4. What is your medication issue? Pt calling to f/u to see if medication has been delivered to our office for her to pickup (see 7/2 note). Pt states that she took her last tablet yesterday. Please advise

## 2022-09-26 NOTE — Progress Notes (Signed)
Cardiology Office Note Date:  09/27/2022  Patient ID:  Katie Hampton, DOB Dec 02, 1980, MRN [id number] PCP:  [facility]  Cardiologist:  None Electrophysiologist: Lanier Prude, MD    Chief Complaint: afib  History of Present Illness: Katie Hampton is a 42 y.o. female with PMH notable for Aflutter, Atach, AFib, T2DM; seen today for Lanier Prude, MD for routine EP follow-up.  She was first diagnosed with A-fib 05/2022 while hospitalized due to sepsis from a right renal abscess.  Was started on flecainide and metoprolol at that time.  No anticoagulation due to low CHA2DS2-VASc and active infection.   Last seen by Dr. Lalla Brothers 07/2022.  She had run out of the medication and had noticed a significant increase in A-fib burden.  She was scheduled for an ablation and restarted flecainide. During ETT, she had significant increase in her QRS duration and so flecainide was stopped.  I saw her in follow-up 07/2022, and started multaq to avoid her exposure to amiodarone given young age.   On follow-up today, patient has run out of multaq about 2 days ago. [facility] did not have the medication in stock and so it is being ordered. She is pretty sure she converted to afib/flutter yesterday. Has palpitations, feels dizzy, with SOB. Has noticed that she gets fatigued much quicker than she used to.  Prior to running out of multaq, she was feeling well.   She denies increased edema, good appetite.   AAD History: Flecainide - prolonged QRS Multaq  Past Medical History:  Diagnosis Date   Diabetes mellitus    Pyelonephritis     Past Surgical History:  Procedure Laterality Date   CHOLECYSTECTOMY     EYE SURGERY Bilateral    TUBAL LIGATION Bilateral     Current Outpatient Medications  Medication Instructions   amitriptyline (ELAVIL) 25 mg, Oral, Daily at bedtime   apixaban (ELIQUIS) 5 mg, Oral, 2 times daily   BD INSULIN SYRINGE U/F 31G X 5/16" 1 ML MISC USE  1 (ONE) SYRINGE 3-4 TIMES DAILY   fenofibrate micronized (LOFIBRA) 200 mg, Oral, Daily   gabapentin (NEURONTIN) 600 mg, Oral, Daily at bedtime   ibuprofen (ADVIL) 800 mg, Oral, 3 times daily PRN   insulin NPH Human (NOVOLIN N) 18-22 Units, Subcutaneous, 2 times daily before meals, Per patients sliding scale: breakfast - 20-22 units,  supper 18-20 units   insulin regular (NOVOLIN R) 18-24 Units, Subcutaneous, 2 times daily before meals, Per patients sliding scale: breakfast 20-24 units, supper 18-20   LUTEIN PO 1 capsule, Oral, 2 times daily   metoprolol tartrate (LOPRESSOR) 50 mg, Oral, 2 times daily   Multaq 400 mg, Oral, 2 times daily with meals   Multiple Vitamin (MULTIVITAMIN WITH MINERALS) TABS 1 tablet, Daily   SUMAtriptan (IMITREX) 100 mg, Oral, Every 2 hours PRN   Vitamin D (Ergocalciferol) (DRISDOL) 50,000 Units, Oral, Weekly    Social History:  The patient  reports that she has quit smoking. Her smoking use included cigarettes. She has never used smokeless tobacco. She reports current alcohol use. She reports that she does not use drugs.   Family History:  The patient's family history includes Colon cancer in her maternal aunt; Kidney disease in her sister.  ROS:  Please see the history of present illness. All other systems are reviewed and otherwise negative.   PHYSICAL EXAM:  VS:  BP 104/78 (BP Location: Left Arm, Patient Position: Sitting, Cuff Size: Large)   Pulse (!) 139  Ht 5\' 3"  (1.6 m)   Wt 221 lb (100.2 kg)   SpO2 99%   BMI 39.15 kg/m  BMI: Body mass index is 39.15 kg/m.  GEN- The patient is well appearing, alert and oriented x 3 today.   Lungs- Clear to ausculation bilaterally, normal work of breathing.  Heart- Irregularly irregular rate and rhythm, no murmurs, rubs or gallops Extremities- No peripheral edema, warm, dry  EKG is ordered. Personal review of EKG from today shows: EKG Interpretation Date/Time:  Friday September 27 2022 14:04:10 EDT Ventricular Rate:   139 PR Interval:    QRS Duration:  78 QT Interval:  272 QTC Calculation: 413 R Axis:   15  Text Interpretation: typical counterclokwise Atrial flutter with variable A-V block ST & T wave abnormality, consider anterolateral ischemia When compared with ECG of 24-May-2022 17:39, ST now depressed in Lateral leads T wave inversion more evident in Anterolateral leads Confirmed by Sherie Don [phone]) on 09/27/2022 4:21:31 PM    Recent Labs: 05/24/2022: TSH 1.001 06/13/2022: ALT 16; BUN 18; Creatinine, Ser 0.89; Potassium 4.1; Sodium 133 09/27/2022: Hemoglobin 14.9; Platelets 345  No results found for requested labs within last 365 days.   CrCl cannot be calculated (Patient's most recent lab result is older than the maximum 21 days allowed.).   Wt Readings from Last 3 Encounters:  09/27/22 221 lb (100.2 kg)  08/22/22 217 lb (98.4 kg)  07/29/22 217 lb (98.4 kg)     Additional studies reviewed include: Previous EP, cardiology notes.   Exercise treadmill study, 07/26/2022 Target heart rate achieved Increase in QRS at peak exercise with normalization in recovery  Long term monitor, 05/31/2022   Normal sinus rhythm predominantly ranging between 43 and 124 bpm with average HR 68 bpm   <1% Atrial flutter burden ranging from 52-124 bpm (avg 96 bpm), longest lasting 4 minute 31 seconds   No evidence of AFib or ventricular arrythmias   No evidence of AV blocks or pauses   4.5% PAC burden and 0% PVC burden   Patient triggered events correlate with AFlutter, NSR and PAC  TTE, 05/26/2022  1. Left ventricular ejection fraction, by estimation, is 60 to 65%. The left ventricle has normal function. The left ventricle has no regional wall motion abnormalities. Left ventricular diastolic parameters are indeterminate.   2. Right ventricular systolic function is normal. The right ventricular size is normal. There is normal pulmonary artery systolic pressure. The estimated right ventricular systolic pressure  is 26.0 mmHg.   3. The mitral valve is normal in structure. No evidence of mitral valve regurgitation. No evidence of mitral stenosis.   4. The aortic valve has an indeterminant number of cusps. Aortic valve regurgitation is not visualized. No aortic stenosis is present.   5. The inferior vena cava is normal in size with greater than 50% respiratory variability, suggesting right atrial pressure of 3 mmHg.   6. There is a very small patent foramen ovale with minimal left to right shunting across the atrial septum.    ASSESSMENT AND PLAN:  #) parox AFib #) typical aflutter AF ablation scheduled w Dr.Lambert Tolerating multaq well, has ran out of medication 2 days ago and unable to obtain refill Continue lopressor 50mg  BID Patient feels confident she converted to arrhythmia yesterday Given palpitations with SOB and chest discomfort, will proceed with TEE/DCCV at next available (in 3 days). Discussed risks/benefits, patient verbalized understanding and wished to proceed. Initiate eliquis 5mg  BID today, discussed patient to continue anti-coag until 4  weeks post DCCV. Will send multaq to [facility] pharmacy, who have confirmed they have mediation in stock.  ER precautions reviewed  Informed Consent   Shared Decision Making/Informed Consent The risks [stroke, cardiac arrhythmias rarely resulting in the need for a temporary or permanent pacemaker, skin irritation or burns, esophageal damage, perforation (1:10,000 risk), bleeding, pharyngeal hematoma as well as other potential complications associated with conscious sedation including aspiration, arrhythmia, respiratory failure and death], benefits (treatment guidance, restoration of normal sinus rhythm, diagnostic support) and alternatives of a transesophageal echocardiogram guided cardioversion were discussed in detail with Ms. Curless and she is willing to proceed.      Current medicines are reviewed at length with the patient today.   The patient  does not have concerns regarding her medicines.  The following changes were made today:   Restart 400mg  multaq BID START 5mg  eliquis BID  Labs/ tests ordered today include:  Orders Placed This Encounter  Procedures   CBC   Basic metabolic panel   EKG 12-Lead     Disposition: Follow up with EP APP in as usual post procedure   Signed, Sherie Don, NP  09/27/22  4:21 PM  Electrophysiology [facility]

## 2022-09-26 NOTE — Telephone Encounter (Signed)
Called patient and left detailed message on voicemail stating that the medication will be at her pharmacy tomorrow 09/27/22 to pick up

## 2022-09-26 NOTE — H&P (View-Only) (Signed)
 Cardiology Office Note Date:  09/27/2022  Patient ID:  Katie Hampton, DOB Dec 02, 1980, MRN [id number] PCP:  [facility]  Cardiologist:  None Electrophysiologist: Lanier Prude, MD    Chief Complaint: afib  History of Present Illness: Katie Hampton is a 42 y.o. female with PMH notable for Aflutter, Atach, AFib, T2DM; seen today for Lanier Prude, MD for routine EP follow-up.  She was first diagnosed with A-fib 05/2022 while hospitalized due to sepsis from a right renal abscess.  Was started on flecainide and metoprolol at that time.  No anticoagulation due to low CHA2DS2-VASc and active infection.   Last seen by Dr. Lalla Brothers 07/2022.  She had run out of the medication and had noticed a significant increase in A-fib burden.  She was scheduled for an ablation and restarted flecainide. During ETT, she had significant increase in her QRS duration and so flecainide was stopped.  I saw her in follow-up 07/2022, and started multaq to avoid her exposure to amiodarone given young age.   On follow-up today, patient has run out of multaq about 2 days ago. [facility] not [facility] in [facility] and so it is being ordered. She is pretty sure she converted to afib/flutter yesterday. Has palpitations, feels dizzy, with SOB. Has noticed that she gets fatigued much quicker than she used to.  Prior to running out of multaq, she was feeling well.   She denies increased edema, good appetite.   AAD History: Flecainide - prolonged QRS Multaq  Past Medical History:  Diagnosis Date   Diabetes mellitus    Pyelonephritis     Past Surgical History:  Procedure Laterality Date   CHOLECYSTECTOMY     EYE SURGERY Bilateral    TUBAL LIGATION Bilateral     Current Outpatient Medications  Medication Instructions   amitriptyline (ELAVIL) 25 mg, Oral, Daily at bedtime   apixaban (ELIQUIS) 5 mg, Oral, 2 times daily   BD INSULIN SYRINGE U/F 31G X 5/16" 1 ML MISC USE  1 (ONE) SYRINGE 3-4 TIMES DAILY   fenofibrate micronized (LOFIBRA) 200 mg, Oral, Daily   gabapentin (NEURONTIN) 600 mg, Oral, Daily at bedtime   ibuprofen (ADVIL) 800 mg, Oral, 3 times daily PRN   insulin NPH Human (NOVOLIN N) 18-22 Units, Subcutaneous, 2 times daily before meals, Per patients sliding scale: breakfast - 20-22 units,  supper 18-20 units   insulin regular (NOVOLIN R) 18-24 Units, Subcutaneous, 2 times daily before meals, Per patients sliding scale: breakfast 20-24 units, supper 18-20   LUTEIN PO 1 capsule, Oral, 2 times daily   metoprolol tartrate (LOPRESSOR) 50 mg, Oral, 2 times daily   Multaq 400 mg, Oral, 2 times daily with meals   Multiple Vitamin (MULTIVITAMIN WITH MINERALS) TABS 1 tablet, Daily   SUMAtriptan (IMITREX) 100 mg, Oral, Every 2 hours PRN   Vitamin D (Ergocalciferol) (DRISDOL) 50,000 Units, Oral, Weekly    Social History:  The patient  reports that she has quit smoking. Her smoking use included cigarettes. She has never used smokeless tobacco. She reports current alcohol use. She reports that she does not use drugs.   Family History:  The patient's family history includes Colon cancer in her maternal aunt; Kidney disease in her sister.  ROS:  Please see the history of present illness. All other systems are reviewed and otherwise negative.   PHYSICAL EXAM:  VS:  BP 104/78 (BP Location: Left Arm, Patient Position: Sitting, Cuff Size: Large)   Pulse (!) 139  Ht 5\' 3"  (1.6 m)   Wt 221 lb (100.2 kg)   SpO2 99%   BMI 39.15 kg/m  BMI: Body mass index is 39.15 kg/m.  GEN- The patient is well appearing, alert and oriented x 3 today.   Lungs- Clear to ausculation bilaterally, normal work of breathing.  Heart- Irregularly irregular rate and rhythm, no murmurs, rubs or gallops Extremities- No peripheral edema, warm, dry  EKG is ordered. Personal review of EKG from today shows: EKG Interpretation Date/Time:  Friday September 27 2022 14:04:10 EDT Ventricular Rate:   139 PR Interval:    QRS Duration:  78 QT Interval:  272 QTC Calculation: 413 R Axis:   15  Text Interpretation: typical counterclokwise Atrial flutter with variable A-V block ST & T wave abnormality, consider anterolateral ischemia When compared with ECG of 24-May-2022 17:39, ST now depressed in Lateral leads T wave inversion more evident in Anterolateral leads Confirmed by Sherie Don [phone]) on 09/27/2022 4:21:31 PM    Recent Labs: 05/24/2022: TSH 1.001 06/13/2022: ALT 16; BUN 18; Creatinine, Ser 0.89; Potassium 4.1; Sodium 133 09/27/2022: Hemoglobin 14.9; Platelets 345  No results found for requested labs within last 365 days.   CrCl cannot be calculated (Patient's most recent lab result is older than the maximum 21 days allowed.).   Wt Readings from Last 3 Encounters:  09/27/22 221 lb (100.2 kg)  08/22/22 217 lb (98.4 kg)  07/29/22 217 lb (98.4 kg)     Additional studies reviewed include: Previous EP, cardiology notes.   Exercise treadmill study, 07/26/2022 Target heart rate achieved Increase in QRS at peak exercise with normalization in recovery  Long term monitor, 05/31/2022   Normal sinus rhythm predominantly ranging between 43 and 124 bpm with average HR 68 bpm   <1% Atrial flutter burden ranging from 52-124 bpm (avg 96 bpm), longest lasting 4 minute 31 seconds   No evidence of AFib or ventricular arrythmias   No evidence of AV blocks or pauses   4.5% PAC burden and 0% PVC burden   Patient triggered events correlate with AFlutter, NSR and PAC  TTE, 05/26/2022  1. Left ventricular ejection fraction, by estimation, is 60 to 65%. The left ventricle has normal function. The left ventricle has no regional wall motion abnormalities. Left ventricular diastolic parameters are indeterminate.   2. Right ventricular systolic function is normal. The right ventricular size is normal. There is normal pulmonary artery systolic pressure. The estimated right ventricular systolic pressure  is 26.0 mmHg.   3. The mitral valve is normal in structure. No evidence of mitral valve regurgitation. No evidence of mitral stenosis.   4. The aortic valve has an indeterminant number of cusps. Aortic valve regurgitation is not visualized. No aortic stenosis is present.   5. The inferior vena cava is normal in size with greater than 50% respiratory variability, suggesting right atrial pressure of 3 mmHg.   6. There is a very small patent foramen ovale with minimal left to right shunting across the atrial septum.    ASSESSMENT AND PLAN:  #) parox AFib #) typical aflutter AF ablation scheduled w Dr.Lambert Tolerating multaq well, has ran out of medication 2 days ago and unable to obtain refill Continue lopressor 50mg  BID Patient feels confident she converted to arrhythmia yesterday Given palpitations with SOB and chest discomfort, will proceed with TEE/DCCV at next available (in 3 days). Discussed risks/benefits, patient verbalized understanding and wished to proceed. Initiate eliquis 5mg  BID today, discussed patient to continue anti-coag until 4  weeks post DCCV. Will send multaq to [facility] pharmacy, who have confirmed they have mediation in stock.  ER precautions reviewed  Informed Consent   Shared Decision Making/Informed Consent The risks [stroke, cardiac arrhythmias rarely resulting in the need for a temporary or permanent pacemaker, skin irritation or burns, esophageal damage, perforation (1:10,000 risk), bleeding, pharyngeal hematoma as well as other potential complications associated with conscious sedation including aspiration, arrhythmia, respiratory failure and death], benefits (treatment guidance, restoration of normal sinus rhythm, diagnostic support) and alternatives of a transesophageal echocardiogram guided cardioversion were discussed in detail with Ms. Curless and she is willing to proceed.      Current medicines are reviewed at length with the patient today.   The patient  does not have concerns regarding her medicines.  The following changes were made today:   Restart 400mg  multaq BID START 5mg  eliquis BID  Labs/ tests ordered today include:  Orders Placed This Encounter  Procedures   CBC   Basic metabolic panel   EKG 12-Lead     Disposition: Follow up with EP APP in as usual post procedure   Signed, Sherie Don, NP  09/27/22  4:21 PM  Electrophysiology [facility]

## 2022-09-26 NOTE — H&P (View-Only) (Signed)
Cardiology Office Note Date:  09/27/2022  Patient ID:  Katie Hampton, DOB Dec 02, 1980, MRN [id number] PCP:  [facility]  Cardiologist:  None Electrophysiologist: Lanier Prude, MD    Chief Complaint: afib  History of Present Illness: Katie Hampton is a 42 y.o. female with PMH notable for Aflutter, Atach, AFib, T2DM; seen today for Lanier Prude, MD for routine EP follow-up.  She was first diagnosed with A-fib 05/2022 while hospitalized due to sepsis from a right renal abscess.  Was started on flecainide and metoprolol at that time.  No anticoagulation due to low CHA2DS2-VASc and active infection.   Last seen by Dr. Lalla Brothers 07/2022.  She had run out of the medication and had noticed a significant increase in A-fib burden.  She was scheduled for an ablation and restarted flecainide. During ETT, she had significant increase in her QRS duration and so flecainide was stopped.  I saw her in follow-up 07/2022, and started multaq to avoid her exposure to amiodarone given young age.   On follow-up today, patient has run out of multaq about 2 days ago. [facility] in [facility] and so it is being ordered. She is pretty sure she converted to afib/flutter yesterday. Has palpitations, feels dizzy, with SOB. Has noticed that she gets fatigued much quicker than she used to.  Prior to running out of multaq, she was feeling well.   She denies increased edema, good appetite.   AAD History: Flecainide - prolonged QRS Multaq  Past Medical History:  Diagnosis Date   Diabetes mellitus    Pyelonephritis     Past Surgical History:  Procedure Laterality Date   CHOLECYSTECTOMY     EYE SURGERY Bilateral    TUBAL LIGATION Bilateral     Current Outpatient Medications  Medication Instructions   amitriptyline (ELAVIL) 25 mg, Oral, Daily at bedtime   apixaban (ELIQUIS) 5 mg, Oral, 2 times daily   BD INSULIN SYRINGE U/F 31G X 5/16" 1 ML MISC USE  1 (ONE) SYRINGE 3-4 TIMES DAILY   fenofibrate micronized (LOFIBRA) 200 mg, Oral, Daily   gabapentin (NEURONTIN) 600 mg, Oral, Daily at bedtime   ibuprofen (ADVIL) 800 mg, Oral, 3 times daily PRN   insulin NPH Human (NOVOLIN N) 18-22 Units, Subcutaneous, 2 times daily before meals, Per patients sliding scale: breakfast - 20-22 units,  supper 18-20 units   insulin regular (NOVOLIN R) 18-24 Units, Subcutaneous, 2 times daily before meals, Per patients sliding scale: breakfast 20-24 units, supper 18-20   LUTEIN PO 1 capsule, Oral, 2 times daily   metoprolol tartrate (LOPRESSOR) 50 mg, Oral, 2 times daily   Multaq 400 mg, Oral, 2 times daily with meals   Multiple Vitamin (MULTIVITAMIN WITH MINERALS) TABS 1 tablet, Daily   SUMAtriptan (IMITREX) 100 mg, Oral, Every 2 hours PRN   Vitamin D (Ergocalciferol) (DRISDOL) 50,000 Units, Oral, Weekly    Social History:  The patient  reports that she has quit smoking. Her smoking use included cigarettes. She has never used smokeless tobacco. She reports current alcohol use. She reports that she does not use drugs.   Family History:  The patient's family history includes Colon cancer in her maternal aunt; Kidney disease in her sister.  ROS:  Please see the history of present illness. All other systems are reviewed and otherwise negative.   PHYSICAL EXAM:  VS:  BP 104/78 (BP Location: Left Arm, Patient Position: Sitting, Cuff Size: Large)   Pulse (!) 139  Ht 5\' 3"  (1.6 m)   Wt 221 lb (100.2 kg)   SpO2 99%   BMI 39.15 kg/m  BMI: Body mass index is 39.15 kg/m.  GEN- The patient is well appearing, alert and oriented x 3 today.   Lungs- Clear to ausculation bilaterally, normal work of breathing.  Heart- Irregularly irregular rate and rhythm, no murmurs, rubs or gallops Extremities- No peripheral edema, warm, dry  EKG is ordered. Personal review of EKG from today shows: EKG Interpretation Date/Time:  Friday September 27 2022 14:04:10 EDT Ventricular Rate:   139 PR Interval:    QRS Duration:  78 QT Interval:  272 QTC Calculation: 413 R Axis:   15  Text Interpretation: typical counterclokwise Atrial flutter with variable A-V block ST & T wave abnormality, consider anterolateral ischemia When compared with ECG of 24-May-2022 17:39, ST now depressed in Lateral leads T wave inversion more evident in Anterolateral leads Confirmed by Sherie Don [phone]) on 09/27/2022 4:21:31 PM    Recent Labs: 05/24/2022: TSH 1.001 06/13/2022: ALT 16; BUN 18; Creatinine, Ser 0.89; Potassium 4.1; Sodium 133 09/27/2022: Hemoglobin 14.9; Platelets 345  No results found for requested labs within last 365 days.   CrCl cannot be calculated (Patient's most recent lab result is older than the maximum 21 days allowed.).   Wt Readings from Last 3 Encounters:  09/27/22 221 lb (100.2 kg)  08/22/22 217 lb (98.4 kg)  07/29/22 217 lb (98.4 kg)     Additional studies reviewed include: Previous EP, cardiology notes.   Exercise treadmill study, 07/26/2022 Target heart rate achieved Increase in QRS at peak exercise with normalization in recovery  Long term monitor, 05/31/2022   Normal sinus rhythm predominantly ranging between 43 and 124 bpm with average HR 68 bpm   <1% Atrial flutter burden ranging from 52-124 bpm (avg 96 bpm), longest lasting 4 minute 31 seconds   No evidence of AFib or ventricular arrythmias   No evidence of AV blocks or pauses   4.5% PAC burden and 0% PVC burden   Patient triggered events correlate with AFlutter, NSR and PAC  TTE, 05/26/2022  1. Left ventricular ejection fraction, by estimation, is 60 to 65%. The left ventricle has normal function. The left ventricle has no regional wall motion abnormalities. Left ventricular diastolic parameters are indeterminate.   2. Right ventricular systolic function is normal. The right ventricular size is normal. There is normal pulmonary artery systolic pressure. The estimated right ventricular systolic pressure  is 26.0 mmHg.   3. The mitral valve is normal in structure. No evidence of mitral valve regurgitation. No evidence of mitral stenosis.   4. The aortic valve has an indeterminant number of cusps. Aortic valve regurgitation is not visualized. No aortic stenosis is present.   5. The inferior vena cava is normal in size with greater than 50% respiratory variability, suggesting right atrial pressure of 3 mmHg.   6. There is a very small patent foramen ovale with minimal left to right shunting across the atrial septum.    ASSESSMENT AND PLAN:  #) parox AFib #) typical aflutter AF ablation scheduled w Dr.Lambert Tolerating multaq well, has ran out of medication 2 days ago and unable to obtain refill Continue lopressor 50mg  BID Patient feels confident she converted to arrhythmia yesterday Given palpitations with SOB and chest discomfort, will proceed with TEE/DCCV at next available (in 3 days). Discussed risks/benefits, patient verbalized understanding and wished to proceed. Initiate eliquis 5mg  BID today, discussed patient to continue anti-coag until 4  weeks post DCCV. Will send multaq to [facility] pharmacy, who have confirmed they have mediation in stock.  ER precautions reviewed  Informed Consent   Shared Decision Making/Informed Consent The risks [stroke, cardiac arrhythmias rarely resulting in the need for a temporary or permanent pacemaker, skin irritation or burns, esophageal damage, perforation (1:10,000 risk), bleeding, pharyngeal hematoma as well as other potential complications associated with conscious sedation including aspiration, arrhythmia, respiratory failure and death], benefits (treatment guidance, restoration of normal sinus rhythm, diagnostic support) and alternatives of a transesophageal echocardiogram guided cardioversion were discussed in detail with Ms. Curless and she is willing to proceed.      Current medicines are reviewed at length with the patient today.   The patient  does not have concerns regarding her medicines.  The following changes were made today:   Restart 400mg  multaq BID START 5mg  eliquis BID  Labs/ tests ordered today include:  Orders Placed This Encounter  Procedures   CBC   Basic metabolic panel   EKG 12-Lead     Disposition: Follow up with EP APP in as usual post procedure   Signed, Sherie Don, NP  09/27/22  4:21 PM  Electrophysiology [facility]

## 2022-09-27 ENCOUNTER — Ambulatory Visit: Payer: Medicare Other | Attending: Cardiology | Admitting: Cardiology

## 2022-09-27 ENCOUNTER — Other Ambulatory Visit
Admission: RE | Admit: 2022-09-27 | Discharge: 2022-09-27 | Disposition: A | Discharge status: Home / Self Care | Payer: Medicare Other | Source: Ambulatory Visit | Attending: Cardiology | Admitting: Cardiology

## 2022-09-27 ENCOUNTER — Other Ambulatory Visit: Payer: Self-pay

## 2022-09-27 ENCOUNTER — Encounter: Payer: Self-pay | Admitting: Cardiology

## 2022-09-27 VITALS — BP 104/78 | HR 139 | Ht 63.0 in | Wt 221.0 lb

## 2022-09-27 DIAGNOSIS — I483 Typical atrial flutter: Secondary | ICD-10-CM | POA: Insufficient documentation

## 2022-09-27 DIAGNOSIS — I480 Paroxysmal atrial fibrillation: Secondary | ICD-10-CM | POA: Insufficient documentation

## 2022-09-27 LAB — CBC
HCT: 42 % (ref 36.0–46.0)
Hemoglobin: 14.9 g/dL (ref 12.0–15.0)
MCH: 32.7 pg (ref 26.0–34.0)
MCHC: 35.5 g/dL (ref 30.0–36.0)
MCV: 92.3 fL (ref 80.0–100.0)
Platelets: 345 10*3/uL (ref 150–400)
RBC: 4.55 MIL/uL (ref 3.87–5.11)
RDW: 13.6 % (ref 11.5–15.5)
WBC: 8.6 10*3/uL (ref 4.0–10.5)
nRBC: 0 % (ref 0.0–0.2)

## 2022-09-27 LAB — BASIC METABOLIC PANEL
Anion gap: 13 (ref 5–15)
BUN: 10 mg/dL (ref 6–20)
CO2: 22 mmol/L (ref 22–32)
Calcium: 10 mg/dL (ref 8.9–10.3)
Chloride: 103 mmol/L (ref 98–111)
Creatinine, Ser: 0.78 mg/dL (ref 0.44–1.00)
GFR, Estimated: >60 mL/min (ref >60)
Glucose, Bld: 157 mg/dL — ABNORMAL HIGH (ref 70–99)
Potassium: 3.7 mmol/L (ref 3.5–5.1)
Sodium: 138 mmol/L (ref 135–145)

## 2022-09-27 MED ORDER — APIXABAN 5 MG PO TABS: 5.0000 mg | ORAL_TABLET | Freq: Two times a day (BID) | ORAL | 1 refills | Status: DC

## 2022-09-27 MED ORDER — MULTAQ 400 MG PO TABS: 400.0000 mg | ORAL_TABLET | Freq: Two times a day (BID) | ORAL | 2 refills | Status: DC

## 2022-09-27 MED ORDER — MULTAQ 400 MG PO TABS
400.0000 mg | ORAL_TABLET | Freq: Two times a day (BID) | ORAL | 3 refills | Status: DC
  Filled 2022-09-27 (×2): qty 60, 30d supply, fill #0

## 2022-09-27 NOTE — Patient Instructions (Signed)
Medication Instructions:   START Eliquis 5 mg tablet by mouth twice daily.  Start Multaq 400 mg tablet by mouth twice daily.  *If you need a refill on your cardiac medications before your next appointment, please call your pharmacy*   Lab Work:  Your physician recommends that you have lab work TODAY.  CBC/BMP  If you have labs (blood work) drawn today and your tests are completely normal, you will receive your results only by: MyChart Message (if you have MyChart) OR A paper copy in the mail If you have any lab test that is abnormal or we need to change your treatment, we will call you to review the results.   Testing/Procedures:      Dear [name]  You are scheduled for a TEE (Transesophageal Echocardiogram) Guided Cardioversion on Monday, July 15 with Dr. Kirke Corin.  Please arrive at the [facility] Entrance of [facility], [address], [place] [address] at 6:30 AM (This is 1 hour(s) prior to your procedure time).  Proceed to the Check-In Desk directly inside the entrance.  Procedure Parking: Use the entrance off of the [address] side of the hospital. Turn right upon entering and follow the driveway to parking that is directly in front of the [facility]. There is no valet parking available at this entrance, however there is an awning directly in front of the [facility] Center for drop off/ pick up for patients.   DIET:  Nothing to eat or drink after midnight except a sip of water with medications (see medication instructions below)  MEDICATION INSTRUCTIONS: !!IF ANY NEW MEDICATIONS ARE STARTED AFTER TODAY, PLEASE NOTIFY YOUR PROVIDER AS SOON AS POSSIBLE!!  FYI: Medications such as Semaglutide (Ozempic, [place]), Tirzepatide (Mounjaro, Zepbound), Dulaglutide (Trulicity), etc ("GLP1 agonists") AND Canagliflozin (Invokana), Dapagliflozin (Farxiga), Empagliflozin (Jardiance), Ertugliflozin (Steglatro), Bexagliflozin [employer]) or any  combination with one of these drugs such as Invokamet (Canagliflozin/Metformin), Synjardy (Empagliflozin/Metformin), etc ("SGLT2 inhibitors") must be held around the time of a procedure. This is not a comprehensive list of all of these drugs. Please review all of your medications and talk to your provider if you take any one of these. If you are not sure, ask your provider.   - Insulin N and Insulin R : Please take only take 1/2 your dose the night before your procedure.         Please Continue taking your anticoagulant (blood thinner): Apixaban (Eliquis).  You will need to continue this after your procedure until you are told by your provider that it is safe to stop.    LABS:    -CBC/BMP TODAY AT [facility]   FYI:  For your safety, and to allow [place] to monitor your vital signs accurately during the surgery/procedure we request: If you have artificial nails, gel coating, SNS etc, please have those removed prior to your surgery/procedure. Not having the nail coverings /polish removed may result in cancellation or delay of your surgery/procedure.  You must have a responsible person to drive you home and stay in the waiting area during your procedure. Failure to do so could result in cancellation.  Bring your insurance cards.  *Special Note: Every effort is made to have your procedure done on time. Occasionally there are emergencies that occur at the hospital that may cause delays. Please be patient if a delay does occur.       Follow-Up: At [facility], you and your health needs are our priority.  As part of our continuing mission to provide you with exceptional heart care, we have created designated Provider Care Teams.  These Care Teams include your primary Cardiologist (physician) and Advanced Practice Providers (APPs -  Physician Assistants and Nurse Practitioners) who all work together to provide you with the care you need, when you need it.  We recommend signing up  for the patient portal called "MyChart".  Sign up information is provided on this After Visit Summary.  MyChart is used to connect with patients for Virtual Visits (Telemedicine).  Patients are able to view lab/test results, encounter notes, upcoming appointments, etc.  Non-urgent messages can be sent to your provider as well.   To learn more about what you can do with MyChart, go to [email or web address].    Your next appointment:   2-4 week(s)  Provider:   Sherie Don, NP

## 2022-09-30 ENCOUNTER — Other Ambulatory Visit: Payer: Self-pay

## 2022-09-30 ENCOUNTER — Telehealth: Payer: Self-pay | Admitting: Cardiology

## 2022-09-30 ENCOUNTER — Ambulatory Visit: Payer: Medicare Other | Admitting: Anesthesiology

## 2022-09-30 ENCOUNTER — Ambulatory Visit: Admission: RE | Admit: 2022-09-30 | Payer: Medicare Other | Source: Home / Self Care | Admitting: Cardiovascular Disease

## 2022-09-30 ENCOUNTER — Encounter: Payer: Self-pay | Admitting: Cardiovascular Disease

## 2022-09-30 ENCOUNTER — Encounter
Admission: RE | Disposition: A | Discharge status: Home / Self Care | Payer: Self-pay | Source: Home / Self Care | Attending: Cardiology

## 2022-09-30 ENCOUNTER — Ambulatory Visit
Admission: RE | Admit: 2022-09-30 | Discharge: 2022-09-30 | Disposition: A | Discharge status: Home / Self Care | Payer: Medicare Other | Attending: Cardiology | Admitting: Cardiology

## 2022-09-30 DIAGNOSIS — Z79899 Other long term (current) drug therapy: Secondary | ICD-10-CM | POA: Insufficient documentation

## 2022-09-30 DIAGNOSIS — I361 Nonrheumatic tricuspid (valve) insufficiency: Secondary | ICD-10-CM | POA: Diagnosis not present

## 2022-09-30 DIAGNOSIS — E119 Type 2 diabetes mellitus without complications: Secondary | ICD-10-CM | POA: Diagnosis not present

## 2022-09-30 DIAGNOSIS — I483 Typical atrial flutter: Secondary | ICD-10-CM | POA: Diagnosis present

## 2022-09-30 DIAGNOSIS — Z7901 Long term (current) use of anticoagulants: Secondary | ICD-10-CM | POA: Diagnosis not present

## 2022-09-30 DIAGNOSIS — I480 Paroxysmal atrial fibrillation: Secondary | ICD-10-CM | POA: Diagnosis not present

## 2022-09-30 DIAGNOSIS — Z794 Long term (current) use of insulin: Secondary | ICD-10-CM | POA: Diagnosis not present

## 2022-09-30 DIAGNOSIS — I4892 Unspecified atrial flutter: Secondary | ICD-10-CM | POA: Diagnosis not present

## 2022-09-30 HISTORY — PX: CARDIOVERSION: SHX1299

## 2022-09-30 LAB — ECHO TEE

## 2022-09-30 LAB — GLUCOSE, CAPILLARY: Glucose-Capillary: 248 mg/dL — ABNORMAL HIGH (ref 70–99)

## 2022-09-30 SURGERY — CARDIOVERSION
Anesthesia: General

## 2022-09-30 MED ORDER — LIDOCAINE VISCOUS HCL 2 % MT SOLN
OROMUCOSAL | Status: AC
  Filled 2022-09-30: qty 15

## 2022-09-30 MED ORDER — PROPOFOL 10 MG/ML IV BOLUS
INTRAVENOUS | Status: DC | PRN
Start: 2022-09-30 — End: 2022-09-30
  Administered 2022-09-30: 100 mg via INTRAVENOUS
  Administered 2022-09-30: 50 mg via INTRAVENOUS
  Administered 2022-09-30: 40 mg via INTRAVENOUS
  Administered 2022-09-30: 50 mg via INTRAVENOUS
  Administered 2022-09-30: 60 mg via INTRAVENOUS
  Administered 2022-09-30: 100 mg via INTRAVENOUS

## 2022-09-30 MED ORDER — SODIUM CHLORIDE 0.9 % IV SOLN: INTRAVENOUS | Status: DC

## 2022-09-30 MED ORDER — LIDOCAINE HCL (CARDIAC) PF 100 MG/5ML IV SOSY
PREFILLED_SYRINGE | INTRAVENOUS | Status: DC | PRN
  Administered 2022-09-30: 60 mg via INTRAVENOUS

## 2022-09-30 MED ORDER — ACETAMINOPHEN 160 MG/5ML PO SOLN: 325.0000 mg | ORAL | Status: DC | PRN

## 2022-09-30 MED ORDER — ONDANSETRON HCL 4 MG/2ML IJ SOLN: 4.0000 mg | Freq: Once | INTRAMUSCULAR | Status: DC | PRN

## 2022-09-30 MED ORDER — PHENYLEPHRINE 80 MCG/ML (10ML) SYRINGE FOR IV PUSH (FOR BLOOD PRESSURE SUPPORT)
PREFILLED_SYRINGE | INTRAVENOUS | Status: DC | PRN
  Administered 2022-09-30 (×2): 80 ug via INTRAVENOUS

## 2022-09-30 MED ORDER — ACETAMINOPHEN 325 MG PO TABS: 650.0000 mg | ORAL_TABLET | Freq: Once | ORAL | Status: DC | PRN

## 2022-09-30 NOTE — Interval H&P Note (Signed)
History and Physical Interval Note:  09/30/2022 7:59 AM  Katie Hampton  has presented today for surgery, with the diagnosis of atrial flutter.  The various methods of treatment have been discussed with the patient and family. After consideration of risks, benefits and other options for treatment, the patient has consented to  Procedure(s): CARDIOVERSION (N/A) as a surgical intervention.  The patient's history has been reviewed, patient examined, no change in status, stable for surgery.  I have reviewed the patient's chart and labs.  Questions were answered to the patient's satisfaction.     Arlys John Agbor-Etang

## 2022-09-30 NOTE — Anesthesia Preprocedure Evaluation (Addendum)
Anesthesia Evaluation  Patient identified by MRN, date of birth, ID band Patient awake    Reviewed: Allergy & Precautions, NPO status , Patient's Chart, lab work & pertinent test results  History of Anesthesia Complications Negative for: history of anesthetic complications  Airway Mallampati: III       Dental  (+) Missing, Chipped   Pulmonary former smoker (quit 9 years ago)   Pulmonary exam normal breath sounds clear to auscultation       Cardiovascular + dysrhythmias (a fib on Eliquis)  Rhythm:Irregular Rate:Normal  Echo 05/26/22:  1. Left ventricular ejection fraction, by estimation, is 60 to 65%. The left ventricle has normal function. The left ventricle has no regional wall motion abnormalities. Left ventricular diastolic parameters are indeterminate.   2. Right ventricular systolic function is normal. The right ventricular size is normal. There is normal pulmonary artery systolic pressure. The estimated right ventricular systolic pressure is 26.0 mmHg.   3. The mitral valve is normal in structure. No evidence of mitral valve regurgitation. No evidence of mitral stenosis.   4. The aortic valve has an indeterminant number of cusps. Aortic valve regurgitation is not visualized. No aortic stenosis is present.   5. The inferior vena cava is normal in size with greater than 50% respiratory variability, suggesting right atrial pressure of 3 mmHg.   6. There is a very small patent foramen ovale with minimal left to right shunting across the atrial septum.    Neuro/Psych negative neurological ROS     GI/Hepatic negative GI ROS,,,  Endo/Other  diabetes, Type 1  Obesity   Renal/GU      Musculoskeletal   Abdominal   Peds  Hematology negative hematology ROS (+)   Anesthesia Other Findings Cardiology note 09/27/22:  ASSESSMENT AND PLAN:   #) parox AFib #) typical aflutter AF ablation scheduled w Dr.Lambert Tolerating  multaq well, has ran out of medication 2 days ago and unable to obtain refill Continue lopressor 50mg  BID Patient feels confident she converted to arrhythmia yesterday Given palpitations with SOB and chest discomfort, will proceed with TEE/DCCV at next available (in 3 days). Discussed risks/benefits, patient verbalized understanding and wished to proceed. Initiate eliquis 5mg  BID today, discussed patient to continue anti-coag until 4 weeks post DCCV. Will send multaq to [facility] pharmacy, who have confirmed they have mediation in stock.  ER precautions reviewed  Reproductive/Obstetrics                             Anesthesia Physical Anesthesia Plan  ASA: 3  Anesthesia Plan: General   Post-op Pain Management:    Induction: Intravenous  PONV Risk Score and Plan: 3 and Propofol infusion, TIVA and Treatment may vary due to age or medical condition  Airway Management Planned: Natural Airway  Additional Equipment:   Intra-op Plan:   Post-operative Plan:   Informed Consent: I have reviewed the patients History and Physical, chart, labs and discussed the procedure including the risks, benefits and alternatives for the proposed anesthesia with the patient or authorized representative who has indicated his/her understanding and acceptance.       Plan Discussed with: CRNA  Anesthesia Plan Comments: (LMA/GETA backup discussed.  Patient consented for risks of anesthesia including but not limited to:  - adverse reactions to medications - damage to eyes, teeth, lips or other oral mucosa - nerve damage due to positioning  - sore throat or hoarseness - damage to heart, brain, nerves, lungs,  other parts of body or loss of life  Informed patient about role of CRNA in peri- and intra-operative care.  Patient voiced understanding.)        Anesthesia Quick Evaluation

## 2022-09-30 NOTE — Telephone Encounter (Signed)
I attempted to call patient to discuss unsuccessful DCCV performed earlier today.  No answer, left msg.

## 2022-09-30 NOTE — Transfer of Care (Signed)
Immediate Anesthesia Transfer of Care Note  Patient: Katie Hampton  Procedure(s) Performed: CARDIOVERSION  Patient Location: Short Stay  Anesthesia Type:General  Level of Consciousness: awake, alert , and oriented  Airway & Oxygen Therapy: Patient Spontanous Breathing and Patient connected to nasal cannula oxygen  Post-op Assessment: Report given to RN and Post -op Vital signs reviewed and stable  Post vital signs: Reviewed and stable  Last Vitals:  Vitals Value Taken Time  BP 112/67 09/30/22 0845  Temp    Pulse 70 09/30/22 0854  Resp 19 09/30/22 0854  SpO2 98 % 09/30/22 0854  Vitals shown include unfiled device data.  Last Pain:  Vitals:   09/30/22 0845  TempSrc:   PainSc: 0-No pain         Complications: No notable events documented.

## 2022-09-30 NOTE — Procedures (Addendum)
Transesophageal Echocardiogram and DC cardioversion:  Indication: afib/atrial flutter Requesting/ordering  physician:   Procedure: Benzocaine spray x2 and 2 mls x 2 of viscous lidocaine were given orally to provide local anesthesia to the oropharynx. The patient was positioned supine on the left side, bite block provided. The patient was sedated per anesthesia team.  Using digital technique an omniplane probe was advanced into the esophagus without incident.   See report in EPIC  for complete details: In brief, imaging revealed normal LV function with no RWMAs and no mural apical thrombus.  .  Estimated ejection fraction was 60%.  Right sided cardiac chambers were normal with no evidence of pulmonary hypertension.  Imaging of the septum showed no ASD or VSD 2D and color flow confirmed no PFO  The LA was well visualized in orthogonal views.  There was no spontaneous contrast and no thrombus in the LA and LA appendage   Cardioversion procedure note For atrial fibrillation.  Procedure Details:  Consent: Risks of procedure as well as the alternatives and risks of each were explained to the (patient/caregiver).  Consent for procedure obtained.  Time Out: Verified patient identification, verified procedure, site/side was marked, verified correct patient position, special equipment/implants available, medications/allergies/relevent history reviewed, required imaging and test results available.  Performed  Patient placed on cardiac monitor, pulse oximetry, supplemental oxygen as necessary.   Sedation given: propofol IV per anesthesia team Pacer pads placed anterior and posterior chest.   Cardioverted 3 time(s).   Cardioverted at  150J x 1, 200J x 2. Synchronized biphasic Cardioversion attempts were not successful.  There was no conversion to sinus rhythm, even briefly.   Evaluation: Findings: Post procedure EKG shows atrial flutter Complications: None Patient did tolerate procedure  well.  RECs Cont multaq, eliquis Keep outpatient EP f/u  Time Spent Directly with the Patient:  25 minutes   Debbe Odea, M.D.   Arlys John Agbor-Etang 09/30/2022 8:22 AM

## 2022-09-30 NOTE — Progress Notes (Signed)
*  PRELIMINARY RESULTS* Echocardiogram Echocardiogram Transesophageal has been performed.  Cristela Blue 09/30/2022, 8:33 AM

## 2022-10-01 NOTE — Anesthesia Postprocedure Evaluation (Signed)
Anesthesia Post Note  Patient: Katie Hampton  Procedure(s) Performed: CARDIOVERSION  Patient location during evaluation: PACU Anesthesia Type: General Level of consciousness: awake and alert, oriented and patient cooperative Pain management: pain level controlled Vital Signs Assessment: post-procedure vital signs reviewed and stable Respiratory status: spontaneous breathing, nonlabored ventilation and respiratory function stable Cardiovascular status: blood pressure returned to baseline and stable Postop Assessment: adequate PO intake Anesthetic complications: no   No notable events documented.   Last Vitals:  Vitals:   09/30/22 0915 09/30/22 0926  BP: (!) 108/55 109/68  Pulse: 71 70  Resp: 18 (!) 22  Temp:    SpO2: 97% 96%    Last Pain:  Vitals:   09/30/22 0926  TempSrc:   PainSc: 0-No pain                 Reed Breech

## 2022-10-01 NOTE — Telephone Encounter (Signed)
Patient responded to MyChart message.

## 2022-10-01 NOTE — Telephone Encounter (Signed)
Left detailed message per DPR on voicemail informing patient of cardioversion procedure person/place/time. Also sent message through MyChart with procedure instructions. Procedure scheduled through Clydie Braun in the [facility] Cath Lab by Sherie Don, NP.

## 2022-10-01 NOTE — Telephone Encounter (Signed)
Patient returned my call.  Discussed repeat cardioversion with Dr. Lalla Brothers in the Cath Lab at [facility].  She is agreeable to this.  Will schedule for 7/19 She has been diligently taking Eliquis since it was started last week in clinic, no missed doses.  Discussed that given her TEE did not show thrombus, and she has been diligent with Eliquis, she would not need a repeat TEE.  Stressed the importance of being diligent with blood thinner.

## 2022-10-03 NOTE — Pre-Procedure Instructions (Signed)
Attempted to call patient regarding procedure instructions.  Left voice mail on the following.  Nothing to eat or drink after midnight.  You will need a responsible adult to drive you home after midnight.   Take your Eliquis and BP medications in the morning.  If you have missed any doses of Eliquis please let office know.

## 2022-10-04 ENCOUNTER — Telehealth: Payer: Self-pay

## 2022-10-04 DIAGNOSIS — I483 Typical atrial flutter: Secondary | ICD-10-CM

## 2022-10-04 DIAGNOSIS — I4819 Other persistent atrial fibrillation: Secondary | ICD-10-CM

## 2022-10-04 NOTE — Pre-Procedure Instructions (Signed)
Notified patient that procedure has been canceled today due to IT issues. Office will call to reschedule.

## 2022-10-04 NOTE — Telephone Encounter (Signed)
Due to outage pt's DCCV has been moved from 7/19 to 7/22 with Dr. Elberta Fortis at 1:00 PM in the Cath lab.  Per Sherie Don, NP she needs to be scheduled in the Cath lab with Anesthesia

## 2022-10-06 NOTE — Pre-Procedure Instructions (Signed)
Instructed patient on the following items: Arrival time 1100 Nothing to eat or drink after midnight No meds AM of procedure Responsible person to drive you home and stay with you for 24 hrs  Have you missed any doses of anti-coagulant Eliquis- takes twice a day, hasn't missed any doses.  Take dose in the morning.

## 2022-10-07 ENCOUNTER — Ambulatory Visit (HOSPITAL_COMMUNITY): Payer: Medicare Other | Admitting: Anesthesiology

## 2022-10-07 ENCOUNTER — Ambulatory Visit (HOSPITAL_COMMUNITY)
Admission: RE | Admit: 2022-10-07 | Discharge: 2022-10-07 | Disposition: A | Discharge status: Home / Self Care | Payer: Medicare Other | Attending: Cardiology | Admitting: Cardiology

## 2022-10-07 ENCOUNTER — Ambulatory Visit (HOSPITAL_BASED_OUTPATIENT_CLINIC_OR_DEPARTMENT_OTHER): Payer: Medicare Other | Admitting: Anesthesiology

## 2022-10-07 ENCOUNTER — Encounter (HOSPITAL_COMMUNITY)
Admission: RE | Disposition: A | Discharge status: Home / Self Care | Payer: Self-pay | Source: Home / Self Care | Attending: Cardiology

## 2022-10-07 ENCOUNTER — Other Ambulatory Visit: Payer: Self-pay

## 2022-10-07 DIAGNOSIS — Z7901 Long term (current) use of anticoagulants: Secondary | ICD-10-CM | POA: Diagnosis not present

## 2022-10-07 DIAGNOSIS — I4891 Unspecified atrial fibrillation: Secondary | ICD-10-CM

## 2022-10-07 DIAGNOSIS — I4892 Unspecified atrial flutter: Secondary | ICD-10-CM | POA: Insufficient documentation

## 2022-10-07 DIAGNOSIS — E669 Obesity, unspecified: Secondary | ICD-10-CM

## 2022-10-07 DIAGNOSIS — E785 Hyperlipidemia, unspecified: Secondary | ICD-10-CM

## 2022-10-07 DIAGNOSIS — I480 Paroxysmal atrial fibrillation: Secondary | ICD-10-CM | POA: Diagnosis present

## 2022-10-07 DIAGNOSIS — I4819 Other persistent atrial fibrillation: Secondary | ICD-10-CM

## 2022-10-07 DIAGNOSIS — Z794 Long term (current) use of insulin: Secondary | ICD-10-CM | POA: Insufficient documentation

## 2022-10-07 DIAGNOSIS — Z79899 Other long term (current) drug therapy: Secondary | ICD-10-CM | POA: Insufficient documentation

## 2022-10-07 DIAGNOSIS — E119 Type 2 diabetes mellitus without complications: Secondary | ICD-10-CM | POA: Insufficient documentation

## 2022-10-07 DIAGNOSIS — Z6838 Body mass index (BMI) 38.0-38.9, adult: Secondary | ICD-10-CM

## 2022-10-07 DIAGNOSIS — I483 Typical atrial flutter: Secondary | ICD-10-CM

## 2022-10-07 HISTORY — PX: CARDIOVERSION: SHX1299

## 2022-10-07 LAB — GLUCOSE, CAPILLARY: Glucose-Capillary: 328 mg/dL — ABNORMAL HIGH (ref 70–99)

## 2022-10-07 SURGERY — CARDIOVERSION
Anesthesia: General

## 2022-10-07 MED ORDER — PROPOFOL 10 MG/ML IV BOLUS
INTRAVENOUS | Status: DC | PRN
Start: 2022-10-07 — End: 2022-10-07
  Administered 2022-10-07: 150 mg via INTRAVENOUS
  Administered 2022-10-07: 30 mg via INTRAVENOUS
  Administered 2022-10-07: 40 mg via INTRAVENOUS
  Administered 2022-10-07: 100 mg via INTRAVENOUS

## 2022-10-07 MED ORDER — LIDOCAINE 2% (20 MG/ML) 5 ML SYRINGE
INTRAMUSCULAR | Status: DC | PRN
  Administered 2022-10-07: 60 mg via INTRAVENOUS

## 2022-10-07 MED ORDER — SODIUM CHLORIDE 0.9 % IV SOLN: INTRAVENOUS | Status: DC

## 2022-10-07 SURGICAL SUPPLY — 1 items: ELECT DEFIB PAD ADLT CADENCE (PAD) ×1 IMPLANT

## 2022-10-07 NOTE — Anesthesia Postprocedure Evaluation (Signed)
Anesthesia Post Note  Patient: Katie Hampton  Procedure(s) Performed: CARDIOVERSION     Patient location during evaluation: PACU Anesthesia Type: General Level of consciousness: awake and alert Pain management: pain level controlled Vital Signs Assessment: post-procedure vital signs reviewed and stable Respiratory status: spontaneous breathing, nonlabored ventilation and respiratory function stable Cardiovascular status: blood pressure returned to baseline and stable Postop Assessment: no apparent nausea or vomiting Anesthetic complications: no   There were no known notable events for this encounter.  Last Vitals:  Vitals:   10/07/22 1400 10/07/22 1410  BP: (!) 94/55 98/62  Pulse: 66 67  Resp: 15 15  Temp:  36.7 C  SpO2: 96% 97%    Last Pain:  Vitals:   10/07/22 1410  TempSrc: Temporal  PainSc: 0-No pain                 Yen Wandell

## 2022-10-07 NOTE — Anesthesia Preprocedure Evaluation (Addendum)
Anesthesia Evaluation  Patient identified by MRN, date of birth, ID band Patient awake    Reviewed: Allergy & Precautions, NPO status , Patient's Chart, lab work & pertinent test results, reviewed documented beta blocker date and time   History of Anesthesia Complications Negative for: history of anesthetic complications  Airway Mallampati: III  TM Distance: >3 FB Neck ROM: Full    Dental  (+) Dental Advisory Given, Teeth Intact   Pulmonary former smoker   breath sounds clear to auscultation       Cardiovascular + dysrhythmias Atrial Fibrillation  Rhythm:Irregular  1. Left ventricular ejection fraction, by estimation, is 60 to 65%. The  left ventricle has normal function. The left ventricle has no regional  wall motion abnormalities.   2. Right ventricular systolic function is normal. The right ventricular  size is normal.   3. No left atrial/left atrial appendage thrombus was detected.   4. The mitral valve is normal in structure. No evidence of mitral valve  regurgitation.   5. The aortic valve is tricuspid. Aortic valve regurgitation is not  visualized.     Neuro/Psych neg Seizures  Neuromuscular disease  negative psych ROS   GI/Hepatic negative GI ROS, Neg liver ROS,,,  Endo/Other  diabetes, Insulin Dependent  Lab Results      Component                Value               Date                      HGBA1C                   7.4 (H)             05/25/2022             Renal/GU negative Renal ROSLab Results      Component                Value               Date                      CREATININE               0.78                09/27/2022                Musculoskeletal negative musculoskeletal ROS (+)    Abdominal   Peds  Hematology  (+) Blood dyscrasia Lab Results      Component                Value               Date                      WBC                      8.6                 09/27/2022                HGB                       14.9  09/27/2022                HCT                      42.0                09/27/2022                MCV                      92.3                09/27/2022                PLT                      345                 09/27/2022            eliquis   Anesthesia Other Findings   Reproductive/Obstetrics Lab Results      Component                Value               Date                      PREGTESTUR               NEGATIVE            05/24/2022                HCG                      <5.0                07/30/2015                                         Anesthesia Physical Anesthesia Plan  ASA: 2  Anesthesia Plan: General   Post-op Pain Management: Minimal or no pain anticipated   Induction: Intravenous  PONV Risk Score and Plan: 3 and Treatment may vary due to age or medical condition  Airway Management Planned: Mask, Nasal Cannula, Natural Airway and Simple Face Mask  Additional Equipment: None  Intra-op Plan:   Post-operative Plan:   Informed Consent: I have reviewed the patients History and Physical, chart, labs and discussed the procedure including the risks, benefits and alternatives for the proposed anesthesia with the patient or authorized representative who has indicated his/her understanding and acceptance.     Dental advisory given  Plan Discussed with: CRNA  Anesthesia Plan Comments:         Anesthesia Quick Evaluation

## 2022-10-07 NOTE — Anesthesia Procedure Notes (Signed)
Procedure Name: MAC Date/Time: 10/07/2022 1:32 PM  Performed by: Aundria Rud, CRNAPre-anesthesia Checklist: Patient identified, Emergency Drugs available, Suction available, Patient being monitored and Timeout performed Patient Re-evaluated:Patient Re-evaluated prior to induction Oxygen Delivery Method: Nasal cannula Preoxygenation: Pre-oxygenation with 100% oxygen Induction Type: IV induction Placement Confirmation: positive ETCO2 and CO2 detector Dental Injury: Teeth and Oropharynx as per pre-operative assessment

## 2022-10-07 NOTE — Transfer of Care (Signed)
Immediate Anesthesia Transfer of Care Note  Patient: Katie Hampton  Procedure(s) Performed: CARDIOVERSION  Patient Location: Cath Lab  Anesthesia Type:MAC  Level of Consciousness: awake, drowsy, and patient cooperative  Airway & Oxygen Therapy: Patient Spontanous Breathing and Patient connected to nasal cannula oxygen  Post-op Assessment: Report given to RN, Post -op Vital signs reviewed and stable, and Patient moving all extremities X 4  Post vital signs: Reviewed and stable  Last Vitals:  Vitals Value Taken Time  BP 114/84   Temp    Pulse 87   Resp 27   SpO2 97     Last Pain:  Vitals:   10/07/22 1314  TempSrc: Temporal  PainSc:          Complications: No notable events documented.

## 2022-10-07 NOTE — Interval H&P Note (Signed)
History and Physical Interval Note:  10/07/2022 11:28 AM  Katie Hampton  has presented today for surgery, with the diagnosis of AFIB & AFLUTTER.  The various methods of treatment have been discussed with the patient and family. After consideration of risks, benefits and other options for treatment, the patient has consented to  Procedure(s): CARDIOVERSION (N/A) as a surgical intervention.  The patient's history has been reviewed, patient examined, no change in status, stable for surgery.  I have reviewed the patient's chart and labs.  Questions were answered to the patient's satisfaction.     Herberto Ledwell [employer]

## 2022-10-08 ENCOUNTER — Encounter (HOSPITAL_COMMUNITY): Payer: Self-pay | Admitting: Cardiology

## 2022-10-10 ENCOUNTER — Ambulatory Visit: Payer: Medicare Other | Admitting: Medical

## 2022-10-10 NOTE — Progress Notes (Signed)
Cardiology Office Note Date:  10/10/2022  Patient ID:  Katie Hampton, DOB 08-22-1980, MRN [id number] PCP:  [facility]  Cardiologist:  None Electrophysiologist: Lanier Prude, MD    Chief Complaint: afib  History of Present Illness: Katie Hampton is a 42 y.o. female with PMH notable for Aflutter, Atach, AFib, T2DM; seen today for Lanier Prude, MD for routine EP follow-up.  She was first diagnosed with A-fib 05/2022 while hospitalized due to sepsis from a right renal abscess.  Was started on flecainide and metoprolol at that time.  No anticoagulation due to low CHA2DS2-VASc and active infection.   Last seen by Dr. Lalla Brothers 07/2022.  She had run out of the medication and had noticed a significant increase in A-fib burden.  She was scheduled for an ablation and restarted flecainide. During ETT, she had significant increase in her QRS duration and so flecainide was stopped. At follow-up, she started multaq to avoid her exposure to amiodarone given young age.  I saw her 3 weeks ago, had briefly run out of multaq and was in aflutter with dizziness and DOE, increased fatigue. She was started on eliquis at that time and scheduled for TEE/DCCV. This was performed on 7/15 without any sinus beats. Repeat CV on 7/22 with 360J and sternal pressure with 5-10 sinus beats, then resumed atrial flutter.   On follow-up today, patient states that she still fatigued, though is maybe improving some. She was told to stop multaq after DCCV earlier this week, and so has stopped.  She has continued eliquis BID without missing doses, no bleeding concerns.   Denies chest pain, chest pressure. No increased edema  AAD History: Flecainide - stopped d/t prolonged QRS Multaq   Past Medical History:  Diagnosis Date   Diabetes mellitus    Pyelonephritis     Past Surgical History:  Procedure Laterality Date   CARDIOVERSION N/A 09/30/2022   Procedure: CARDIOVERSION;  Surgeon: Iran Ouch, MD;  Location: [facility] ORS;  Service: Cardiovascular;  Laterality: N/A;   CARDIOVERSION N/A 10/07/2022   Procedure: CARDIOVERSION;  Surgeon: Regan Lemming, MD;  Location: [facility] INVASIVE CV LAB;  Service: Cardiovascular;  Laterality: N/A;   CHOLECYSTECTOMY     EYE SURGERY Bilateral    TUBAL LIGATION Bilateral     Current Outpatient Medications  Medication Instructions   amitriptyline (ELAVIL) 25 mg, Oral, Daily at bedtime   apixaban (ELIQUIS) 5 mg, Oral, 2 times daily   atorvastatin (LIPITOR) 20 mg, Oral, Daily at bedtime   BD INSULIN SYRINGE U/F 31G X 5/16" 1 ML MISC USE 1 (ONE) SYRINGE 3-4 TIMES DAILY   fenofibrate micronized (LOFIBRA) 200 mg, Oral, Daily   gabapentin (NEURONTIN) 600 mg, Oral, Daily at bedtime   ibuprofen (ADVIL) 800 mg, Oral, 3 times daily PRN   insulin NPH Human (NOVOLIN N) 18-22 Units, Subcutaneous, 2 times daily before meals, Per patients sliding scale: breakfast - 20-22 units,  supper 18-20 units   insulin regular (NOVOLIN R) 18-24 Units, Subcutaneous, 2 times daily before meals, Per patients sliding scale: breakfast 20-24 units, supper 18-20   LUTEIN PO 1 capsule, Oral, 2 times weekly   metoprolol tartrate (LOPRESSOR) 50 mg, Oral, 2 times daily   SUMAtriptan (IMITREX) 100 mg, Oral, Every 2 hours PRN   Vitamin D (Ergocalciferol) (DRISDOL) 50,000 Units, Oral, Weekly    Social History:  The patient  reports that she has quit smoking. Her smoking use included cigarettes. She has never used smokeless tobacco. She reports current  alcohol use. She reports that she does not use drugs.   Family History:  The patient's family history includes Colon cancer in her maternal aunt; Kidney disease in her sister.  ROS:  Please see the history of present illness. All other systems are reviewed and otherwise negative.   PHYSICAL EXAM:  VS:  LMP 09/19/2022 Comment: Tubaligation 2008 BMI: There is no height or weight on file to calculate BMI.  GEN- The patient is well  appearing, alert and oriented x 3 today.   Lungs- Clear to ausculation bilaterally, normal work of breathing.  Heart- Regular rate and rhythm, no murmurs, rubs or gallops Extremities- No peripheral edema, warm, dry  EKG is ordered. Personal review of EKG from today shows: EKG Interpretation Date/Time:  Friday October 11 2022 10:57:11 EDT Ventricular Rate:  91 PR Interval:  188 QRS Duration:  88 QT Interval:  360 QTC Calculation: 442 R Axis:   -6  Text Interpretation: Normal sinus rhythm Normal ECG When compared with ECG of 07-Oct-2022 13:49, Sinus rhythm has replaced Atrial flutter Confirmed by Sherie Don [phone]) on 10/11/2022 11:10:38 AM    Recent Labs: 05/24/2022: TSH 1.001 06/13/2022: ALT 16 09/27/2022: BUN 10; Creatinine, Ser 0.78; Hemoglobin 14.9; Platelets 345; Potassium 3.7; Sodium 138  No results found for requested labs within last 365 days.   Estimated Creatinine Clearance: 105.4 mL/min (by C-G formula based on SCr of 0.78 mg/dL).   Wt Readings from Last 3 Encounters:  10/07/22 220 lb (99.8 kg)  09/30/22 220 lb (99.8 kg)  09/27/22 221 lb (100.2 kg)     Additional studies reviewed include: Previous EP, cardiology notes.   Exercise treadmill study, 07/26/2022 Target heart rate achieved Increase in QRS at peak exercise with normalization in recovery  Long term monitor, 05/31/2022   Normal sinus rhythm predominantly ranging between 43 and 124 bpm with average HR 68 bpm   <1% Atrial flutter burden ranging from 52-124 bpm (avg 96 bpm), longest lasting 4 minute 31 seconds   No evidence of AFib or ventricular arrythmias   No evidence of AV blocks or pauses   4.5% PAC burden and 0% PVC burden   Patient triggered events correlate with AFlutter, NSR and PAC  TTE, 05/26/2022  1. Left ventricular ejection fraction, by estimation, is 60 to 65%. The left ventricle has normal function. The left ventricle has no regional wall motion abnormalities. Left ventricular diastolic  parameters are indeterminate.   2. Right ventricular systolic function is normal. The right ventricular size is normal. There is normal pulmonary artery systolic pressure. The estimated right ventricular systolic pressure is 26.0 mmHg.   3. The mitral valve is normal in structure. No evidence of mitral valve regurgitation. No evidence of mitral stenosis.   4. The aortic valve has an indeterminant number of cusps. Aortic valve regurgitation is not visualized. No aortic stenosis is present.   5. The inferior vena cava is normal in size with greater than 50% respiratory variability, suggesting right atrial pressure of 3 mmHg.   6. There is a very small patent foramen ovale with minimal left to right shunting across the atrial septum.    ASSESSMENT AND PLAN:  #) parox AFib #) typical aflutter AF ablation scheduled w Dr.Lambert S/p DCCV x 2 with multaq  Has converted to NSR today, unclear when she converted Restart multaq 400mg  BID   #) Hypercoag d/t atrial flutter CHA2DS2-VASc Score = 2 [CHF History: 0, HTN History: 0, Diabetes History: 1, Stroke History: 0, Vascular Disease History:  0, Age Score: 0, Gender Score: 1].  Therefore, the patient's annual risk of stroke is 2.2 %. Stroke ppx - 5mg  eliquis BID, appropriately dosed No bleeding concerns Will continue eliquis at this time uninterupted given recent CV and upcoming AF ablation   Current medicines are reviewed at length with the patient today.   The patient does not have concerns regarding her medicines.  The following changes were made today:   Restart 400mg  multaq BID   Labs/ tests ordered today include:  Orders Placed This Encounter  Procedures   EKG 12-Lead     Disposition: Follow up with Dr. Lalla Brothers for AF ablation, will discuss whether pre-procedure appt is needed   Signed, Sherie Don, NP  10/10/22  1:52 PM  Electrophysiology [facility]

## 2022-10-11 ENCOUNTER — Encounter: Payer: Self-pay | Admitting: Cardiology

## 2022-10-11 ENCOUNTER — Ambulatory Visit: Payer: Medicare Other | Attending: Cardiology | Admitting: Cardiology

## 2022-10-11 VITALS — BP 114/76 | HR 91 | Ht 63.0 in | Wt 221.8 lb

## 2022-10-11 DIAGNOSIS — D6869 Other thrombophilia: Secondary | ICD-10-CM | POA: Diagnosis not present

## 2022-10-11 DIAGNOSIS — I483 Typical atrial flutter: Secondary | ICD-10-CM

## 2022-10-11 DIAGNOSIS — I480 Paroxysmal atrial fibrillation: Secondary | ICD-10-CM

## 2022-10-11 MED ORDER — DRONEDARONE HCL 400 MG PO TABS: 400.0000 mg | ORAL_TABLET | Freq: Two times a day (BID) | ORAL | Status: DC

## 2022-10-11 NOTE — Patient Instructions (Addendum)
Medication Instructions:  RESTART Multaq 400 mg by mouth twice a day  *If you need a refill on your cardiac medications before your next appointment, please call your pharmacy*   Lab Work: No labs ordered  If you have labs (blood work) drawn today and your tests are completely normal, you will receive your results only by: MyChart Message (if you have MyChart) OR A paper copy in the mail If you have any lab test that is abnormal or we need to change your treatment, we will call you to review the results.  Testing/Procedures: No testing ordered  Follow-Up: At [facility], you and your health needs are our priority.  As part of our continuing mission to provide you with exceptional heart care, we have created designated Provider Care Teams.  These Care Teams include your primary Cardiologist (physician) and Advanced Practice Providers (APPs -  Physician Assistants and Nurse Practitioners) who all work together to provide you with the care you need, when you need it.  We recommend signing up for the patient portal called "MyChart".  Sign up information is provided on this After Visit Summary.  MyChart is used to connect with patients for Virtual Visits (Telemedicine).  Patients are able to view lab/test results, encounter notes, upcoming appointments, etc.  Non-urgent messages can be sent to your provider as well.   To learn more about what you can do with MyChart, go to [email or web address].    Your next appointment:   To be determined   Provider:   Steffanie Dunn, MD or Sherie Don, NP

## 2022-10-12 ENCOUNTER — Encounter (HOSPITAL_COMMUNITY): Payer: Self-pay | Admitting: Emergency Medicine

## 2022-10-12 ENCOUNTER — Other Ambulatory Visit: Payer: Self-pay

## 2022-10-12 ENCOUNTER — Emergency Department (HOSPITAL_COMMUNITY)
Admission: EM | Admit: 2022-10-12 | Discharge: 2022-10-12 | Disposition: A | Discharge status: Home / Self Care | Payer: Medicare Other | Source: Home / Self Care | Attending: Emergency Medicine | Admitting: Emergency Medicine

## 2022-10-12 DIAGNOSIS — Z7984 Long term (current) use of oral hypoglycemic drugs: Secondary | ICD-10-CM | POA: Insufficient documentation

## 2022-10-12 DIAGNOSIS — Z7901 Long term (current) use of anticoagulants: Secondary | ICD-10-CM | POA: Insufficient documentation

## 2022-10-12 DIAGNOSIS — R42 Dizziness and giddiness: Secondary | ICD-10-CM | POA: Diagnosis not present

## 2022-10-12 DIAGNOSIS — E119 Type 2 diabetes mellitus without complications: Secondary | ICD-10-CM | POA: Diagnosis not present

## 2022-10-12 DIAGNOSIS — R000 Tachycardia, unspecified: Secondary | ICD-10-CM | POA: Diagnosis not present

## 2022-10-12 DIAGNOSIS — Z794 Long term (current) use of insulin: Secondary | ICD-10-CM | POA: Diagnosis not present

## 2022-10-12 DIAGNOSIS — R0602 Shortness of breath: Secondary | ICD-10-CM | POA: Diagnosis not present

## 2022-10-12 LAB — URINALYSIS, ROUTINE W REFLEX MICROSCOPIC
Bilirubin Urine: NEGATIVE
Glucose, UA: 50 mg/dL — AB
Hgb urine dipstick: NEGATIVE
Ketones, ur: NEGATIVE mg/dL
Nitrite: NEGATIVE
Protein, ur: 100 mg/dL — AB
Specific Gravity, Urine: 1.021 (ref 1.005–1.030)
pH: 5 (ref 5.0–8.0)

## 2022-10-12 LAB — CBC
HCT: 44.6 % (ref 36.0–46.0)
Hemoglobin: 15.9 g/dL — ABNORMAL HIGH (ref 12.0–15.0)
MCH: 34 pg (ref 26.0–34.0)
MCHC: 35.7 g/dL (ref 30.0–36.0)
MCV: 95.5 fL (ref 80.0–100.0)
Platelets: 413 10*3/uL — ABNORMAL HIGH (ref 150–400)
RBC: 4.67 MIL/uL (ref 3.87–5.11)
RDW: 13.1 % (ref 11.5–15.5)
WBC: 11.6 10*3/uL — ABNORMAL HIGH (ref 4.0–10.5)
nRBC: 0 % (ref 0.0–0.2)

## 2022-10-12 LAB — BASIC METABOLIC PANEL
Anion gap: 14 (ref 5–15)
BUN: 8 mg/dL (ref 6–20)
CO2: 18 mmol/L — ABNORMAL LOW (ref 22–32)
Calcium: 9.2 mg/dL (ref 8.9–10.3)
Chloride: 102 mmol/L (ref 98–111)
Creatinine, Ser: 0.92 mg/dL (ref 0.44–1.00)
GFR, Estimated: >60 mL/min (ref >60)
Glucose, Bld: 100 mg/dL — ABNORMAL HIGH (ref 70–99)
Potassium: 3.6 mmol/L (ref 3.5–5.1)
Sodium: 134 mmol/L — ABNORMAL LOW (ref 135–145)

## 2022-10-12 LAB — TROPONIN I (HIGH SENSITIVITY): Troponin I (High Sensitivity): <2 ng/L (ref <18)

## 2022-10-12 LAB — MAGNESIUM: Magnesium: 1.9 mg/dL (ref 1.7–2.4)

## 2022-10-12 NOTE — ED Provider Notes (Signed)
[place] EMERGENCY DEPARTMENT AT [facility] Provider Note   CSN: [id number] Arrival date & time: 10/12/22  1004     History  Chief Complaint  Patient presents with   Shortness of Breath   Dizziness    Katie Hampton is a 42 y.o. female with a past medical history of atrial flutter, atrial tachycardia, atrial fibrillation,Left eye blindness,  type 2 diabetes with anticoagulation on Eliquis who presents to the emergency department with dizziness and shortness of breath.  Patient has a history of multiple previous cardioversions.  She had 2 over the last week.  Since that time she has been experiencing intermittent episodes of dizziness and near syncope along with shortness of breath.  Patient takes metoprolol and Eliquis 5 mg twice daily.  She was seen yesterday and started back on her Multaq.  Patient states when she woke this morning she felt dizzy.  Her heart was pounding and her Apple Watch said that her heart rate was 167.  Patient states that she felt lightheaded like she might pass out but also felt a little off balance and felt a little bit like there was some room spinning.  She states that her husband stood by her to make sure she would not fall and eventually her heart rate decreased and she felt better.  She states that this highly concerned her.  She believes that she is going in and out of atrial fibrillation.   Shortness of Breath Dizziness Associated symptoms: shortness of breath        Home Medications Prior to Admission medications   Medication Sig Start Date End Date Taking? Authorizing Provider  amitriptyline (ELAVIL) 25 MG tablet Take 1 tablet (25 mg total) by mouth at bedtime. 08/22/22   Sondra Come, MD  apixaban (ELIQUIS) 5 MG TABS tablet Take 1 tablet (5 mg total) by mouth 2 (two) times daily. 09/27/22   Sherie Don, NP  atorvastatin (LIPITOR) 20 MG tablet Take 20 mg by mouth at bedtime. 08/22/22   [provider]  BD INSULIN SYRINGE  U/F 31G X 5/16" 1 ML MISC USE 1 (ONE) SYRINGE 3-4 TIMES DAILY 06/24/22   [provider]  dronedarone (MULTAQ) 400 MG tablet Take 1 tablet (400 mg total) by mouth 2 (two) times daily with a meal. 10/11/22   Sherie Don, NP  fenofibrate micronized (LOFIBRA) 200 MG capsule Take 200 mg by mouth daily. 05/03/22   [provider]  gabapentin (NEURONTIN) 600 MG tablet Take 600 mg by mouth at bedtime. 01/30/21   [provider]  ibuprofen (ADVIL) 800 MG tablet Take 800 mg by mouth 3 (three) times daily as needed for moderate pain. 05/03/22   [provider]  insulin NPH (HUMULIN N,NOVOLIN N) 100 UNIT/ML injection Inject 18-22 Units into the skin 2 (two) times daily before a meal. Per patients sliding scale: breakfast - 20-22 units,  supper 18-20 units    [provider]  insulin regular (NOVOLIN R,HUMULIN R) 100 units/mL injection Inject 18-24 Units into the skin 2 (two) times daily before a meal. Per patients sliding scale: breakfast 20-24 units, supper 18-20    [provider]  LUTEIN PO Take 1 capsule by mouth 2 (two) times a week.    [provider]  metoprolol tartrate (LOPRESSOR) 50 MG tablet Take 50 mg by mouth 2 (two) times daily.    [provider]  SUMAtriptan (IMITREX) 100 MG tablet Take 100 mg by mouth every 2 (two) hours as needed  for migraine. 05/03/22   [provider]  Vitamin D, Ergocalciferol, (DRISDOL) 1.25 MG (50000 UNIT) CAPS capsule Take 50,000 Units by mouth once a week. 05/03/22   [provider]      Allergies    Flecainide    Review of Systems   Review of Systems  Respiratory:  Positive for shortness of breath.   Neurological:  Positive for dizziness.    Physical Exam Updated Vital Signs BP (!) 140/108 (BP Location: Right Arm)   Pulse 92   Temp 97.6 F (36.4 C)   Resp 19   Ht 5\' 3"  (1.6 m)   Wt 100 kg   LMP 09/19/2022 Comment: Tubaligation 2008  SpO2 100%   BMI 39.05 kg/m   Physical Exam Vitals and nursing note reviewed.  Constitutional:      General: She is not in acute distress.    Appearance: She is well-developed. She is not diaphoretic.  HENT:     Head: Normocephalic and atraumatic.     Right Ear: External ear normal.     Left Ear: External ear normal.     Nose: Nose normal.     Mouth/Throat:     Mouth: Mucous membranes are moist.  Eyes:     General: No scleral icterus.    Conjunctiva/sclera: Conjunctivae normal.  Cardiovascular:     Rate and Rhythm: Normal rate and regular rhythm.     Heart sounds: Normal heart sounds. No murmur heard.    No friction rub. No gallop.  Pulmonary:     Effort: Pulmonary effort is normal. No respiratory distress.     Breath sounds: Normal breath sounds.  Abdominal:     General: Bowel sounds are normal. There is no distension.     Palpations: Abdomen is soft. There is no mass.     Tenderness: There is no abdominal tenderness. There is no guarding.  Musculoskeletal:     Cervical back: Normal range of motion.     Right lower leg: Edema present.  Skin:    General: Skin is warm and dry.  Neurological:     Mental Status: She is alert and oriented to person, place, and time.     GCS: GCS eye subscore is 4. GCS verbal subscore is 5. GCS motor subscore is 6.     Cranial Nerves: No cranial nerve deficit, dysarthria or facial asymmetry.     Sensory: Sensation is intact.     Motor: Motor function is intact.     Coordination: Coordination is intact.  Psychiatric:        Mood and Affect: Mood is anxious.        Behavior: Behavior normal.     ED Results / Procedures / Treatments   Labs (all labs ordered are listed, but only abnormal results are displayed) Labs Reviewed  BASIC METABOLIC PANEL - Abnormal; Notable for the following components:      Result Value   Sodium 134 (*)    CO2 18 (*)    Glucose, Bld 100 (*)    All other components within normal limits  URINALYSIS, ROUTINE W REFLEX MICROSCOPIC - Abnormal;  Notable for the following components:   Color, Urine AMBER (*)    APPearance HAZY (*)    Glucose, UA 50 (*)    Protein, ur 100 (*)    Leukocytes,Ua TRACE (*)    Bacteria, UA RARE (*)    All other components within normal limits  CBC  PREGNANCY, URINE  CBG MONITORING, ED  TROPONIN I (HIGH SENSITIVITY)    EKG None  Radiology No results found.  Procedures Procedures    Medications Ordered in ED Medications - No data to display  ED Course/ Medical Decision Making/ A&P     CHA2DS2-VASc Score = 2 [CHF History: 0, HTN History: 0, Diabetes History: 1, Stroke History: 0, Vascular Disease History: 0, Age Score: 0, Gender Score: 1]                          Medical Decision Making Amount and/or Complexity of Data Reviewed Labs: ordered.   This patient presents to the ED for concern of racing heart and lightheadedness, this involves an extensive number of treatment options, and is a complaint that carries with it a high risk of complications and morbidity.  The differential diagnosis for palpitations includes cardiac arrhythmias, PVC/PAC, ACS, Cardiomyopathy, CHF, MVP, pericarditis, valvular disease, Panic/Anxiety, Somatic disorder, ETOH, Caffeine,  Stimulant use, medication side effect, Anemia, Hyperthyroidism, pulmonary embolism.   Co morbidities: as per the HPI  Social Determinants of Health:   SDOH Screenings   Food Insecurity: No Food Insecurity (05/24/2022)  Housing: Low Risk  (05/24/2022)  Transportation Needs: No Transportation Needs (05/24/2022)  Utilities: Not At Risk (05/24/2022)  Depression (PHQ2-9): Low Risk  (06/13/2022)  Tobacco Use: Medium Risk (10/12/2022)     Additional history:  {Additional history obtained from EMR, husband {External records from outside source obtained and reviewed including cardilogy notes  Lab Tests:  I Ordered, and personally interpreted labs.  The pertinent results include:    I reviewed patient's labs.  BMP shows mildly elevated  glucose, urine appears contaminated.  CBC without significant abnormality.  Magnesium within normal limits, troponin within normal limits. Imaging Studies:   Cardiac Monitoring/ECG:  The patient was maintained on a cardiac monitor.  I personally viewed and interpreted the cardiac monitored which showed an underlying rhythm of: Sinus rhythm  Medicines ordered and prescription drug management:   Test Considered:   considered CT angiogram of the head and neck given patient's recent cardioversions.  She has remained anticoagulated and has a completely normal neurologic examination.  Doubt TIA or CVA.  Critical Interventions:    Consultations Obtained:   Problem List / ED Course:     ICD-10-CM   1. Racing heart beat  R00.0     2. Light headedness  R42       MDM: Patient seen in shared visit with Dr. Rhunette Croft.  Patient has a history of paroxysmal atrial fibrillation, atrial tachycardia and a flutter.  Her EKG is within normal limits here.  She has a normal neurologic exam.  She states her Apple Watch said that her heart rate was about 170 this morning.  I have advised that patient also manually check her pulse to compare it with her Apple Watch.  Suspect she is probably still having episodes of paroxysmal atrial fibrillation however she does not appear to be in A-fib with RVR.  I have extremely low suspicion for stroke or CVA.  She does not appear to be volume down.  She is not having any ataxia or difficulty with ambulation.  No dizziness chest pressure or pain at this time.  Will have her patient follow-up in the outpatient setting with cardiology.  Discussed outpatient follow-up and return precautions.    Dispostion:  After consideration of the diagnostic results and the patients response to treatment, I feel that the patent would benefit from discharge.  Final Clinical Impression(s) / ED Diagnoses Final diagnoses:  Racing heart beat  Light headedness    Rx /  DC Orders ED Discharge Orders     None         Arthor Captain, PA-C 10/12/22 1410    Derwood Kaplan, MD 10/13/22 2042

## 2022-10-12 NOTE — ED Triage Notes (Signed)
Pt reports waking this morning feeling dizzy, SOB, weak seen by heart doctor yesterday told she wasn't in afib. Pt has had 2 cardioversions in the past week for afib. Today states symptoms feel worse and well has having bilateral arm pain. Denies recent cough /fever. Taking eliquis.

## 2022-10-12 NOTE — ED Notes (Signed)
After on hold and called back a 4th time lab is able to add on the magnesium

## 2022-10-12 NOTE — ED Notes (Signed)
Attempted to call lab for add on magnesium no answer x 2

## 2022-10-12 NOTE — Discharge Instructions (Signed)
Contact a health care provider if: You have vomiting or diarrhea that does not go away. You have a fever. You have weakness or dizziness. You feel faint. Get help right away if: You have pain in your chest, upper arms, jaw, or neck. You have palpitations that do not go away. 

## 2022-10-18 ENCOUNTER — Ambulatory Visit: Payer: Medicare Other | Admitting: Physician Assistant

## 2022-10-19 ENCOUNTER — Other Ambulatory Visit: Payer: Self-pay | Admitting: Urology

## 2022-10-24 ENCOUNTER — Other Ambulatory Visit: Payer: Self-pay | Admitting: *Deleted

## 2022-10-24 MED ORDER — DRONEDARONE HCL 400 MG PO TABS: 400.0000 mg | ORAL_TABLET | Freq: Two times a day (BID) | ORAL | 0 refills | Status: DC

## 2022-10-25 ENCOUNTER — Encounter: Payer: Self-pay | Admitting: Physician Assistant

## 2022-10-25 ENCOUNTER — Ambulatory Visit: Payer: Medicare Other | Admitting: Physician Assistant

## 2022-10-25 VITALS — BP 127/80 | HR 83 | Wt 223.2 lb

## 2022-10-25 DIAGNOSIS — R300 Dysuria: Secondary | ICD-10-CM | POA: Diagnosis not present

## 2022-10-25 MED ORDER — AMITRIPTYLINE HCL 50 MG PO TABS
50.0000 mg | ORAL_TABLET | Freq: Every day | ORAL | 3 refills | Status: DC
Start: 2022-10-25 — End: 2023-02-10

## 2022-10-25 NOTE — Progress Notes (Signed)
10/25/2022 10:01 AM   Katie Hampton 11-Jan-1981 [id number]  CC: Chief Complaint  Patient presents with   Follow-up   HPI: Katie Hampton is a 42 y.o. female with PMH DM1, chronic dysuria with normal cystoscopy with Dr. Richardo Hanks in June 2024, and recurrent UTI who presents today for recheck on amitriptyline 25 mg.   Today she reports her dysuria has improved on amitriptyline, but not fully resolved.  She has not been treated for any UTIs since her last office visit with [place].  She has no acute concerns today.  She reports increased vaginal discharge last month that was not associated with pruritus or odor.  This resolved on its own.  She also reports bilateral flank pain, though recent renal ultrasound was normal.  PMH: Past Medical History:  Diagnosis Date   Diabetes mellitus    Type 1   Pyelonephritis     Surgical History: Past Surgical History:  Procedure Laterality Date   CARDIOVERSION N/A 09/30/2022   Procedure: CARDIOVERSION;  Surgeon: Iran Ouch, MD;  Location: [facility] ORS;  Service: Cardiovascular;  Laterality: N/A;   CARDIOVERSION N/A 10/07/2022   Procedure: CARDIOVERSION;  Surgeon: Regan Lemming, MD;  Location: [facility] INVASIVE CV LAB;  Service: Cardiovascular;  Laterality: N/A;   CHOLECYSTECTOMY     EYE SURGERY Bilateral    TUBAL LIGATION Bilateral     Home Medications:  Allergies as of 10/25/2022       Reactions   Flecainide Other (See Comments)   Prolonged QTC        Medication List        Accurate as of October 25, 2022 10:01 AM. If you have any questions, ask your nurse or doctor.          amitriptyline 50 MG tablet Commonly known as: ELAVIL Take 1 tablet (50 mg total) by mouth at bedtime. What changed:  medication strength See the new instructions. Changed by: Carman Ching   apixaban 5 MG Tabs tablet Commonly known as: Eliquis Take 1 tablet (5 mg total) by mouth 2 (two) times daily.   atorvastatin 20 MG  tablet Commonly known as: LIPITOR Take 20 mg by mouth at bedtime.   BD Insulin Syringe U/F 31G X 5/16" 1 ML Misc Generic drug: Insulin Syringe-Needle U-100 USE 1 (ONE) SYRINGE 3-4 TIMES DAILY   dronedarone 400 MG tablet Commonly known as: MULTAQ Take 1 tablet (400 mg total) by mouth 2 (two) times daily with a meal.   fenofibrate micronized 200 MG capsule Commonly known as: LOFIBRA Take 200 mg by mouth daily.   gabapentin 600 MG tablet Commonly known as: NEURONTIN Take 600 mg by mouth at bedtime.   ibuprofen 800 MG tablet Commonly known as: ADVIL Take 800 mg by mouth 3 (three) times daily as needed for moderate pain.   insulin NPH Human 100 UNIT/ML injection Commonly known as: NOVOLIN N Inject 18-22 Units into the skin 2 (two) times daily before a meal. Per patients sliding scale: breakfast - 20-22 units,  supper 18-20 units   insulin regular 100 units/mL injection Commonly known as: NOVOLIN R Inject 18-24 Units into the skin 2 (two) times daily before a meal. Per patients sliding scale: breakfast 20-24 units, supper 18-20   LUTEIN PO Take 1 capsule by mouth 2 (two) times a week.   metoprolol tartrate 50 MG tablet Commonly known as: LOPRESSOR Take 50 mg by mouth 2 (two) times daily.   SUMAtriptan 100 MG tablet Commonly known as: IMITREX Take 100 mg  by mouth every 2 (two) hours as needed for migraine.   Vitamin D (Ergocalciferol) 1.25 MG (50000 UNIT) Caps capsule Commonly known as: DRISDOL Take 50,000 Units by mouth once a week.        Allergies:  Allergies  Allergen Reactions   Flecainide Other (See Comments)    Prolonged QTC    Family History: Family History  Problem Relation Age of Onset   Kidney disease Sister    Colon cancer Maternal Aunt     Social History:   reports that she has quit smoking. Her smoking use included cigarettes. She has never used smokeless tobacco. She reports current alcohol use. She reports that she does not use  drugs.  Physical Exam: BP 127/80   Pulse 83   Wt 223 lb 3.2 oz (101.2 kg)   LMP 09/19/2022 Comment: Tubaligation 2008  BMI 39.54 kg/m   Constitutional:  Alert and oriented, no acute distress, nontoxic appearing HEENT: [place], AT Cardiovascular: No clubbing, cyanosis, or edema Respiratory: Normal respiratory effort, no increased work of breathing Skin: No rashes, bruises or suspicious lesions Neurologic: Grossly intact, no focal deficits, moving all 4 extremities Psychiatric: Normal mood and affect  Assessment & Plan:   1. Dysuria Chronic dysuria improved but not resolved on amitriptyline 25 mg daily.  We discussed increasing her dose to 50 mg, however she finds this too sedating we can go back to the 25 mg dose.  She is in agreement.  - amitriptyline (ELAVIL) 50 MG tablet; Take 1 tablet (50 mg total) by mouth at bedtime.  Dispense: 90 tablet; Refill: 3  Return in about 3 months (around 01/25/2023) for Symptom recheck .  Carman Ching, PA-C  [facility] [place] [address] [place], [place] [address] [phone]

## 2022-11-08 ENCOUNTER — Other Ambulatory Visit: Payer: Self-pay

## 2022-11-08 ENCOUNTER — Emergency Department
Admission: EM | Admit: 2022-11-08 | Discharge: 2022-11-08 | Disposition: A | Discharge status: Home / Self Care | Payer: Medicare Other | Source: Home / Self Care | Attending: Emergency Medicine | Admitting: Emergency Medicine

## 2022-11-08 DIAGNOSIS — E109 Type 1 diabetes mellitus without complications: Secondary | ICD-10-CM | POA: Insufficient documentation

## 2022-11-08 DIAGNOSIS — L0231 Cutaneous abscess of buttock: Secondary | ICD-10-CM | POA: Diagnosis present

## 2022-11-08 DIAGNOSIS — L03317 Cellulitis of buttock: Secondary | ICD-10-CM | POA: Diagnosis not present

## 2022-11-08 MED ORDER — DOXYCYCLINE HYCLATE 100 MG PO TABS
100.0000 mg | ORAL_TABLET | Freq: Two times a day (BID) | ORAL | 0 refills | Status: DC
Start: 2022-11-08 — End: 2023-01-06

## 2022-11-08 NOTE — ED Triage Notes (Signed)
Pt here with a possible abscess on her right but cheek. Pt states she went to UC yesterday and was told she has an infx there but was not given abx. Pt states the area is painful to touch but is not draining.

## 2022-11-08 NOTE — ED Provider Notes (Signed)
[facility] Provider Note    Event Date/Time   First MD Initiated Contact with Patient 11/08/22 [phone]     (approximate)   History   Abscess   HPI  Katie Hampton is a 42 y.o. female with history of type 1 diabetes presents emergency department with a reddened area on the right buttock.  Patient states there was a small place which is gotten bigger.  Went to urgent care and they did not give her an antibiotic.  States might have had a low-grade fever yesterday but unsure.  States no fever today.  Is just concerned as the area has gotten larger.      Physical Exam   Triage Vital Signs: ED Triage Vitals [11/08/22 0838]  Encounter Vitals Group     BP 116/73     Systolic BP Percentile      Diastolic BP Percentile      Pulse Rate 87     Resp 18     Temp 98 F (36.7 C)     Temp Source Oral     SpO2 97 %     Weight 223 lb 1.7 oz (101.2 kg)     Height 5\' 3"  (1.6 m)     Head Circumference      Peak Flow      Pain Score 3     Pain Loc      Pain Education      Exclude from Growth Chart     Most recent vital signs: Vitals:   11/08/22 0838  BP: 116/73  Pulse: 87  Resp: 18  Temp: 98 F (36.7 C)  SpO2: 97%     General: Awake, no distress.   CV:  Good peripheral perfusion. regular rate and  rhythm Resp:  Normal effort.  Abd:  No distention.   Other:  Skin with 3 x 3 reddened area, appears to have a insect bite in the center, no drainage, no fluctuance, neurovascular intact   ED Results / Procedures / Treatments   Labs (all labs ordered are listed, but only abnormal results are displayed) Labs Reviewed - No data to display   EKG     RADIOLOGY     PROCEDURES:   Procedures   MEDICATIONS ORDERED IN ED: Medications - No data to display   IMPRESSION / MDM / ASSESSMENT AND PLAN / ED COURSE  I reviewed the triage vital signs and the nursing notes.                              Differential diagnosis includes, but is not  limited to, cellulitis, infected insect bite, abscess  Patient's presentation is most consistent with acute, uncomplicated illness.   Area appears to be more cellulitic secondary to the insect bite than an abscess.  There is no induration, thickening or fluctuance.  Will place patient on doxycycline.  Patient is in agreement treatment plan.  She is discharged stable condition.  Strict instructions to return if worsening      FINAL CLINICAL IMPRESSION(S) / ED DIAGNOSES   Final diagnoses:  Cellulitis of right buttock     Rx / DC Orders   ED Discharge Orders          Ordered    doxycycline (VIBRA-TABS) 100 MG tablet  2 times daily        11/08/22 0923             Note:  This document was prepared using Dragon voice recognition software and may include unintentional dictation errors.    Faythe Ghee, PA-C 11/08/22 6387    Chesley Noon, MD 11/08/22 [phone]

## 2022-11-08 NOTE — Discharge Instructions (Signed)
Soak in warm water with Epsom salts.  Take the antibiotic as prescribed.  Return emergency department for areas worsening.  Check your glucose frequently to ensure it is not grossly elevating due to the infection

## 2022-11-15 ENCOUNTER — Ambulatory Visit: Admission: RE | Admit: 2022-11-15 | Payer: Medicare Other | Source: Ambulatory Visit

## 2022-11-19 ENCOUNTER — Ambulatory Visit
Admission: RE | Admit: 2022-11-19 | Discharge: 2022-11-19 | Disposition: A | Discharge status: Home / Self Care | Payer: Medicare Other | Source: Ambulatory Visit | Attending: Cardiology | Admitting: Cardiology

## 2022-11-19 DIAGNOSIS — I483 Typical atrial flutter: Secondary | ICD-10-CM | POA: Insufficient documentation

## 2022-11-19 DIAGNOSIS — I480 Paroxysmal atrial fibrillation: Secondary | ICD-10-CM | POA: Insufficient documentation

## 2022-11-19 DIAGNOSIS — Z79899 Other long term (current) drug therapy: Secondary | ICD-10-CM | POA: Insufficient documentation

## 2022-11-19 MED ORDER — IOHEXOL 350 MG/ML SOLN
100.0000 mL | Freq: Once | INTRAVENOUS | Status: AC | PRN
  Administered 2022-11-19: 100 mL via INTRAVENOUS

## 2022-11-19 MED ORDER — SODIUM CHLORIDE 0.9 % IV SOLN: INTRAVENOUS | Status: DC

## 2022-11-21 ENCOUNTER — Telehealth: Payer: Self-pay | Admitting: Cardiology

## 2022-11-21 NOTE — Telephone Encounter (Signed)
Patient is calling because she has an ablation scheduled for tomorrow morning. Patient is unable to have the ablation tomorrow due to her dad being at the hospital. Please advise.

## 2022-11-21 NOTE — Telephone Encounter (Signed)
Returned call to pt to let her know that I received her message and I have cancelled her procedure. LM for her to call me back when she's ready to reschedule.

## 2022-11-22 ENCOUNTER — Encounter (HOSPITAL_COMMUNITY): Admission: RE | Payer: Medicare Other | Source: Home / Self Care

## 2022-11-22 ENCOUNTER — Ambulatory Visit (HOSPITAL_COMMUNITY): Admission: RE | Admit: 2022-11-22 | Payer: Medicare Other | Source: Home / Self Care | Admitting: Cardiology

## 2022-11-22 SURGERY — ATRIAL FIBRILLATION ABLATION
Anesthesia: General

## 2022-11-27 ENCOUNTER — Telehealth: Payer: Self-pay | Admitting: Cardiology

## 2022-11-27 MED ORDER — DRONEDARONE HCL 400 MG PO TABS: 400.0000 mg | ORAL_TABLET | Freq: Two times a day (BID) | ORAL | 0 refills | Status: DC

## 2022-11-27 NOTE — Telephone Encounter (Signed)
Requested Prescriptions   Signed Prescriptions Disp Refills   dronedarone (MULTAQ) 400 MG tablet 180 tablet 0    Sig: Take 1 tablet (400 mg total) by mouth 2 (two) times daily with a meal.    Authorizing Provider: Sherie Don    Ordering User: Kendrick Fries

## 2022-11-27 NOTE — Telephone Encounter (Signed)
*  STAT* If patient is at the pharmacy, call can be transferred to refill team.   1. Which medications need to be refilled? (please list name of each medication and dose if known)   dronedarone (MULTAQ) 400 MG tablet      4. Which pharmacy/location (including street and city if local pharmacy) is medication to be sent to? [id number] - [place], [place] - [address] [facility] [place] [address] [facility]     5. Do they need a 30 day or 90 day supply? 90

## 2022-12-03 ENCOUNTER — Telehealth: Payer: Self-pay | Admitting: Cardiology

## 2022-12-03 DIAGNOSIS — I4819 Other persistent atrial fibrillation: Secondary | ICD-10-CM

## 2022-12-03 NOTE — Telephone Encounter (Signed)
Pt trying to schedule their ablation. Please advise

## 2022-12-04 NOTE — Telephone Encounter (Signed)
Pt following up trying to schedule their ablation. Please advise

## 2022-12-04 NOTE — Telephone Encounter (Signed)
Pt has been scheduled on 10/11 at 10:30 AM with Dr. Lalla Brothers for Afib Ablation.  She will go to [facility] to have labs done.

## 2022-12-05 ENCOUNTER — Telehealth: Payer: Self-pay

## 2022-12-05 NOTE — Telephone Encounter (Signed)
Spoke with patient, made aware of CT results and to follow up with her PCP concerning the enlarged lymph nodes. Ablation cancelled at this time. Patient expresses understanding. No questions at this time

## 2022-12-05 NOTE — Telephone Encounter (Signed)
-----   Message from Rossie [facility] sent at 12/02/2022 10:16 PM EDT ----- CT shows enlarged lymph nodes in the chest.   Carly, she needs to see her primary care physician to have this evaluated prior to rescheduling an ablation.   Sheria Lang T. Lalla Brothers, MD, [facility], [facility] Cardiac Electrophysiology

## 2022-12-07 ENCOUNTER — Other Ambulatory Visit: Payer: Self-pay | Admitting: Cardiology

## 2022-12-09 ENCOUNTER — Other Ambulatory Visit: Payer: Self-pay | Admitting: *Deleted

## 2022-12-09 NOTE — Telephone Encounter (Signed)
Prescription refill request for Eliquis received. Indication:afib Last office visit:7/24 Scr:0.92  7/24 Age: 42 Weight:101.2  kg  Prescription refilled

## 2022-12-20 ENCOUNTER — Ambulatory Visit (HOSPITAL_COMMUNITY): Payer: Medicare Other | Admitting: Internal Medicine

## 2022-12-27 ENCOUNTER — Encounter (HOSPITAL_COMMUNITY): Payer: Self-pay

## 2022-12-27 ENCOUNTER — Ambulatory Visit (HOSPITAL_COMMUNITY): Admit: 2022-12-27 | Payer: Medicare Other | Admitting: Cardiology

## 2022-12-27 SURGERY — ATRIAL FIBRILLATION ABLATION
Anesthesia: General

## 2022-12-31 NOTE — Progress Notes (Deleted)
Cardiology Office Note Date:  12/31/2022  Patient ID:  Katie Hampton, DOB 1981/03/18, MRN [id number] PCP:  Center, [facility]  Cardiologist:  None Electrophysiologist: Lanier Prude, MD    Chief Complaint: afib  History of Present Illness: Katie Hampton is a 42 y.o. female with PMH notable for Aflutter, Atach, AFib, T1DM; seen today for Lanier Prude, MD for routine EP follow-up.  She was first diagnosed with A-fib 05/2022 while hospitalized due to sepsis from a right renal abscess.  Was started on flecainide and metoprolol at that time.  No anticoagulation due to low CHA2DS2-VASc and active infection. During ETT, she had significant increase in her QRS duration and so flecainide was stopped.  She started multaq in an effort to avoid her exposure to amiodarone at young age. Planning for AFib ablation.  She had routine pre-AF ablation CT that showed adenopathy, so procedure has been postponed until further workup.   On follow-up today,  *** AF burden, symptoms *** palpitations *** bleeding concerns -- discuss chadsvasc = 2  *** seen PCP   She denies chest pain, palpitations, dyspnea, PND, orthopnea, nausea, vomiting, dizziness, syncope, edema, weight gain, or early satiety.      AAD History: Flecainide - stopped d/t prolonged QRS Multaq   Past Medical History:  Diagnosis Date   Diabetes mellitus    Type 1   Pyelonephritis     Past Surgical History:  Procedure Laterality Date   CARDIOVERSION N/A 09/30/2022   Procedure: CARDIOVERSION;  Surgeon: Iran Ouch, MD;  Location: [facility] ORS;  Service: Cardiovascular;  Laterality: N/A;   CARDIOVERSION N/A 10/07/2022   Procedure: CARDIOVERSION;  Surgeon: Regan Lemming, MD;  Location: [facility];  Service: Cardiovascular;  Laterality: N/A;   CHOLECYSTECTOMY     EYE SURGERY Bilateral    TUBAL LIGATION Bilateral     Current Outpatient Medications  Medication Instructions   amitriptyline  (ELAVIL) 50 mg, Oral, Daily at bedtime   atorvastatin (LIPITOR) 20 mg, Oral, 3 times weekly   BD INSULIN SYRINGE U/F 31G X 5/16" 1 ML MISC USE 1 (ONE) SYRINGE 3-4 TIMES DAILY   doxycycline (VIBRA-TABS) 100 mg, Oral, 2 times daily   dronedarone (MULTAQ) 400 mg, Oral, 2 times daily with meals   Eliquis 5 mg, Oral, 2 times daily   fenofibrate micronized (LOFIBRA) 200 mg, Oral, Daily   gabapentin (NEURONTIN) 600 mg, Oral, Daily PRN   ibuprofen (ADVIL) 800 mg, Oral, 3 times daily PRN   insulin NPH Human (NOVOLIN N) 18-22 Units, Subcutaneous, 2 times daily before meals, Per patients sliding scale: breakfast 20-22 units,  supper 15-20 units   insulin regular (NOVOLIN R) 18-24 Units, Subcutaneous, 2 times daily before meals, Per patients sliding scale: breakfast 20-24 units, supper 10-14   LUTEIN PO 1 capsule, Oral, Daily   metoprolol tartrate (LOPRESSOR) 50 mg, Oral, 2 times daily   Probiotic Product (PROBIOTIC DAILY PO) 1 capsule, Oral, 2 times weekly   SUMAtriptan (IMITREX) 100 mg, Oral, Every 2 hours PRN   Vitamin D (Ergocalciferol) (DRISDOL) 50,000 Units, Oral, Weekly    Social History:  The patient  reports that she has quit smoking. Her smoking use included cigarettes. She has never used smokeless tobacco. She reports current alcohol use. She reports that she does not use drugs.   Family History:  The patient's family history includes Colon cancer in her maternal aunt; Kidney disease in her sister.  ROS:  Please see the history of present illness. All other  systems are reviewed and otherwise negative.   PHYSICAL EXAM:  VS:  There were no vitals taken for this visit. BMI: There is no height or weight on file to calculate BMI.  GEN- The patient is well appearing, alert and oriented x 3 today.   Lungs- Clear to ausculation bilaterally, normal work of breathing.  Heart- Regular rate and rhythm, no murmurs, rubs or gallops Extremities- No peripheral edema, warm, dry  EKG is ordered.  Personal review of EKG from today shows:      Recent Labs: 05/24/2022: TSH 1.001 06/13/2022: ALT 16 10/12/2022: BUN 8; Creatinine, Ser 0.92; Hemoglobin 15.9; Magnesium 1.9; Platelets 413; Potassium 3.6; Sodium 134  No results found for requested labs within last 365 days.   CrCl cannot be calculated (Patient's most recent lab result is older than the maximum 21 days allowed.).   Wt Readings from Last 3 Encounters:  11/08/22 223 lb 1.7 oz (101.2 kg)  10/25/22 223 lb 3.2 oz (101.2 kg)  10/12/22 220 lb 7.4 oz (100 kg)     Additional studies reviewed include: Previous EP, cardiology notes.   CT cardiac morph w pulm vein, 11/19/2022 1. There is normal pulmonary vein drainage into the left atrium.  2. The left atrial appendage is a Windsock type with ostial size 20 x 13 mm and length 30 mm. There is no thrombus in the left atrial appendage.  3. The esophagus runs in the left atrial midline and is not in the proximity to any of the pulmonary veins. Over-read CT Chest, 11/19/2022 Bilateral hilar adenopathy. Consider contrast-enhanced CT of the chest to assess for additional pathology, if indicated.  ETT, 07/26/2022 Target heart rate achieved Increase in QRS at peak exercise with normalization in recovery  Long term monitor, 05/31/2022   Normal sinus rhythm predominantly ranging between 43 and 124 bpm with average HR 68 bpm   <1% Atrial flutter burden ranging from 52-124 bpm (avg 96 bpm), longest lasting 4 minute 31 seconds   No evidence of AFib or ventricular arrythmias   No evidence of AV blocks or pauses   4.5% PAC burden and 0% PVC burden   Patient triggered events correlate with AFlutter, NSR and PAC  TTE, 05/26/2022  1. Left ventricular ejection fraction, by estimation, is 60 to 65%. The left ventricle has normal function. The left ventricle has no regional wall motion abnormalities. Left ventricular diastolic parameters are indeterminate.   2. Right ventricular systolic function is  normal. The right ventricular size is normal. There is normal pulmonary artery systolic pressure. The estimated right ventricular systolic pressure is 26.0 mmHg.   3. The mitral valve is normal in structure. No evidence of mitral valve regurgitation. No evidence of mitral stenosis.   4. The aortic valve has an indeterminant number of cusps. Aortic valve regurgitation is not visualized. No aortic stenosis is present.   5. The inferior vena cava is normal in size with greater than 50% respiratory variability, suggesting right atrial pressure of 3 mmHg.   6. There is a very small patent foramen ovale with minimal left to right shunting across the atrial septum.    ASSESSMENT AND PLAN:  #) parox AFib #) typical aflutter AF ablation scheduled w Dr.Lambert S/p DCCV x 2 with multaq  Has converted to NSR today, unclear when she converted Restart multaq 400mg  BID   #) Hypercoag d/t atrial flutter CHA2DS2-VASc Score = 2 [CHF History: 0, HTN History: 0, Diabetes History: 1, Stroke History: 0, Vascular Disease History: 0, Age  Score: 0, Gender Score: 1].  Therefore, the patient's annual risk of stroke is 2.2 %. Stroke ppx - 5mg  eliquis BID, appropriately dosed No bleeding concerns Will continue eliquis at this time uninterupted given recent CV and upcoming AF ablation   Current medicines are reviewed at length with the patient today.   The patient does not have concerns regarding her medicines.  The following changes were made today:   Restart 400mg  multaq BID   Labs/ tests ordered today include:  No orders of the defined types were placed in this encounter.    Disposition: Follow up with {EPMDS:28135} or EP APP {EPFOLLOW UP:28173}   Signed, Sherie Don, NP  12/31/22  7:58 PM  Electrophysiology [facility]

## 2023-01-01 ENCOUNTER — Ambulatory Visit: Payer: Medicare Other | Admitting: Cardiology

## 2023-01-01 DIAGNOSIS — I480 Paroxysmal atrial fibrillation: Secondary | ICD-10-CM

## 2023-01-01 DIAGNOSIS — D6869 Other thrombophilia: Secondary | ICD-10-CM

## 2023-01-01 DIAGNOSIS — I483 Typical atrial flutter: Secondary | ICD-10-CM

## 2023-01-01 NOTE — Progress Notes (Signed)
Cardiology Office Note Date:  01/06/2023  Patient ID:  Katie Hampton, DOB Aug 06, 1980, MRN [id number] PCP:  [facility], [facility]  Cardiologist:  None Electrophysiologist: Lanier Prude, MD    Chief Complaint: afib  History of Present Illness: Katie Hampton is a 42 y.o. female with PMH notable for Aflutter, Atach, AFib, T1DM; seen today for Lanier Prude, MD for routine EP follow-up.  She was first diagnosed with A-fib 05/2022 while hospitalized due to sepsis from a right renal abscess.  Was started on flecainide and metoprolol at that time.  No anticoagulation due to low CHA2DS2-VASc and active infection. During ETT, she had significant increase in her QRS duration and so flecainide was stopped.  She started multaq in an effort to avoid her exposure to amiodarone at young age. Planning for AFib ablation.  She had routine pre-AF ablation CT that showed adenopathy, so procedure has been postponed until further workup.   On follow-up today, she has ongoing fatigue and lack of appetite. Continues to have episodes of afib, but thinks they are better controlled now. Main symptom during afib episodes is palpitations and feeling her heart racing. She uses apple watch to monitor ventricular rates control, pulses are typically 60-80s.  She continues to take eliquis BID, no missed doses. Has some blood on toilet paper when constipated and straining. She has also noticed her periods are heavier and longer than prior to starting eliquis.  She has CT planned for tomorrow to further eval adenopathy.   She denies chest pain, dyspnea, PND, orthopnea, nausea, vomiting, dizziness, syncope, edema, weight gain.     AAD History: Flecainide - stopped d/t prolonged QRS Multaq   Past Medical History:  Diagnosis Date   Diabetes mellitus    Type 1   Pyelonephritis     Past Surgical History:  Procedure Laterality Date   CARDIOVERSION N/A 09/30/2022   Procedure: CARDIOVERSION;  Surgeon:  Iran Ouch, MD;  Location: [facility] ORS;  Service: Cardiovascular;  Laterality: N/A;   CARDIOVERSION N/A 10/07/2022   Procedure: CARDIOVERSION;  Surgeon: Regan Lemming, MD;  Location: [facility] INVASIVE CV LAB;  Service: Cardiovascular;  Laterality: N/A;   CHOLECYSTECTOMY     EYE SURGERY Bilateral    TUBAL LIGATION Bilateral     Current Outpatient Medications  Medication Instructions   amitriptyline (ELAVIL) 50 mg, Oral, Daily at bedtime   apixaban (ELIQUIS) 5 mg, Oral, 2 times daily   atorvastatin (LIPITOR) 20 mg, Oral, 3 times weekly   BD INSULIN SYRINGE U/F 31G X 5/16" 1 ML MISC USE 1 (ONE) SYRINGE 3-4 TIMES DAILY   dronedarone (MULTAQ) 400 mg, Oral, 2 times daily with meals   fenofibrate micronized (LOFIBRA) 200 mg, Oral, Daily   gabapentin (NEURONTIN) 600 mg, Oral, Daily PRN   ibuprofen (ADVIL) 800 mg, Oral, 3 times daily PRN   insulin NPH Human (NOVOLIN N) 18-22 Units, Subcutaneous, 2 times daily before meals, Per patients sliding scale: breakfast 20-22 units,  supper 15-20 units   insulin regular (NOVOLIN R) 18-24 Units, Subcutaneous, 2 times daily before meals, Per patients sliding scale: breakfast 20-24 units, supper 10-14   LUTEIN PO 1 capsule, Oral, Daily   metoprolol tartrate (LOPRESSOR) 50 mg, Oral, 2 times daily   Probiotic Product (PROBIOTIC DAILY PO) 1 capsule, Oral, 2 times weekly   SUMAtriptan (IMITREX) 100 mg, Oral, Every 2 hours PRN   Vitamin D (Ergocalciferol) (DRISDOL) 50,000 Units, Oral, Weekly    Social History:  The patient  reports that she has  quit smoking. Her smoking use included cigarettes. She has never used smokeless tobacco. She reports current alcohol use. She reports that she does not use drugs.   Family History:  The patient's family history includes Colon cancer in her maternal aunt; Kidney disease in her sister.  ROS:  Please see the history of present illness. All other systems are reviewed and otherwise negative.   PHYSICAL EXAM:  VS:  BP  118/70 (BP Location: Left Arm, Patient Position: Sitting, Cuff Size: Large)   Pulse 69   Ht 5\' 3"  (1.6 m)   Wt 224 lb 4 oz (101.7 kg)   SpO2 98%   BMI 39.72 kg/m  BMI: Body mass index is 39.72 kg/m.  GEN- The patient is well appearing, alert and oriented x 3 today.   Lungs- Clear to ausculation bilaterally, normal work of breathing.  Heart- Irregularly irregular rate and rhythm, no murmurs, rubs or gallops Extremities- No peripheral edema, warm, dry  EKG is ordered. Personal review of EKG from today shows:    Coarse AFib, rate 69bpm  Recent Labs: 05/24/2022: TSH 1.001 06/13/2022: ALT 16 10/12/2022: BUN 8; Creatinine, Ser 0.92; Hemoglobin 15.9; Magnesium 1.9; Platelets 413; Potassium 3.6; Sodium 134  No results found for requested labs within last 365 days.   CrCl cannot be calculated (Patient's most recent lab result is older than the maximum 21 days allowed.).   Wt Readings from Last 3 Encounters:  01/06/23 224 lb 4 oz (101.7 kg)  11/08/22 223 lb 1.7 oz (101.2 kg)  10/25/22 223 lb 3.2 oz (101.2 kg)     Additional studies reviewed include: Previous EP, cardiology notes.   CT cardiac morph w pulm vein, 11/19/2022 1. There is normal pulmonary vein drainage into the left atrium.  2. The left atrial appendage is a Windsock type with ostial size 20 x 13 mm and length 30 mm. There is no thrombus in the left atrial appendage.  3. The esophagus runs in the left atrial midline and is not in the proximity to any of the pulmonary veins. Over-read CT Chest, 11/19/2022 Bilateral hilar adenopathy. Consider contrast-enhanced CT of the chest to assess for additional pathology, if indicated.  ETT, 07/26/2022 Target heart rate achieved Increase in QRS at peak exercise with normalization in recovery  Long term monitor, 05/31/2022   Normal sinus rhythm predominantly ranging between 43 and 124 bpm with average HR 68 bpm   <1% Atrial flutter burden ranging from 52-124 bpm (avg 96 bpm), longest  lasting 4 minute 31 seconds   No evidence of AFib or ventricular arrythmias   No evidence of AV blocks or pauses   4.5% PAC burden and 0% PVC burden   Patient triggered events correlate with AFlutter, NSR and PAC  TTE, 05/26/2022  1. Left ventricular ejection fraction, by estimation, is 60 to 65%. The left ventricle has normal function. The left ventricle has no regional wall motion abnormalities. Left ventricular diastolic parameters are indeterminate.   2. Right ventricular systolic function is normal. The right ventricular size is normal. There is normal pulmonary artery systolic pressure. The estimated right ventricular systolic pressure is 26.0 mmHg.   3. The mitral valve is normal in structure. No evidence of mitral valve regurgitation. No evidence of mitral stenosis.   4. The aortic valve has an indeterminant number of cusps. Aortic valve regurgitation is not visualized. No aortic stenosis is present.   5. The inferior vena cava is normal in size with greater than 50% respiratory variability, suggesting right  atrial pressure of 3 mmHg.   6. There is a very small patent foramen ovale with minimal left to right shunting across the atrial septum.    ASSESSMENT AND PLAN:  #) fatigue #) bilateral hilar adenopathy #) parox AFib #) typical flutter #) hypercoag d/t afib Unclear cause of patient's fatigue, could be related to recently identified adenopathy or increased afib burden, or other condition Ventricular rates well-controlled She has further workup planned for adenopathy, defer to PCP for mgmt CHA2DS2-VASc Score = 2 (DM, gender)  Will continue multaq 400mg  BID and eliquis 5mg  BID at this time Continue lopressor 50mg  BID Update BMP and CBC Close follow-up planned to continue to monitor afib burden    Current medicines are reviewed at length with the patient today.   The patient does not have concerns regarding her medicines.  The following changes were made today:    none   Labs/ tests ordered today include:  Orders Placed This Encounter  Procedures   Basic metabolic panel   CBC   EKG 12-Lead     Disposition: Follow up with Dr. Lalla Brothers or EP APP in 3 months   Signed, Sherie Don, NP  01/06/23  12:09 PM  Electrophysiology [facility]

## 2023-01-06 ENCOUNTER — Ambulatory Visit: Payer: Medicare Other | Attending: Cardiology | Admitting: Cardiology

## 2023-01-06 ENCOUNTER — Encounter: Payer: Self-pay | Admitting: Cardiology

## 2023-01-06 VITALS — BP 118/70 | HR 69 | Ht 63.0 in | Wt 224.2 lb

## 2023-01-06 DIAGNOSIS — I483 Typical atrial flutter: Secondary | ICD-10-CM | POA: Diagnosis not present

## 2023-01-06 DIAGNOSIS — R5383 Other fatigue: Secondary | ICD-10-CM | POA: Diagnosis not present

## 2023-01-06 DIAGNOSIS — I4819 Other persistent atrial fibrillation: Secondary | ICD-10-CM | POA: Insufficient documentation

## 2023-01-06 DIAGNOSIS — D6869 Other thrombophilia: Secondary | ICD-10-CM | POA: Insufficient documentation

## 2023-01-06 DIAGNOSIS — R590 Localized enlarged lymph nodes: Secondary | ICD-10-CM | POA: Diagnosis present

## 2023-01-06 MED ORDER — APIXABAN 5 MG PO TABS: 5.0000 mg | ORAL_TABLET | Freq: Two times a day (BID) | ORAL | 3 refills | Status: DC

## 2023-01-06 NOTE — Patient Instructions (Signed)
Medication Instructions:  Your Physician recommend you continue on your current medication as directed.    *If you need a refill on your cardiac medications before your next appointment, please call your pharmacy*   Lab Work: Your provider would like for you to have following labs drawn today CBC and BMET.   If you have labs (blood work) drawn today and your tests are completely normal, you will receive your results only by: MyChart Message (if you have MyChart) OR A paper copy in the mail If you have any lab test that is abnormal or we need to change your treatment, we will call you to review the results.   Follow-Up: At [facility], you and your health needs are our priority.  As part of our continuing mission to provide you with exceptional heart care, we have created designated Provider Care Teams.  These Care Teams include your primary Cardiologist (physician) and Advanced Practice Providers (APPs -  Physician Assistants and Nurse Practitioners) who all work together to provide you with the care you need, when you need it.  We recommend signing up for the patient portal called "MyChart".  Sign up information is provided on this After Visit Summary.  MyChart is used to connect with patients for Virtual Visits (Telemedicine).  Patients are able to view lab/test results, encounter notes, upcoming appointments, etc.  Non-urgent messages can be sent to your provider as well.   To learn more about what you can do with MyChart, go to [email or web address].    Your next appointment:   3 month(s)  Provider:   You may see Sherie Don, NP

## 2023-01-07 LAB — BASIC METABOLIC PANEL
BUN/Creatinine Ratio: 6 — ABNORMAL LOW (ref 9–23)
BUN: 5 mg/dL — ABNORMAL LOW (ref 6–24)
CO2: 18 mmol/L — ABNORMAL LOW (ref 20–29)
Calcium: 8.1 mg/dL — ABNORMAL LOW (ref 8.7–10.2)
Chloride: 103 mmol/L (ref 96–106)
Creatinine, Ser: 0.8 mg/dL (ref 0.57–1.00)
Glucose: 287 mg/dL — ABNORMAL HIGH (ref 70–99)
Potassium: 3.8 mmol/L (ref 3.5–5.2)
Sodium: 141 mmol/L (ref 134–144)
eGFR: 95 mL/min/{1.73_m2} (ref >59)

## 2023-01-07 LAB — CBC
Hematocrit: 40.2 % (ref 34.0–46.6)
Hemoglobin: 12.9 g/dL (ref 11.1–15.9)
MCH: 33.2 pg — ABNORMAL HIGH (ref 26.6–33.0)
MCHC: 32.1 g/dL (ref 31.5–35.7)
MCV: 103 fL — ABNORMAL HIGH (ref 79–97)
Platelets: 234 10*3/uL (ref 150–450)
RBC: 3.89 x10E6/uL (ref 3.77–5.28)
RDW: 12.7 % (ref 11.7–15.4)
WBC: 7.7 10*3/uL (ref 3.4–10.8)

## 2023-01-21 DIAGNOSIS — I4819 Other persistent atrial fibrillation: Secondary | ICD-10-CM

## 2023-01-27 ENCOUNTER — Ambulatory Visit: Payer: Medicare Other | Admitting: Physician Assistant

## 2023-02-04 DIAGNOSIS — I4819 Other persistent atrial fibrillation: Secondary | ICD-10-CM

## 2023-02-10 ENCOUNTER — Ambulatory Visit (HOSPITAL_COMMUNITY): Payer: Medicare Other | Admitting: Anesthesiology

## 2023-02-10 NOTE — Pre-Procedure Instructions (Signed)
Instructed patient on the following items: Arrival time 0730 Nothing to eat or drink after midnight No meds AM of procedure Responsible person to drive you home and stay with you for 24 hrs  Have you missed any doses of anti-coagulant Eliquis- takes twice a day, hasn't missed any doses.  Don't take dose in the morning.

## 2023-02-10 NOTE — Anesthesia Preprocedure Evaluation (Signed)
Anesthesia Evaluation    Reviewed: Allergy & Precautions, Patient's Chart, lab work & pertinent test results, Unable to perform ROS - Chart review only  History of Anesthesia Complications Negative for: history of anesthetic complications  Airway        Dental   Pulmonary former smoker          Cardiovascular + dysrhythmias Atrial Fibrillation   TEE 09/2022  1. Left ventricular ejection fraction, by estimation, is 60 to 65%. The left ventricle has normal function. The left ventricle has no regional wall motion abnormalities.   2. Right ventricular systolic function is normal. The right ventricular size is normal.   3. No left atrial/left atrial appendage thrombus was detected.   4. The mitral valve is normal in structure. No evidence of mitral valve regurgitation.   5. The aortic valve is tricuspid. Aortic valve regurgitation is not visualized.    TTE 05/2022  1. Left ventricular ejection fraction, by estimation, is 60 to 65%. The left ventricle has normal function. The left ventricle has no regional wall motion abnormalities. Left ventricular diastolic parameters are indeterminate.   2. Right ventricular systolic function is normal. The right ventricular size is normal. There is normal pulmonary artery systolic pressure. The estimated right ventricular systolic pressure is 26.0 mmHg.   3. The mitral valve is normal in structure. No evidence of mitral valve regurgitation. No evidence of mitral stenosis.   4. The aortic valve has an indeterminant number of cusps. Aortic valve regurgitation is not visualized. No aortic stenosis is present.   5. The inferior vena cava is normal in size with greater than 50% respiratory variability, suggesting right atrial pressure of 3 mmHg.   6. There is a very small patent foramen ovale with minimal left to right shunting across the atrial septum.     Neuro/Psych neg Seizures  Neuromuscular disease   negative psych ROS   GI/Hepatic negative GI ROS, Neg liver ROS,,,  Endo/Other  diabetes, Insulin Dependent  Lab Results      Component                Value               Date                      HGBA1C                   7.4 (H)             05/25/2022             Renal/GU Lab Results      Component                Value               Date                      CREATININE               0.78                09/27/2022                Musculoskeletal negative musculoskeletal ROS (+)    Abdominal   Peds  Hematology  (+) Blood dyscrasia Lab Results      Component                Value  Date                      WBC                      8.6                 09/27/2022                HGB                      14.9                09/27/2022                HCT                      42.0                09/27/2022                MCV                      92.3                09/27/2022                PLT                      345                 09/27/2022            eliquis   Anesthesia Other Findings   Reproductive/Obstetrics Lab Results      Component                Value               Date                      PREGTESTUR               NEGATIVE            05/24/2022                HCG                      <5.0                07/30/2015                                        Anesthesia Physical Anesthesia Plan  ASA: 3  Anesthesia Plan: General   Post-op Pain Management: Minimal or no pain anticipated and Tylenol PO (pre-op)*   Induction: Intravenous  PONV Risk Score and Plan: 3 and Treatment may vary due to age or medical condition, Ondansetron, Dexamethasone and Midazolam  Airway Management Planned: Oral ETT  Additional Equipment: None  Intra-op Plan:   Post-operative Plan: Extubation in OR  Informed Consent:   Plan Discussed with:   Anesthesia Plan Comments: (Pt was told not to take any medicine by cath lab preop staff. Blood glc >  350 not safe for elective surgery.)       Anesthesia Quick Evaluation

## 2023-02-11 ENCOUNTER — Other Ambulatory Visit: Payer: Self-pay

## 2023-02-11 ENCOUNTER — Ambulatory Visit (HOSPITAL_COMMUNITY)
Admission: RE | Admit: 2023-02-11 | Discharge: 2023-02-11 | Disposition: A | Discharge status: Home / Self Care | Payer: Medicare Other | Attending: Cardiology | Admitting: Cardiology

## 2023-02-11 ENCOUNTER — Ambulatory Visit (HOSPITAL_COMMUNITY): Payer: Self-pay | Admitting: Anesthesiology

## 2023-02-11 ENCOUNTER — Encounter (HOSPITAL_COMMUNITY): Payer: Self-pay | Admitting: Cardiology

## 2023-02-11 ENCOUNTER — Encounter (HOSPITAL_COMMUNITY)
Admission: RE | Disposition: A | Discharge status: Home / Self Care | Payer: Self-pay | Source: Home / Self Care | Attending: Cardiology

## 2023-02-11 DIAGNOSIS — Z539 Procedure and treatment not carried out, unspecified reason: Secondary | ICD-10-CM | POA: Diagnosis present

## 2023-02-11 LAB — BASIC METABOLIC PANEL
BUN/Creatinine Ratio: 6 — ABNORMAL LOW (ref 9–23)
BUN: 5 mg/dL — ABNORMAL LOW (ref 6–24)
CO2: 21 mmol/L (ref 20–29)
Calcium: 8.8 mg/dL (ref 8.7–10.2)
Chloride: 106 mmol/L (ref 96–106)
Creatinine, Ser: 0.87 mg/dL (ref 0.57–1.00)
Glucose: 129 mg/dL — ABNORMAL HIGH (ref 70–99)
Potassium: 4.1 mmol/L (ref 3.5–5.2)
Sodium: 146 mmol/L — ABNORMAL HIGH (ref 134–144)
eGFR: 86 mL/min/{1.73_m2} (ref >59)

## 2023-02-11 LAB — CBC
Hematocrit: 34.4 % (ref 34.0–46.6)
Hemoglobin: 10.9 g/dL — ABNORMAL LOW (ref 11.1–15.9)
MCH: 31 pg (ref 26.6–33.0)
MCHC: 31.7 g/dL (ref 31.5–35.7)
MCV: 98 fL — ABNORMAL HIGH (ref 79–97)
Platelets: 318 10*3/uL (ref 150–450)
RBC: 3.52 x10E6/uL — ABNORMAL LOW (ref 3.77–5.28)
RDW: 14.8 % (ref 11.7–15.4)
WBC: 7.2 10*3/uL (ref 3.4–10.8)

## 2023-02-11 LAB — GLUCOSE, CAPILLARY: Glucose-Capillary: 373 mg/dL — ABNORMAL HIGH (ref 70–99)

## 2023-02-11 LAB — PREGNANCY, URINE: Preg Test, Ur: NEGATIVE

## 2023-02-11 SURGERY — ATRIAL FIBRILLATION ABLATION
Anesthesia: General

## 2023-02-11 MED ORDER — SODIUM CHLORIDE 0.9 % IV SOLN: INTRAVENOUS | Status: DC

## 2023-02-11 MED ORDER — ACETAMINOPHEN 500 MG PO TABS
1000.0000 mg | ORAL_TABLET | Freq: Once | ORAL | Status: AC
  Administered 2023-02-11: 1000 mg via ORAL
  Filled 2023-02-11: qty 2

## 2023-02-20 MED ORDER — DRONEDARONE HCL 400 MG PO TABS: 400.0000 mg | ORAL_TABLET | Freq: Two times a day (BID) | ORAL | 0 refills | Status: DC

## 2023-02-20 NOTE — Addendum Note (Signed)
Addended by: Jani Gravel on: 02/20/2023 08:22 AM   Modules accepted: Orders

## 2023-02-21 ENCOUNTER — Ambulatory Visit: Payer: Medicare Other | Admitting: Cardiology

## 2023-02-27 ENCOUNTER — Telehealth: Payer: Self-pay

## 2023-02-27 NOTE — Telephone Encounter (Signed)
LM for pt to call back to go over Ablation details for her 12/13 procedure.

## 2023-02-27 NOTE — Telephone Encounter (Signed)
Pt returned my call. She is aware of the time to be there tomorrow. I made sure she knows and understands how to manage her Insulin and she does understand. She will call back with any questions or concerns.  She has my direct number.

## 2023-02-28 ENCOUNTER — Other Ambulatory Visit: Payer: Self-pay

## 2023-02-28 ENCOUNTER — Ambulatory Visit (HOSPITAL_COMMUNITY)
Admission: RE | Admit: 2023-02-28 | Discharge: 2023-02-28 | Disposition: A | Discharge status: Home / Self Care | Payer: Medicare Other | Source: Home / Self Care | Attending: Cardiology | Admitting: Cardiology

## 2023-02-28 ENCOUNTER — Encounter (HOSPITAL_COMMUNITY): Payer: Self-pay | Admitting: Anesthesiology

## 2023-02-28 ENCOUNTER — Encounter (HOSPITAL_COMMUNITY)
Admission: RE | Disposition: A | Discharge status: Home / Self Care | Payer: Self-pay | Source: Home / Self Care | Attending: Cardiology

## 2023-02-28 DIAGNOSIS — I4891 Unspecified atrial fibrillation: Secondary | ICD-10-CM | POA: Insufficient documentation

## 2023-02-28 DIAGNOSIS — Z539 Procedure and treatment not carried out, unspecified reason: Secondary | ICD-10-CM | POA: Insufficient documentation

## 2023-02-28 LAB — GLUCOSE, CAPILLARY
Glucose-Capillary: 127 mg/dL — ABNORMAL HIGH (ref 70–99)
Glucose-Capillary: 93 mg/dL (ref 70–99)

## 2023-02-28 LAB — PREGNANCY, URINE: Preg Test, Ur: NEGATIVE

## 2023-02-28 SURGERY — ATRIAL FIBRILLATION ABLATION
Anesthesia: General

## 2023-02-28 MED ORDER — SODIUM CHLORIDE 0.9 % IV SOLN: INTRAVENOUS | Status: DC

## 2023-02-28 NOTE — Anesthesia Preprocedure Evaluation (Signed)
Anesthesia Evaluation    Reviewed: Allergy & Precautions, Patient's Chart, lab work & pertinent test results  Airway        Dental   Pulmonary former smoker          Cardiovascular + dysrhythmias Atrial Fibrillation      Neuro/Psych negative neurological ROS     GI/Hepatic negative GI ROS, Neg liver ROS,,,  Endo/Other  diabetes, Insulin Dependent    Renal/GU negative Renal ROS     Musculoskeletal negative musculoskeletal ROS (+)    Abdominal   Peds  Hematology  (+) Blood dyscrasia (Eliquis)   Anesthesia Other Findings A-fib  Reproductive/Obstetrics Hcg negative                             Anesthesia Physical Anesthesia Plan  ASA:   Anesthesia Plan: General   Post-op Pain Management:    Induction:   PONV Risk Score and Plan:   Airway Management Planned:   Additional Equipment:   Intra-op Plan:   Post-operative Plan:   Informed Consent:   Plan Discussed with:   Anesthesia Plan Comments:        Anesthesia Quick Evaluation

## 2023-03-01 ENCOUNTER — Other Ambulatory Visit: Payer: Self-pay

## 2023-03-01 ENCOUNTER — Inpatient Hospital Stay (HOSPITAL_COMMUNITY)
Admission: EM | Admit: 2023-03-01 | Discharge: 2023-03-03 | DRG: 812 | Disposition: A | Discharge status: Home / Self Care | Payer: Medicare Other | Attending: Internal Medicine | Admitting: Internal Medicine

## 2023-03-01 ENCOUNTER — Encounter (HOSPITAL_COMMUNITY): Payer: Self-pay | Admitting: *Deleted

## 2023-03-01 DIAGNOSIS — Z794 Long term (current) use of insulin: Secondary | ICD-10-CM

## 2023-03-01 DIAGNOSIS — E876 Hypokalemia: Secondary | ICD-10-CM

## 2023-03-01 DIAGNOSIS — D649 Anemia, unspecified: Principal | ICD-10-CM | POA: Diagnosis present

## 2023-03-01 DIAGNOSIS — I480 Paroxysmal atrial fibrillation: Secondary | ICD-10-CM | POA: Diagnosis present

## 2023-03-01 DIAGNOSIS — E1042 Type 1 diabetes mellitus with diabetic polyneuropathy: Secondary | ICD-10-CM | POA: Diagnosis present

## 2023-03-01 DIAGNOSIS — R9431 Abnormal electrocardiogram [ECG] [EKG]: Secondary | ICD-10-CM

## 2023-03-01 DIAGNOSIS — D509 Iron deficiency anemia, unspecified: Principal | ICD-10-CM | POA: Diagnosis present

## 2023-03-01 DIAGNOSIS — E109 Type 1 diabetes mellitus without complications: Secondary | ICD-10-CM | POA: Diagnosis present

## 2023-03-01 DIAGNOSIS — I4819 Other persistent atrial fibrillation: Secondary | ICD-10-CM | POA: Diagnosis present

## 2023-03-01 DIAGNOSIS — E66812 Obesity, class 2: Secondary | ICD-10-CM | POA: Diagnosis present

## 2023-03-01 DIAGNOSIS — R55 Syncope and collapse: Secondary | ICD-10-CM | POA: Diagnosis present

## 2023-03-01 DIAGNOSIS — E785 Hyperlipidemia, unspecified: Secondary | ICD-10-CM | POA: Diagnosis present

## 2023-03-01 DIAGNOSIS — N921 Excessive and frequent menstruation with irregular cycle: Secondary | ICD-10-CM

## 2023-03-01 DIAGNOSIS — Z7901 Long term (current) use of anticoagulants: Secondary | ICD-10-CM

## 2023-03-01 DIAGNOSIS — Z888 Allergy status to other drugs, medicaments and biological substances status: Secondary | ICD-10-CM

## 2023-03-01 DIAGNOSIS — Z87891 Personal history of nicotine dependence: Secondary | ICD-10-CM

## 2023-03-01 DIAGNOSIS — Z79899 Other long term (current) drug therapy: Secondary | ICD-10-CM

## 2023-03-01 DIAGNOSIS — E538 Deficiency of other specified B group vitamins: Secondary | ICD-10-CM

## 2023-03-01 DIAGNOSIS — Z6839 Body mass index (BMI) 39.0-39.9, adult: Secondary | ICD-10-CM

## 2023-03-01 HISTORY — DX: Paroxysmal atrial fibrillation: I48.0

## 2023-03-01 NOTE — ED Triage Notes (Signed)
The pt reports that she has been in af since march she wsas supposed to have an abalation yesterday and it was cancelled due to equipment failure.  She has had dizziness  a period since nov 11th nausea and vomiting  nose bleeds

## 2023-03-02 ENCOUNTER — Emergency Department (HOSPITAL_COMMUNITY): Payer: Medicare Other

## 2023-03-02 ENCOUNTER — Encounter (HOSPITAL_COMMUNITY): Payer: Self-pay | Admitting: Internal Medicine

## 2023-03-02 ENCOUNTER — Observation Stay (HOSPITAL_COMMUNITY): Payer: Medicare Other

## 2023-03-02 DIAGNOSIS — E876 Hypokalemia: Secondary | ICD-10-CM

## 2023-03-02 DIAGNOSIS — R9431 Abnormal electrocardiogram [ECG] [EKG]: Secondary | ICD-10-CM

## 2023-03-02 DIAGNOSIS — D649 Anemia, unspecified: Principal | ICD-10-CM | POA: Diagnosis present

## 2023-03-02 DIAGNOSIS — E538 Deficiency of other specified B group vitamins: Secondary | ICD-10-CM

## 2023-03-02 DIAGNOSIS — N921 Excessive and frequent menstruation with irregular cycle: Secondary | ICD-10-CM

## 2023-03-02 LAB — BASIC METABOLIC PANEL
Anion gap: 11 (ref 5–15)
BUN: 9 mg/dL (ref 6–20)
CO2: 20 mmol/L — ABNORMAL LOW (ref 22–32)
Calcium: 7.9 mg/dL — ABNORMAL LOW (ref 8.9–10.3)
Chloride: 105 mmol/L (ref 98–111)
Creatinine, Ser: 0.71 mg/dL (ref 0.44–1.00)
GFR, Estimated: >60 mL/min (ref >60)
Glucose, Bld: 100 mg/dL — ABNORMAL HIGH (ref 70–99)
Potassium: 3.1 mmol/L — ABNORMAL LOW (ref 3.5–5.1)
Sodium: 136 mmol/L (ref 135–145)

## 2023-03-02 LAB — COMPREHENSIVE METABOLIC PANEL
ALT: 16 U/L (ref 0–44)
AST: 35 U/L (ref 15–41)
Albumin: 2.6 g/dL — ABNORMAL LOW (ref 3.5–5.0)
Alkaline Phosphatase: 55 U/L (ref 38–126)
Anion gap: 22 — ABNORMAL HIGH (ref 5–15)
BUN: 8 mg/dL (ref 6–20)
CO2: 20 mmol/L — ABNORMAL LOW (ref 22–32)
Calcium: 6.8 mg/dL — ABNORMAL LOW (ref 8.9–10.3)
Chloride: 97 mmol/L — ABNORMAL LOW (ref 98–111)
Creatinine, Ser: 0.79 mg/dL (ref 0.44–1.00)
GFR, Estimated: >60 mL/min (ref >60)
Glucose, Bld: 302 mg/dL — ABNORMAL HIGH (ref 70–99)
Potassium: 3.7 mmol/L (ref 3.5–5.1)
Sodium: 139 mmol/L (ref 135–145)
Total Bilirubin: 1.5 mg/dL — ABNORMAL HIGH (ref <1.2)
Total Protein: 5.6 g/dL — ABNORMAL LOW (ref 6.5–8.1)

## 2023-03-02 LAB — HEMOGLOBIN AND HEMATOCRIT, BLOOD
HCT: 22.9 % — ABNORMAL LOW (ref 36.0–46.0)
HCT: 26.3 % — ABNORMAL LOW (ref 36.0–46.0)
Hemoglobin: 7.5 g/dL — ABNORMAL LOW (ref 12.0–15.0)
Hemoglobin: 8.5 g/dL — ABNORMAL LOW (ref 12.0–15.0)

## 2023-03-02 LAB — HEPATIC FUNCTION PANEL
ALT: 17 U/L (ref 0–44)
AST: 47 U/L — ABNORMAL HIGH (ref 15–41)
Albumin: 2.8 g/dL — ABNORMAL LOW (ref 3.5–5.0)
Alkaline Phosphatase: 64 U/L (ref 38–126)
Bilirubin, Direct: 0.1 mg/dL (ref 0.0–0.2)
Indirect Bilirubin: 0.4 mg/dL (ref 0.3–0.9)
Total Bilirubin: 0.5 mg/dL (ref <1.2)
Total Protein: 6.4 g/dL — ABNORMAL LOW (ref 6.5–8.1)

## 2023-03-02 LAB — HEMOGLOBIN A1C
Hgb A1c MFr Bld: 5.4 % (ref 4.8–5.6)
Mean Plasma Glucose: 108.28 mg/dL

## 2023-03-02 LAB — RETICULOCYTES
Immature Retic Fract: 16.1 % — ABNORMAL HIGH (ref 2.3–15.9)
RBC.: 2.4 MIL/uL — ABNORMAL LOW (ref 3.87–5.11)
Retic Count, Absolute: 113 10*3/uL (ref 19.0–186.0)
Retic Ct Pct: 4.7 % — ABNORMAL HIGH (ref 0.4–3.1)

## 2023-03-02 LAB — CBC
HCT: 22.5 % — ABNORMAL LOW (ref 36.0–46.0)
Hemoglobin: 7 g/dL — ABNORMAL LOW (ref 12.0–15.0)
MCH: 28.9 pg (ref 26.0–34.0)
MCHC: 31.1 g/dL (ref 30.0–36.0)
MCV: 93 fL (ref 80.0–100.0)
Platelets: 291 10*3/uL (ref 150–400)
RBC: 2.42 MIL/uL — ABNORMAL LOW (ref 3.87–5.11)
RDW: 15.1 % (ref 11.5–15.5)
WBC: 7.1 10*3/uL (ref 4.0–10.5)
nRBC: 0.3 % — ABNORMAL HIGH (ref 0.0–0.2)

## 2023-03-02 LAB — PREPARE RBC (CROSSMATCH)

## 2023-03-02 LAB — CBC WITH DIFFERENTIAL/PLATELET
Abs Immature Granulocytes: 0.03 10*3/uL (ref 0.00–0.07)
Basophils Absolute: 0.1 10*3/uL (ref 0.0–0.1)
Basophils Relative: 1 %
Eosinophils Absolute: 0 10*3/uL (ref 0.0–0.5)
Eosinophils Relative: 0 %
HCT: 23.1 % — ABNORMAL LOW (ref 36.0–46.0)
Hemoglobin: 7.5 g/dL — ABNORMAL LOW (ref 12.0–15.0)
Immature Granulocytes: 0 %
Lymphocytes Relative: 17 %
Lymphs Abs: 1.7 10*3/uL (ref 0.7–4.0)
MCH: 29.4 pg (ref 26.0–34.0)
MCHC: 32.5 g/dL (ref 30.0–36.0)
MCV: 90.6 fL (ref 80.0–100.0)
Monocytes Absolute: 0.7 10*3/uL (ref 0.1–1.0)
Monocytes Relative: 7 %
Neutro Abs: 7.4 10*3/uL (ref 1.7–7.7)
Neutrophils Relative %: 75 %
Platelets: 235 10*3/uL (ref 150–400)
RBC: 2.55 MIL/uL — ABNORMAL LOW (ref 3.87–5.11)
RDW: 16.2 % — ABNORMAL HIGH (ref 11.5–15.5)
WBC: 9.9 10*3/uL (ref 4.0–10.5)
nRBC: 0 % (ref 0.0–0.2)

## 2023-03-02 LAB — MAGNESIUM: Magnesium: 1.2 mg/dL — ABNORMAL LOW (ref 1.7–2.4)

## 2023-03-02 LAB — GLUCOSE, CAPILLARY
Glucose-Capillary: 255 mg/dL — ABNORMAL HIGH (ref 70–99)
Glucose-Capillary: 317 mg/dL — ABNORMAL HIGH (ref 70–99)
Glucose-Capillary: 220 mg/dL — ABNORMAL HIGH (ref 70–99)
Glucose-Capillary: 219 mg/dL — ABNORMAL HIGH (ref 70–99)
Glucose-Capillary: 303 mg/dL — ABNORMAL HIGH (ref 70–99)

## 2023-03-02 LAB — IRON AND TIBC
Iron: 19 ug/dL — ABNORMAL LOW (ref 28–170)
Saturation Ratios: 4 % — ABNORMAL LOW (ref 10.4–31.8)
TIBC: 459 ug/dL — ABNORMAL HIGH (ref 250–450)
UIBC: 440 ug/dL

## 2023-03-02 LAB — VITAMIN B12: Vitamin B-12: 215 pg/mL (ref 180–914)

## 2023-03-02 LAB — MRSA NEXT GEN BY PCR, NASAL: MRSA by PCR Next Gen: NOT DETECTED

## 2023-03-02 LAB — HCG, SERUM, QUALITATIVE: Preg, Serum: NEGATIVE

## 2023-03-02 LAB — CBG MONITORING, ED: Glucose-Capillary: 197 mg/dL — ABNORMAL HIGH (ref 70–99)

## 2023-03-02 LAB — PROTIME-INR
INR: 1.4 — ABNORMAL HIGH (ref 0.8–1.2)
Prothrombin Time: 17.4 s — ABNORMAL HIGH (ref 11.4–15.2)

## 2023-03-02 LAB — ABO/RH: ABO/RH(D): O POS

## 2023-03-02 LAB — APTT: aPTT: 24 s (ref 24–36)

## 2023-03-02 LAB — TSH: TSH: 1.112 u[IU]/mL (ref 0.350–4.500)

## 2023-03-02 LAB — FOLATE: Folate: 4.7 ng/mL — ABNORMAL LOW (ref >5.9)

## 2023-03-02 LAB — TROPONIN I (HIGH SENSITIVITY)
Troponin I (High Sensitivity): 5 ng/L (ref <18)
Troponin I (High Sensitivity): <2 ng/L (ref <18)

## 2023-03-02 LAB — FERRITIN: Ferritin: 6 ng/mL — ABNORMAL LOW (ref 11–307)

## 2023-03-02 MED ORDER — SODIUM CHLORIDE 0.9% IV SOLUTION: Freq: Once | INTRAVENOUS | Status: AC

## 2023-03-02 MED ORDER — CYANOCOBALAMIN 1000 MCG/ML IJ SOLN
1000.0000 ug | Freq: Every day | INTRAMUSCULAR | Status: DC
  Administered 2023-03-02 – 2023-03-03 (×2): 1000 ug via INTRAMUSCULAR
  Filled 2023-03-02 (×2): qty 1

## 2023-03-02 MED ORDER — VENLAFAXINE HCL ER 37.5 MG PO CP24
37.5000 mg | ORAL_CAPSULE | Freq: Every day | ORAL | Status: DC
  Administered 2023-03-02 – 2023-03-03 (×2): 37.5 mg via ORAL
  Filled 2023-03-02 (×2): qty 1

## 2023-03-02 MED ORDER — ORAL CARE MOUTH RINSE: 15.0000 mL | OROMUCOSAL | Status: DC | PRN

## 2023-03-02 MED ORDER — FERROUS SULFATE 325 (65 FE) MG PO TABS
325.0000 mg | ORAL_TABLET | Freq: Every day | ORAL | Status: DC
  Administered 2023-03-03: 325 mg via ORAL
  Filled 2023-03-02: qty 1

## 2023-03-02 MED ORDER — INSULIN GLARGINE-YFGN 100 UNIT/ML ~~LOC~~ SOLN: 8.0000 [IU] | Freq: Every day | SUBCUTANEOUS | Status: DC

## 2023-03-02 MED ORDER — INSULIN GLARGINE-YFGN 100 UNIT/ML ~~LOC~~ SOLN
5.0000 [IU] | Freq: Every day | SUBCUTANEOUS | Status: DC
Start: 2023-03-02 — End: 2023-03-02
  Filled 2023-03-02: qty 0.05

## 2023-03-02 MED ORDER — ACETAMINOPHEN 325 MG PO TABS
650.0000 mg | ORAL_TABLET | Freq: Four times a day (QID) | ORAL | Status: DC | PRN
  Administered 2023-03-02: 650 mg via ORAL
  Filled 2023-03-02: qty 2

## 2023-03-02 MED ORDER — NORETHINDRONE ACETATE 5 MG PO TABS
10.0000 mg | ORAL_TABLET | Freq: Every day | ORAL | Status: DC
  Administered 2023-03-02 – 2023-03-03 (×2): 10 mg via ORAL
  Filled 2023-03-02 (×2): qty 2

## 2023-03-02 MED ORDER — ACETAMINOPHEN 650 MG RE SUPP: 650.0000 mg | Freq: Four times a day (QID) | RECTAL | Status: DC | PRN

## 2023-03-02 MED ORDER — METOPROLOL TARTRATE 50 MG PO TABS
50.0000 mg | ORAL_TABLET | Freq: Two times a day (BID) | ORAL | Status: DC
Start: 2023-03-02 — End: 2023-03-03
  Administered 2023-03-02 – 2023-03-03 (×3): 50 mg via ORAL
  Filled 2023-03-02 (×3): qty 1

## 2023-03-02 MED ORDER — INSULIN ASPART 100 UNIT/ML IJ SOLN
0.0000 [IU] | Freq: Every day | INTRAMUSCULAR | Status: DC
  Administered 2023-03-02: 4 [IU] via SUBCUTANEOUS

## 2023-03-02 MED ORDER — FOLIC ACID 1 MG PO TABS
1.0000 mg | ORAL_TABLET | Freq: Every day | ORAL | Status: DC
  Administered 2023-03-02 – 2023-03-03 (×2): 1 mg via ORAL
  Filled 2023-03-02 (×2): qty 1

## 2023-03-02 MED ORDER — INSULIN ASPART 100 UNIT/ML IJ SOLN
0.0000 [IU] | Freq: Three times a day (TID) | INTRAMUSCULAR | Status: DC
  Administered 2023-03-02: 5 [IU] via SUBCUTANEOUS
  Administered 2023-03-02 – 2023-03-03 (×2): 11 [IU] via SUBCUTANEOUS
  Administered 2023-03-03: 5 [IU] via SUBCUTANEOUS

## 2023-03-02 MED ORDER — GABAPENTIN 300 MG PO CAPS
600.0000 mg | ORAL_CAPSULE | Freq: Every day | ORAL | Status: DC | PRN
Start: 2023-03-02 — End: 2023-03-03

## 2023-03-02 MED ORDER — SALINE SPRAY 0.65 % NA SOLN
1.0000 | Freq: Every day | NASAL | Status: DC
  Administered 2023-03-02: 1 via NASAL
  Filled 2023-03-02: qty 44

## 2023-03-02 MED ORDER — POTASSIUM CHLORIDE CRYS ER 20 MEQ PO TBCR
40.0000 meq | EXTENDED_RELEASE_TABLET | Freq: Once | ORAL | Status: AC
  Administered 2023-03-02: 40 meq via ORAL
  Filled 2023-03-02: qty 2

## 2023-03-02 MED ORDER — INSULIN GLARGINE-YFGN 100 UNIT/ML ~~LOC~~ SOLN
5.0000 [IU] | Freq: Every day | SUBCUTANEOUS | Status: DC
Start: 2023-03-02 — End: 2023-03-03
  Administered 2023-03-02 – 2023-03-03 (×2): 5 [IU] via SUBCUTANEOUS
  Filled 2023-03-02 (×2): qty 0.05

## 2023-03-02 MED ORDER — INSULIN ASPART 100 UNIT/ML IJ SOLN: 0.0000 [IU] | Freq: Three times a day (TID) | INTRAMUSCULAR | Status: DC

## 2023-03-02 MED ORDER — SUMATRIPTAN SUCCINATE 100 MG PO TABS: 100.0000 mg | ORAL_TABLET | ORAL | Status: DC | PRN

## 2023-03-02 MED ORDER — MELATONIN 3 MG PO TABS
3.0000 mg | ORAL_TABLET | Freq: Every evening | ORAL | Status: DC | PRN
  Administered 2023-03-02: 3 mg via ORAL
  Filled 2023-03-02: qty 1

## 2023-03-02 MED ORDER — MAGNESIUM SULFATE 2 GM/50ML IV SOLN
2.0000 g | Freq: Once | INTRAVENOUS | Status: AC
  Administered 2023-03-02: 2 g via INTRAVENOUS
  Filled 2023-03-02: qty 50

## 2023-03-02 MED ORDER — DRONEDARONE HCL 400 MG PO TABS
400.0000 mg | ORAL_TABLET | Freq: Two times a day (BID) | ORAL | Status: DC
  Administered 2023-03-02 – 2023-03-03 (×3): 400 mg via ORAL
  Filled 2023-03-02 (×4): qty 1

## 2023-03-02 MED ORDER — NORETHINDRONE ACETATE 5 MG PO TABS: 10.0000 mg | ORAL_TABLET | Freq: Every day | ORAL | Status: DC

## 2023-03-02 NOTE — ED Notes (Signed)
Pt receiving blood at this time morning labs to be postponed until 2 hrs after blood transfusion is completed

## 2023-03-02 NOTE — Assessment & Plan Note (Signed)
A1C 7.4 in March of 2024 repeat pending On Novolin N/R Moderate SSI and long acting insulin per weight based dosage

## 2023-03-02 NOTE — ED Provider Notes (Signed)
[place] [facility]GENCY DEPARTMENT AT [facility] Provider Note   CSN: [id number] Arrival date & time: 03/01/23  2341     History  Chief Complaint  Patient presents with   Dizziness    Katie Hampton is a 42 y.o. female.  The history is provided by the patient.  Dizziness Katie Hampton is a 42 y.o. female who presents to the Emergency Department complaining of dizziness.  She presents to the emergency department for evaluation of dizziness that has been ongoing for the last several months.  Overall episodes are worsening and today she felt like she was going to pass out when she was sitting on the toilet.  She also has significant shortness of breath and dyspnea on exertion with some associated chest discomfort.  She has a history of atrial fibrillation and was scheduled for an ablation yesterday but it was canceled due to to equipment issue.  She continues to take Eliquis.  She did have an episode of emesis today and had an associated nosebleed.  The nosebleed did stop relatively quickly.  She has been experiencing bilateral intermittent nosebleeds for the last 2 weeks, every 1 to 3 days.  She also reports a heavy menstrual cycle that started on November 13.  She describes it as heavy bleeding and uses about 7 pads a day and passing clots about the size of a grape.  She has had heavier cycles in the past.  She has not yet seen gynecology for her vaginal bleeding  Home Medications Prior to Admission medications   Medication Sig Start Date End Date Taking? Authorizing Provider  acetaminophen (TYLENOL) 500 MG tablet Take 500-1,000 mg by mouth every 6 (six) hours as needed (pain.).   Yes [provider]  apixaban (ELIQUIS) 5 MG TABS tablet Take 1 tablet (5 mg total) by mouth 2 (two) times daily. 01/06/23  Yes Sherie Don, NP  desvenlafaxine (PRISTIQ) 25 MG 24 hr tablet Take 25 mg by mouth every evening.   Yes [provider]  dronedarone (MULTAQ) 400 MG  tablet Take 1 tablet (400 mg total) by mouth 2 (two) times daily with a meal. 02/20/23  Yes Sherie Don, NP  gabapentin (NEURONTIN) 600 MG tablet Take 600 mg by mouth daily as needed (nerve pain). 01/30/21  Yes [provider]  insulin NPH (HUMULIN N,NOVOLIN N) 100 UNIT/ML injection Inject 10-20 Units into the skin 2 (two) times daily before a meal. Inject 20 units subcuteanously in the morning, and inject 10-12 units subcutaneoulsy in the evening.   Yes [provider]  insulin regular (NOVOLIN R,HUMULIN R) 100 units/mL injection Inject 6-20 Units into the skin 2 (two) times daily before a meal. Per patients sliding scale: breakfast 18-20 units, supper 6-10   Yes [provider]  metoprolol tartrate (LOPRESSOR) 50 MG tablet Take 50 mg by mouth 2 (two) times daily.   Yes [provider]  SUMAtriptan (IMITREX) 100 MG tablet Take 100 mg by mouth every 2 (two) hours as needed for migraine. 05/03/22  Yes [provider]  Vitamin D, Ergocalciferol, (DRISDOL) 1.25 MG (50000 UNIT) CAPS capsule Take 50,000 Units by mouth every Wednesday. 05/03/22  Yes [provider]      Allergies    Flecainide    Review of Systems   Review of Systems  Neurological:  Positive for dizziness.  All other systems reviewed and are negative.   Physical Exam Updated Vital Signs BP 132/66 (BP Location: Right Wrist)   Pulse 84  Temp 98 F (36.7 C) (Oral)   Resp 20   Ht 5\' 3"  (1.6 m)   Wt 101.2 kg   LMP 03/01/2023 (Exact Date)   SpO2 99%   BMI 39.52 kg/m  Physical Exam Vitals and nursing note reviewed.  Constitutional:      Appearance: She is well-developed.  HENT:     Head: Normocephalic and atraumatic.     Comments: Dried blood in left nare.  No blood in posterior op.  Cardiovascular:     Rate and Rhythm: Normal rate. Rhythm irregular.     Heart sounds: No murmur heard. Pulmonary:     Effort: Pulmonary effort is normal. No respiratory distress.      Breath sounds: Normal breath sounds.  Abdominal:     Palpations: Abdomen is soft.     Tenderness: There is no abdominal tenderness. There is no guarding or rebound.  Musculoskeletal:        General: No tenderness.     Comments: Trace edema to ble  Skin:    General: Skin is warm and dry.     Coloration: Skin is pale.  Neurological:     Mental Status: She is alert and oriented to person, place, and time.  Psychiatric:        Behavior: Behavior normal.     ED Results / Procedures / Treatments   Labs (all labs ordered are listed, but only abnormal results are displayed) Labs Reviewed  BASIC METABOLIC PANEL - Abnormal; Notable for the following components:      Result Value   Potassium 3.1 (*)    CO2 20 (*)    Glucose, Bld 100 (*)    Calcium 7.9 (*)    All other components within normal limits  CBC - Abnormal; Notable for the following components:   RBC 2.42 (*)    Hemoglobin 7.0 (*)    HCT 22.5 (*)    nRBC 0.3 (*)    All other components within normal limits  HEPATIC FUNCTION PANEL - Abnormal; Notable for the following components:   Total Protein 6.4 (*)    Albumin 2.8 (*)    AST 47 (*)    All other components within normal limits  PROTIME-INR - Abnormal; Notable for the following components:   Prothrombin Time 17.4 (*)    INR 1.4 (*)    All other components within normal limits  IRON AND TIBC - Abnormal; Notable for the following components:   Iron 19 (*)    TIBC 459 (*)    Saturation Ratios 4 (*)    All other components within normal limits  FERRITIN - Abnormal; Notable for the following components:   Ferritin 6 (*)    All other components within normal limits  RETICULOCYTES - Abnormal; Notable for the following components:   Retic Ct Pct 4.7 (*)    RBC. 2.40 (*)    Immature Retic Fract 16.1 (*)    All other components within normal limits  FOLATE - Abnormal; Notable for the following components:   Folate 4.7 (*)    All other components within normal limits   GLUCOSE, CAPILLARY - Abnormal; Notable for the following components:   Glucose-Capillary 255 (*)    All other components within normal limits  CBG MONITORING, ED - Abnormal; Notable for the following components:   Glucose-Capillary 197 (*)    All other components within normal limits  MRSA NEXT GEN BY PCR, NASAL  HCG, SERUM, QUALITATIVE  VITAMIN B12  CBC WITH DIFFERENTIAL/PLATELET  COMPREHENSIVE METABOLIC PANEL  MAGNESIUM  HEMOGLOBIN AND HEMATOCRIT, BLOOD  HEMOGLOBIN AND HEMATOCRIT, BLOOD  APTT  TYPE AND SCREEN  ABO/RH  PREPARE RBC (CROSSMATCH)  TROPONIN I (HIGH SENSITIVITY)  TROPONIN I (HIGH SENSITIVITY)    EKG EKG Interpretation Date/Time:  Sunday March 02 2023 00:03:32 EST Ventricular Rate:  82 PR Interval:    QRS Duration:  92 QT Interval:  414 QTC Calculation: 483 R Axis:   16  Text Interpretation: Atrial fibrillation Nonspecific T wave abnormality Prolonged QT Abnormal ECG Confirmed by Tilden Fossa [phone]) on 03/02/2023 1:54:28 AM  Radiology DG Chest 2 View Result Date: 03/02/2023 CLINICAL DATA:  Chest pain, nausea, EXAM: CHEST - 2 VIEW COMPARISON:  None Available. FINDINGS: The heart size and mediastinal contours are within normal limits. Both lungs are clear. The visualized skeletal structures are unremarkable. IMPRESSION: No active cardiopulmonary disease. Electronically Signed   By: Helyn Numbers M.D.   On: 03/02/2023 00:33    Procedures Procedures    Medications Ordered in ED Medications  acetaminophen (TYLENOL) tablet 650 mg (has no administration in time range)    Or  acetaminophen (TYLENOL) suppository 650 mg (has no administration in time range)  melatonin tablet 3 mg (has no administration in time range)  0.9 %  sodium chloride infusion (Manually program via Guardrails IV Fluids) ( Intravenous New Bag/Given 03/02/23 0301)  potassium chloride SA (KLOR-CON M) CR tablet 40 mEq (40 mEq Oral Given 03/02/23 0235)    ED Course/ Medical Decision  Making/ A&P                                 Medical Decision Making Amount and/or Complexity of Data Reviewed Labs: ordered.  Risk Prescription drug management. Decision regarding hospitalization.   Patient with history of diabetes, A-fib on anticoagulation here for evaluation of dizziness, near syncopal episode.  Her hemoglobin is downtrending to 7 from 11 on November 25.  Suspect anemia is driving her symptoms.  Presentation is not consistent with PE.  She is mildly hypokalemic, will replace orally.  Given significant drop in hemoglobin with persistent bleeding plan to transfuse.  Plan to admit for ongoing care.  Hospitalist consulted for admission.  Patient updated findings of studies and recommendation for admission and she is in agreement treatment plan. D/w Dr. Berton Lan with gynecology regarding vaginal bleeding - recommends starting Aygestin 10mg  daily to help suppress vaginal bleeding.         Final Clinical Impression(s) / ED Diagnoses Final diagnoses:  Symptomatic anemia    Rx / DC Orders ED Discharge Orders     None         Tilden Fossa, MD 03/02/23 [phone]

## 2023-03-02 NOTE — Care Management Obs Status (Signed)
MEDICARE OBSERVATION STATUS NOTIFICATION   Patient Details  Name: Katie Hampton MRN: [id number] Date of Birth: 1980/09/22   Medicare Observation Status Notification Given:  Yes    Ronny Bacon, RN 03/02/2023, 3:04 PM

## 2023-03-02 NOTE — ED Notes (Signed)
ED TO INPATIENT HANDOFF REPORT  ED Nurse Name and Phone #: [phone]  S Name/Age/Gender Katie Hampton 42 y.o. female Room/Bed: 016C/016C  Code Status   Code Status: Full Code  Home/SNF/Other Home Patient oriented to: self, place, time, and situation Is this baseline? Yes   Triage Complete: Triage complete  Chief Complaint Symptomatic anemia [D64.9]  Triage Note The pt reports that she has been in af since march she wsas supposed to have an abalation yesterday and it was cancelled due to equipment failure.  She has had dizziness  a period since nov 11th nausea and vomiting  nose bleeds    Allergies Allergies  Allergen Reactions   Flecainide Other (See Comments)    Prolonged QTC    Level of Care/Admitting Diagnosis ED Disposition     ED Disposition  Admit   Condition  --   Comment  Hospital Area: [facility] [[id number]]  Level of Care: Progressive [102]  Admit to Progressive based on following criteria: MULTISYSTEM THREATS such as stable sepsis, metabolic/electrolyte imbalance with or without encephalopathy that is responding to early treatment.  May place patient in observation at [facility] or [facility] if equivalent level of care is available:: No  Covid Evaluation: Asymptomatic - no recent exposure (last 10 days) testing not required  Diagnosis: Symptomatic anemia [[id number]]  Admitting Physician: Angie Fava [[id number]]  Attending Physician: Angie Fava [[id number]]          B Medical/Surgery History Past Medical History:  Diagnosis Date   AF (paroxysmal atrial fibrillation) (HCC)    Diabetes mellitus    Type 1   Pyelonephritis    Past Surgical History:  Procedure Laterality Date   CARDIOVERSION N/A 09/30/2022   Procedure: CARDIOVERSION;  Surgeon: Iran Ouch, MD;  Location: [facility] ORS;  Service: Cardiovascular;  Laterality: N/A;   CARDIOVERSION N/A 10/07/2022   Procedure: CARDIOVERSION;  Surgeon: Regan Lemming,  MD;  Location: [facility] INVASIVE CV LAB;  Service: Cardiovascular;  Laterality: N/A;   CHOLECYSTECTOMY     EYE SURGERY Bilateral    TUBAL LIGATION Bilateral      A IV Location/Drains/Wounds Patient Lines/Drains/Airways Status     Active Line/Drains/Airways     Name Placement date Placement time Site Days   Peripheral IV 03/02/23 22 G Left Antecubital 03/02/23  0248  Antecubital  less than 1   Closed System Drain 1 Inferior;Right Back 05/28/22  1200  Back  278   Closed System Drain 1 Inferior;Lateral;Right Back 05/29/22  1519  Back  277            Intake/Output Last 24 hours No intake or output data in the 24 hours ending 03/02/23 0445  Labs/Imaging Results for orders placed or performed during the hospital encounter of 03/01/23 (from the past 48 hours)  Type and screen [facility]     Status: None (Preliminary result)   Collection Time: 03/02/23 12:15 AM  Result Value Ref Range   ABO/RH(D) O POS    Antibody Screen NEG    Sample Expiration 03/05/2023,2359    Unit Number [id number]    Blood Component Type RED CELLS,LR    Unit division 00    Status of Unit ISSUED    Transfusion Status OK TO TRANSFUSE    Crossmatch Result      Compatible Performed at [facility], [address] [place], [place] [address]   ABO/Rh     Status: None   Collection Time: 03/02/23  12:25 AM  Result Value Ref Range   ABO/RH(D)      O POS Performed at [facility], [address] [place], [place] [address]   Basic metabolic panel     Status: Abnormal   Collection Time: 03/02/23 12:38 AM  Result Value Ref Range   Sodium 136 135 - 145 mmol/L   Potassium 3.1 (L) 3.5 - 5.1 mmol/L   Chloride 105 98 - 111 mmol/L   CO2 20 (L) 22 - 32 mmol/L   Glucose, Bld 100 (H) 70 - 99 mg/dL    Comment: Glucose reference range applies only to samples taken after fasting for at least 8 hours.   BUN 9 6 - 20 mg/dL   Creatinine, Ser 8.11 0.44 - 1.00 mg/dL   Calcium 7.9 (L) 8.9 -  10.3 mg/dL   GFR, Estimated >91 >47 mL/min    Comment: (NOTE) Calculated using the CKD-EPI Creatinine Equation (2021)    Anion gap 11 5 - 15    Comment: Performed at [facility], [address], [place], [place] [address]  CBC     Status: Abnormal   Collection Time: 03/02/23 12:38 AM  Result Value Ref Range   WBC 7.1 4.0 - 10.5 K/uL   RBC 2.42 (L) 3.87 - 5.11 MIL/uL   Hemoglobin 7.0 (L) 12.0 - 15.0 g/dL   HCT 21.3 (L) 08.6 - 57.8 %   MCV 93.0 80.0 - 100.0 fL   MCH 28.9 26.0 - 34.0 pg   MCHC 31.1 30.0 - 36.0 g/dL   RDW 46.9 62.9 - 52.8 %   Platelets 291 150 - 400 K/uL   nRBC 0.3 (H) 0.0 - 0.2 %    Comment: Performed at [facility], [address], [place], [place] [address]  Troponin I (High Sensitivity)     Status: None   Collection Time: 03/02/23 12:38 AM  Result Value Ref Range   Troponin I (High Sensitivity) 5 <18 ng/L    Comment: (NOTE) Elevated high sensitivity troponin I (hsTnI) values and significant  changes across serial measurements may suggest ACS but many other  chronic and acute conditions are known to elevate hsTnI results.  Refer to the "Links" section for chest pain algorithms and additional  guidance. Performed at [facility], [address], [place], [place] [address]   hCG, serum, qualitative     Status: None   Collection Time: 03/02/23 12:38 AM  Result Value Ref Range   Preg, Serum NEGATIVE NEGATIVE    Comment:        THE SENSITIVITY OF THIS METHODOLOGY IS >10 mIU/mL. Performed at [facility], [address] [place], [place] [address]   Hepatic function panel     Status: Abnormal   Collection Time: 03/02/23 12:38 AM  Result Value Ref Range   Total Protein 6.4 (L) 6.5 - 8.1 g/dL   Albumin 2.8 (L) 3.5 - 5.0 g/dL   AST 47 (H) 15 - 41 U/L   ALT 17 0 - 44 U/L   Alkaline Phosphatase 64 38 - 126 U/L   Total Bilirubin 0.5 <1.2 mg/dL   Bilirubin, Direct 0.1 0.0 - 0.2 mg/dL   Indirect Bilirubin 0.4 0.3 - 0.9 mg/dL     Comment: Performed at [facility], [address], [place], [place] [address]  Protime-INR     Status: Abnormal   Collection Time: 03/02/23 12:38 AM  Result Value Ref Range   Prothrombin Time  17.4 (H) 11.4 - 15.2 seconds   INR 1.4 (H) 0.8 - 1.2    Comment: (NOTE) INR goal varies based on device and disease states. Performed at [facility], [address] [place], [place] [address]   Iron and TIBC     Status: Abnormal   Collection Time: 03/02/23 12:38 AM  Result Value Ref Range   Iron 19 (L) 28 - 170 ug/dL   TIBC 846 (H) 962 - 952 ug/dL   Saturation Ratios 4 (L) 10.4 - 31.8 %   UIBC 440 ug/dL    Comment: Performed at [facility], [address] [place], [place] [address]  Ferritin     Status: Abnormal   Collection Time: 03/02/23 12:38 AM  Result Value Ref Range   Ferritin 6 (L) 11 - 307 ng/mL    Comment: Performed at [facility], [address], [place], [place] [address]  Reticulocytes     Status: Abnormal   Collection Time: 03/02/23 12:38 AM  Result Value Ref Range   Retic Ct Pct 4.7 (H) 0.4 - 3.1 %   RBC. 2.40 (L) 3.87 - 5.11 MIL/uL   Retic Count, Absolute 113.0 19.0 - 186.0 K/uL   Immature Retic Fract 16.1 (H) 2.3 - 15.9 %    Comment: Performed at [facility], [address] [place], [place] [address]  Folate     Status: Abnormal   Collection Time: 03/02/23 12:38 AM  Result Value Ref Range   Folate 4.7 (L) >5.9 ng/mL    Comment: Performed at [facility], [address] [place], [place] [address]  Vitamin B12     Status: None   Collection Time: 03/02/23 12:38 AM  Result Value Ref Range   Vitamin B-12 215 180 - 914 pg/mL    Comment: (NOTE) This assay is not validated for testing neonatal or myeloproliferative syndrome specimens for Vitamin B12 levels. Performed at [facility], [address], [place], [place] [address]   Prepare RBC (crossmatch)     Status: None   Collection Time: 03/02/23  2:20 AM  Result Value  Ref Range   Order Confirmation      ORDER PROCESSED BY BLOOD BANK Performed at [facility], [address] [place], [place] [address]   CBG monitoring, ED     Status: Abnormal   Collection Time: 03/02/23  2:51 AM  Result Value Ref Range   Glucose-Capillary 197 (H) 70 - 99 mg/dL    Comment: Glucose reference range applies only to samples taken after fasting for at least 8 hours.  Troponin I (High Sensitivity)     Status: None   Collection Time: 03/02/23  2:56 AM  Result Value Ref Range   Troponin I (High Sensitivity) <2 <18 ng/L    Comment: (NOTE) Elevated high sensitivity troponin I (hsTnI) values and significant  changes across serial measurements may suggest ACS but many other  chronic and acute conditions are known to elevate hsTnI results.  Refer to the "Links" section for chest pain algorithms and additional  guidance. Performed at [facility], [address] [place], [place] [address]    DG Chest 2 View Result Date: 03/02/2023 CLINICAL DATA:  Chest pain, nausea, EXAM: CHEST - 2 VIEW COMPARISON:  None Available. FINDINGS: The heart size and mediastinal contours are within normal limits. Both lungs are clear. The visualized skeletal structures are unremarkable. IMPRESSION: No active cardiopulmonary disease. Electronically Signed  By: Helyn Numbers M.D.   On: 03/02/2023 00:33    Pending Labs Unresulted Labs (From admission, onward)     Start     Ordered   03/02/23 1300  Hemoglobin and hematocrit, blood  Once-Timed,   TIMED        03/02/23 0312   03/02/23 0900  Hemoglobin and hematocrit, blood  Once-Timed,   TIMED        03/02/23 0312   03/02/23 0500  CBC with Differential/Platelet  Tomorrow morning,   R        03/02/23 0311   03/02/23 0500  Comprehensive metabolic panel  Tomorrow morning,   R        03/02/23 0311   03/02/23 0500  Magnesium  Tomorrow morning,   R        03/02/23 0311   03/02/23 0313  APTT  Add-on,   AD        03/02/23 0313             Vitals/Pain Today's Vitals   03/02/23 0301 03/02/23 0301 03/02/23 0305 03/02/23 0316  BP:   (!) 153/73 132/70  Pulse:   86 82  Resp:   16 18  Temp:  98.2 F (36.8 C)  97.8 F (36.6 C)  TempSrc:  Oral  Oral  SpO2:   100% 100%  Weight:      Height:      PainSc: 0-No pain   0-No pain    Isolation Precautions No active isolations  Medications Medications  acetaminophen (TYLENOL) tablet 650 mg (has no administration in time range)    Or  acetaminophen (TYLENOL) suppository 650 mg (has no administration in time range)  melatonin tablet 3 mg (has no administration in time range)  0.9 %  sodium chloride infusion (Manually program via Guardrails IV Fluids) ( Intravenous New Bag/Given 03/02/23 0301)  potassium chloride SA (KLOR-CON M) CR tablet 40 mEq (40 mEq Oral Given 03/02/23 0235)    Mobility walks     Focused Assessments     R Recommendations: See Admitting Provider Note  Report given to:   Additional Notes: Patient is alert oriented , currently getting 1 unit of PRBC

## 2023-03-02 NOTE — Assessment & Plan Note (Signed)
Optimize electrolytes Keep on telemetry Avoid qt prolonging drugs  Repeat ekg in AM   

## 2023-03-02 NOTE — Assessment & Plan Note (Addendum)
Check TSH, von willebrand panel  and complete pelvic US GYN recommended Aygestin 10mg  daily to help suppress vaginal bleeding  [facility] ([facility] will get her f/u at their office

## 2023-03-02 NOTE — Assessment & Plan Note (Signed)
Start replacement

## 2023-03-02 NOTE — Progress Notes (Signed)
  Carryover admission to the Day Admitter.  I discussed this case with the EDP, Dr. Madilyn Hook.  Per these discussions:   This is a 42 year old female with history of paroxysmal atrial fibrillation chronically anticoagulated on Eliquis, history of epistaxis, who is being admitted with symptomatic anemia after presenting with dyspnea on exertion as well as dizziness and presyncope over the last few days.  No syncope. She conveys that she has been experiencing intermittent vaginal bleeding over the course of the last month.  Hemoglobin today is 7.0 compared to 10.9 on 02/10/2023, 12.9 on 01/06/2023, and 15.9 on 10/12/2022.   Systolic blood pressures in the 130s mmHg; heart rates in the 70s to 80s, both caveat that the patient is on a BB at home.   EDP has reached out to on-call OB-Gyn who are currently with a critical patient but conveyed that they will call Dr Madilyn Hook back shortly to further discuss and convey their recommendations.   The patient is currently being transfused 1 unit PRBC.  Iron studies have been added on, and results of such are currently pending.  I have placed an order for observation for further evaluation management of the above.  I have placed some additional preliminary admit orders via the adult multi-morbid admission order set. I have also ordered, PTT, INR.  Ordered morning labs in the form of CMP, CBC, magnesium level.  Also ordered acute 4-hour H&H trending through 1300 today.    Katie Pigg, DO Hospitalist

## 2023-03-02 NOTE — Assessment & Plan Note (Signed)
She has a statin, but has not been taking Will start back when she gets home.

## 2023-03-02 NOTE — Assessment & Plan Note (Addendum)
Has failed x 2 cardioversions and planned for ablation on 12/17, but cancelled by EP  Repeat EKG in NSR Continue lopressor and multaq Cha2ds2-vasc score of 2.  Hold eliquis x 1 day Hopefully after ablation can stop eliquis with menometrorrhagia.

## 2023-03-02 NOTE — Assessment & Plan Note (Signed)
Start IM replacement while in patient

## 2023-03-02 NOTE — H&P (Signed)
History and Physical    Patient: Katie Hampton UJW:[id number] DOB: May 27, 1980 DOA: 03/01/2023 DOS: the patient was seen and examined on 03/02/2023 PCP: [facility]  Patient coming from: Home - lives with her husband and her son.    Chief Complaint: dizziness and near syncope   HPI: Avonna Meservey is a 42 y.o. female with medical history significant of atrial fibrillation on eliquis, T1DM, HLD, peripheral neuropathy who presented to ED after near syncopal event.  She was in the bathroom and started to bleed from her nose and was having heavy bleeding vaginally and felt like she was going to pass out.  She also had one episode of emesis. She also has noticed increasing fatigue and weakness and shortness of breath. Since she almost passed out it prompted her to come to ED.  She has been on her period since 11/13. She also started to have nosebleeds about 2 weeks ago.  She has never had nosebleeds before.   She has never had an abnormal cycle into 3-4 months ago after starting the eliquis. Her periods are typically 3-4 days with normal flow, but since the eliquis they are lasting 7 days and extremely heavy. She was going through 10 pads on her heaviest days.   She only had 2 weeks of no vaginally bleeding between September to November 13th and now has been bleeding since the 11/13.   She has been on eliquis since June or July. Last dose yesterday evening around 7pm.    Denies any fever/chills, vision changes/headaches, chest pain ,+palpitations, cough, abdominal pain, N/V/D, dysuria or leg swelling.    She does not smoke or drink alcohol.   ER Course:  vitals: afebrile, bp: 162/102, HR: 84, RR: 20, oxygen: 100%RA Pertinent labs: hgb: 7.0 (10.9 two weeks ago), INR: 1.4, ferritin: 6,iron: 19, TIBC: 459, saturation: 4,  folate; 4.7, B12: 215, potassium: 3.1 CXR: no acute finding In ED: transfused one unit PRBC. EDP discussed with GYN vaginal bleeding. Recommended Aygestin 10mg   daily to suppress vaginal bleeding.   Review of Systems: As mentioned in the history of present illness. All other systems reviewed and are negative. Past Medical History:  Diagnosis Date   AF (paroxysmal atrial fibrillation) (HCC)    Diabetes mellitus    Type 1   Pyelonephritis    Past Surgical History:  Procedure Laterality Date   CARDIOVERSION N/A 09/30/2022   Procedure: CARDIOVERSION;  Surgeon: Iran Ouch, MD;  Location: [facility] ORS;  Service: Cardiovascular;  Laterality: N/A;   CARDIOVERSION N/A 10/07/2022   Procedure: CARDIOVERSION;  Surgeon: Regan Lemming, MD;  Location: [facility] INVASIVE CV [facility];  Service: Cardiovascular;  Laterality: N/A;   CHOLECYSTECTOMY     EYE SURGERY Bilateral    TUBAL LIGATION Bilateral    Social History:  reports that she has quit smoking. Her smoking use included cigarettes. She has never used smokeless tobacco. She reports current alcohol use. She reports that she does not use drugs.  Allergies  Allergen Reactions   Flecainide Other (See Comments)    Prolonged QTC    Family History  Problem Relation Age of Onset   Kidney disease Sister    Colon cancer Maternal Aunt     Prior to Admission medications   Medication Sig Start Date End Date Taking? Authorizing Provider  acetaminophen (TYLENOL) 500 MG tablet Take 500-1,000 mg by mouth every 6 (six) hours as needed (pain.).   Yes [provider]  apixaban (ELIQUIS) 5 MG TABS tablet Take 1 tablet (  5 mg total) by mouth 2 (two) times daily. 01/06/23  Yes Sherie Don, NP  desvenlafaxine (PRISTIQ) 25 MG 24 hr tablet Take 25 mg by mouth every evening.   Yes [provider]  dronedarone (MULTAQ) 400 MG tablet Take 1 tablet (400 mg total) by mouth 2 (two) times daily with a meal. 02/20/23  Yes Sherie Don, NP  gabapentin (NEURONTIN) 600 MG tablet Take 600 mg by mouth daily as needed (nerve pain). 01/30/21  Yes [provider]  insulin NPH (HUMULIN N,NOVOLIN N) 100 UNIT/ML  injection Inject 10-20 Units into the skin 2 (two) times daily before a meal. Inject 20 units subcuteanously in the morning, and inject 10-12 units subcutaneoulsy in the evening.   Yes [provider]  insulin regular (NOVOLIN R,HUMULIN R) 100 units/mL injection Inject 6-20 Units into the skin 2 (two) times daily before a meal. Per patients sliding scale: breakfast 18-20 units, supper 6-10   Yes [provider]  metoprolol tartrate (LOPRESSOR) 50 MG tablet Take 50 mg by mouth 2 (two) times daily.   Yes [provider]  SUMAtriptan (IMITREX) 100 MG tablet Take 100 mg by mouth every 2 (two) hours as needed for migraine. 05/03/22  Yes [provider]  Vitamin D, Ergocalciferol, (DRISDOL) 1.25 MG (50000 UNIT) CAPS capsule Take 50,000 Units by mouth every Wednesday. 05/03/22  Yes [provider]    Physical Exam: Vitals:   03/02/23 0622 03/02/23 0748 03/02/23 1006 03/02/23 1114  BP:  125/61 124/65 (!) 121/57  Pulse: 75 81 81 77  Resp:  16  14  Temp: 98.3 F (36.8 C) 98.8 F (37.1 C)  98.7 F (37.1 C)  TempSrc: Oral Oral  Oral  SpO2:  99%  99%  Weight:      Height:       General:  Appears calm and comfortable and is in NAD. Pale appearing.  Eyes:  PERRL, EOMI, normal lids, iris ENT:  grossly normal hearing, lips & tongue, mmm; appropriate dentition Neck:  no LAD, masses or thyromegaly; no carotid bruits Cardiovascular:  RRR, no m/r/g. No LE edema.  Respiratory:   CTA bilaterally with no wheezes/rales/rhonchi.  Normal respiratory effort. Abdomen:  soft, NT, ND, NABS Back:   normal alignment, no CVAT Skin:  no rash or induration seen on limited exam Musculoskeletal:  grossly normal tone BUE/BLE, good ROM, no bony abnormality Lower extremity:  No LE edema.  Limited foot exam with no ulcerations.  2+ distal pulses. Psychiatric:  grossly normal mood and affect, speech fluent and appropriate, AOx3 Neurologic:  CN 2-12 grossly intact, moves all  extremities in coordinated fashion, sensation intact   Radiological Exams on Admission: Independently reviewed - see discussion in A/P where applicable  DG Chest 2 View Result Date: 03/02/2023 CLINICAL DATA:  Chest pain, nausea, EXAM: CHEST - 2 VIEW COMPARISON:  None Available. FINDINGS: The heart size and mediastinal contours are within normal limits. Both lungs are clear. The visualized skeletal structures are unremarkable. IMPRESSION: No active cardiopulmonary disease. Electronically Signed   By: Helyn Numbers M.D.   On: 03/02/2023 00:33    EKG: Independently reviewed.  Atrial fibrillation with rate 82; nonspecific ST changes with no evidence of acute ischemia. Prolonged QT  Repeat EKG: NSR, continued prolonged QT   Labs on Admission: I have personally reviewed the available labs and imaging studies at the time of the admission.  Pertinent labs:   hgb: 7.0 (10.9 two weeks ago),  INR: 1.4,  ferritin: 6, iron:  19,  TIBC: 459,  saturation: 4,   folate; 4.7,  B12: 215,  potassium: 3.1  Assessment and Plan: Principal Problem:   Symptomatic anemia Active Problems:   Menometrorrhagia   Prolonged QT interval   Hypokalemia   B12 deficiency   Folate deficiency   Paroxysmal atrial fibrillation (HCC)   Type 1 diabetes mellitus without complication (HCC)   HLD (hyperlipidemia)    Assessment and Plan: * Symptomatic anemia 42 year old presenting to ED with dizziness, fatigue, shortness of breath and pre syncopal event found to have acute anemia in setting of menometrorrhagia  -obs to progressive -1 unit PRBC ordered by ED -iron studies completed and show iron deficiency anemia with B12 and folate deficiency  -repeat cbc post transfusion 7.5, will transfuse another unit -trend cbc  -has had abnormal periods since starting eliquis, work up below -GyN consulted and starting Aygestin 10mg  daily while on eliquis  -she has had no more nose bleeds-start saline spray  -checking vw  factor  -start oral iron  -hold eliquis today   Menometrorrhagia Check TSH, von willebrand panel  and complete pelvic US GYN recommended Aygestin 10mg  daily to help suppress vaginal bleeding  [facility] will get her f/u at their office   Prolonged QT interval Optimize electrolytes Keep on telemetry Avoid qt prolonging drugs  Repeat ekg in AM    Hypokalemia On tele x 24 hours  Magnesium pending-low repleting and will trend  Repleted in ED Continue to trend  B12 deficiency Start IM replacement while in patient   Folate deficiency Start replacement  Paroxysmal atrial fibrillation (HCC) Has failed x 2 cardioversions and planned for ablation on 12/17, but cancelled by EP  Repeat EKG in NSR Continue lopressor and multaq Cha2ds2-vasc score of 2.  Hold eliquis x 1 day Hopefully after ablation can stop eliquis with menometrorrhagia.    Type 1 diabetes mellitus without complication (HCC) A1C 7.4 in March of 2024 repeat pending On Novolin N/R Moderate SSI and long acting insulin per weight based dosage   HLD (hyperlipidemia) She has a statin, but has not been taking Will start back when she gets home.       Advance Care Planning:   Code Status: Full Code   Consults: gyn   DVT Prophylaxis: SCDs  Family Communication: husband at bedside.  Severity of Illness: The appropriate patient status for this patient is OBSERVATION. Observation status is judged to be reasonable and necessary in order to provide the required intensity of service to ensure the patient's safety. The patient's presenting symptoms, physical exam findings, and initial radiographic and laboratory data in the context of their medical condition is felt to place them at decreased risk for further clinical deterioration. Furthermore, it is anticipated that the patient will be medically stable for discharge from the hospital within 2 midnights of admission.   Author: Orland Mustard,  MD 03/02/2023 12:24 PM  For on call review [email or web address].

## 2023-03-02 NOTE — Assessment & Plan Note (Addendum)
On tele x 24 hours  Magnesium pending-low repleting and will trend  Repleted in ED Continue to trend

## 2023-03-02 NOTE — Plan of Care (Signed)

## 2023-03-02 NOTE — Progress Notes (Signed)
CBG 303.  No HS coverage ordered.  Pt states she is DM type 1 and usually would take insulin at HS.  Dr. Antionette Char paged.

## 2023-03-02 NOTE — Assessment & Plan Note (Addendum)
42 year old presenting to ED with dizziness, fatigue, shortness of breath and pre syncopal event found to have acute anemia in setting of menometrorrhagia  -obs to progressive -1 unit PRBC ordered by ED -iron studies completed and show iron deficiency anemia with B12 and folate deficiency  -repeat cbc post transfusion 7.5, will transfuse another unit -trend cbc  -has had abnormal periods since starting eliquis, work up below -GyN consulted and starting Aygestin 10mg  daily while on eliquis  -she has had no more nose bleeds-start saline spray  -checking vw factor  -start oral iron  -hold eliquis today

## 2023-03-02 NOTE — ED Provider Triage Note (Signed)
Emergency Medicine Provider Triage Evaluation Note  Katie Hampton , a 42 y.o. female  was evaluated in triage.  Pt complains of lightheadedness with near syncope, persistent vaginal bleeding since November 11, intermittent nosebleeds over the past month.  Patient takes Eliquis due to persistent atrial fibrillation.  She has been scheduled for different times for abrasions but due to either complications or equipment failure has not been able to have the procedure yet.  She was most recently supposed to have an ablation yesterday when cardiology had an equipment failure..  Review of Systems  Positive:  Negative:   Physical Exam  BP (!) 162/102 (BP Location: Right Arm)   Pulse 84   Temp 98 F (36.7 C)   Resp 20   Ht 5\' 3"  (1.6 m)   Wt 101.2 kg   LMP 03/01/2023 (Exact Date)   SpO2 100%   BMI 39.52 kg/m  Gen:   Awake, no distress   Resp:  Normal effort  MSK:   Moves extremities without difficulty  Other:    Medical Decision Making  Medically screening exam initiated at 12:16 AM.  Appropriate orders placed.  Katie Hampton was informed that the remainder of the evaluation will be completed by another provider, this initial triage assessment does not replace that evaluation, and the importance of remaining in the ED until their evaluation is complete.     Darrick Grinder, PA-C 03/02/23 [phone]

## 2023-03-03 DIAGNOSIS — Z87891 Personal history of nicotine dependence: Secondary | ICD-10-CM | POA: Diagnosis not present

## 2023-03-03 DIAGNOSIS — E66812 Obesity, class 2: Secondary | ICD-10-CM | POA: Diagnosis present

## 2023-03-03 DIAGNOSIS — Z79899 Other long term (current) drug therapy: Secondary | ICD-10-CM | POA: Diagnosis not present

## 2023-03-03 DIAGNOSIS — Z7901 Long term (current) use of anticoagulants: Secondary | ICD-10-CM | POA: Diagnosis not present

## 2023-03-03 DIAGNOSIS — R55 Syncope and collapse: Secondary | ICD-10-CM | POA: Diagnosis present

## 2023-03-03 DIAGNOSIS — E876 Hypokalemia: Secondary | ICD-10-CM | POA: Diagnosis present

## 2023-03-03 DIAGNOSIS — Z794 Long term (current) use of insulin: Secondary | ICD-10-CM | POA: Diagnosis not present

## 2023-03-03 DIAGNOSIS — Z888 Allergy status to other drugs, medicaments and biological substances status: Secondary | ICD-10-CM | POA: Diagnosis not present

## 2023-03-03 DIAGNOSIS — E1042 Type 1 diabetes mellitus with diabetic polyneuropathy: Secondary | ICD-10-CM | POA: Diagnosis present

## 2023-03-03 DIAGNOSIS — D509 Iron deficiency anemia, unspecified: Secondary | ICD-10-CM | POA: Diagnosis present

## 2023-03-03 DIAGNOSIS — Z6839 Body mass index (BMI) 39.0-39.9, adult: Secondary | ICD-10-CM | POA: Diagnosis not present

## 2023-03-03 DIAGNOSIS — E785 Hyperlipidemia, unspecified: Secondary | ICD-10-CM | POA: Diagnosis present

## 2023-03-03 DIAGNOSIS — N921 Excessive and frequent menstruation with irregular cycle: Secondary | ICD-10-CM | POA: Diagnosis present

## 2023-03-03 DIAGNOSIS — D649 Anemia, unspecified: Secondary | ICD-10-CM | POA: Diagnosis present

## 2023-03-03 DIAGNOSIS — I4819 Other persistent atrial fibrillation: Secondary | ICD-10-CM | POA: Diagnosis present

## 2023-03-03 DIAGNOSIS — E538 Deficiency of other specified B group vitamins: Secondary | ICD-10-CM | POA: Diagnosis present

## 2023-03-03 LAB — CBC
HCT: 26.6 % — ABNORMAL LOW (ref 36.0–46.0)
Hemoglobin: 8.6 g/dL — ABNORMAL LOW (ref 12.0–15.0)
MCH: 28.9 pg (ref 26.0–34.0)
MCHC: 32.3 g/dL (ref 30.0–36.0)
MCV: 89.3 fL (ref 80.0–100.0)
Platelets: 240 10*3/uL (ref 150–400)
RBC: 2.98 MIL/uL — ABNORMAL LOW (ref 3.87–5.11)
RDW: 16.2 % — ABNORMAL HIGH (ref 11.5–15.5)
WBC: 8.1 10*3/uL (ref 4.0–10.5)
nRBC: 0 % (ref 0.0–0.2)

## 2023-03-03 LAB — GLUCOSE, CAPILLARY
Glucose-Capillary: 223 mg/dL — ABNORMAL HIGH (ref 70–99)
Glucose-Capillary: 321 mg/dL — ABNORMAL HIGH (ref 70–99)
Glucose-Capillary: 242 mg/dL — ABNORMAL HIGH (ref 70–99)

## 2023-03-03 LAB — BASIC METABOLIC PANEL
Anion gap: 7 (ref 5–15)
BUN: 6 mg/dL (ref 6–20)
CO2: 21 mmol/L — ABNORMAL LOW (ref 22–32)
Calcium: 7.5 mg/dL — ABNORMAL LOW (ref 8.9–10.3)
Chloride: 107 mmol/L (ref 98–111)
Creatinine, Ser: 0.82 mg/dL (ref 0.44–1.00)
GFR, Estimated: >60 mL/min (ref >60)
Glucose, Bld: 218 mg/dL — ABNORMAL HIGH (ref 70–99)
Potassium: 3.2 mmol/L — ABNORMAL LOW (ref 3.5–5.1)
Sodium: 135 mmol/L (ref 135–145)

## 2023-03-03 LAB — VON WILLEBRAND PANEL
Coagulation Factor VIII: 264 % — ABNORMAL HIGH (ref 56–140)
Ristocetin Co-factor, Plasma: 274 % — ABNORMAL HIGH (ref 50–200)
Von Willebrand Antigen, Plasma: 268 % — ABNORMAL HIGH (ref 50–200)

## 2023-03-03 LAB — TYPE AND SCREEN
ABO/RH(D): O POS
Antibody Screen: NEGATIVE
Unit division: 0
Unit division: 0

## 2023-03-03 LAB — BPAM RBC
Blood Product Expiration Date: 202412292359
Blood Product Expiration Date: 202412312359
ISSUE DATE / TIME: 202412150255
ISSUE DATE / TIME: 202412151431
Unit Type and Rh: 5100
Unit Type and Rh: 5100

## 2023-03-03 LAB — COAG STUDIES INTERP REPORT

## 2023-03-03 LAB — MAGNESIUM: Magnesium: 1.9 mg/dL (ref 1.7–2.4)

## 2023-03-03 MED ORDER — POTASSIUM CHLORIDE CRYS ER 20 MEQ PO TBCR
40.0000 meq | EXTENDED_RELEASE_TABLET | Freq: Four times a day (QID) | ORAL | Status: DC
  Administered 2023-03-03: 40 meq via ORAL
  Filled 2023-03-03: qty 2

## 2023-03-03 MED ORDER — FERROUS SULFATE ER 142 (45 FE) MG PO TBCR: 142.0000 mg | EXTENDED_RELEASE_TABLET | Freq: Every day | ORAL | 0 refills | Status: AC

## 2023-03-03 MED ORDER — NORETHINDRONE ACETATE 5 MG PO TABS: 10.0000 mg | ORAL_TABLET | Freq: Every day | ORAL | 0 refills | Status: DC

## 2023-03-03 MED ORDER — MULTI-VITAMIN/MINERALS PO TABS: 1.0000 | ORAL_TABLET | Freq: Every day | ORAL | 2 refills | Status: AC

## 2023-03-03 NOTE — TOC Transition Note (Signed)
Transition of Care [facility]) - Discharge Note   Patient Details  Name: Katie Hampton MRN: [id number] Date of Birth: January 07, 1981  [facility] CM/SW Contact:  Harriet Masson, RN Phone Number: 03/03/2023, 1:19 PM   Clinical Narrative:    Patient stable for discharge.  No TOC needs at this time.   Final next level of care: Home/Self Care Barriers to Discharge: Barriers Resolved   Patient Goals and CMS Choice Patient states their goals for this hospitalization and ongoing recovery are:: return home          Discharge Placement                 home      Discharge Plan and Services Additional resources added to the After Visit Summary for                                       Social Drivers of Health (SDOH) Interventions SDOH Screenings   Food Insecurity: No Food Insecurity (03/02/2023)  Housing: Low Risk  (03/02/2023)  Transportation Needs: No Transportation Needs (03/02/2023)  Utilities: Not At Risk (03/02/2023)  Depression (PHQ2-9): Low Risk  (06/13/2022)  Tobacco Use: Medium Risk (03/02/2023)     Readmission Risk Interventions    03/03/2023    1:18 PM  Readmission Risk Prevention Plan  Transportation Screening Complete  PCP or Specialist Appt within 5-7 Days Complete  Home Care Screening Complete  Medication Review (RN CM) Complete

## 2023-03-03 NOTE — Inpatient Diabetes Management (Signed)
Inpatient Diabetes Program Recommendations  AACE/ADA: New Consensus Statement on Inpatient Glycemic Control (2015)  Target Ranges:  Prepandial:   less than 140 mg/dL      Peak postprandial:   less than 180 mg/dL (1-2 hours)      Critically ill patients:  140 - 180 mg/dL   Lab Results  Component Value Date   GLUCAP 242 (H) 03/03/2023   HGBA1C 5.4 03/02/2023    Review of Glycemic Control  Latest Reference Range & Units 03/02/23 09:53 03/02/23 11:13 03/02/23 16:47 03/02/23 21:05 03/03/23 03:46 03/03/23 07:09 03/03/23 11:16  Glucose-Capillary 70 - 99 mg/dL 409 (H) 811 (H) 914 (H) 303 (H) 223 (H) 321 (H) 242 (H)   Diabetes history: DM 2 Outpatient Diabetes medications: NPH 20 units qam, 10-12 units qpm, Regular insulin 18-20 units breakfast 6-10 units supper Current orders for Inpatient glycemic control:  Semglee 5 units Daily Novolog 0-15 units tid + hs  A1c 5.4% on 12/15  Inpatient Diabetes Program Recommendations:    -   increase Semglee to 10 units  Thanks, Christena Deem RN, MSN, BC-ADM Inpatient Diabetes Coordinator Team Pager [phone] (8a-5p)

## 2023-03-03 NOTE — Progress Notes (Signed)
 Went over discharge paperwork with patient. All questions answered. PIV /telemetry removed. All belongings at bedside.

## 2023-03-03 NOTE — Discharge Summary (Signed)
Physician Discharge Summary  Katie Hampton MVH:[id number] DOB: Jul 19, 1980 DOA: 03/01/2023  PCP: [facility]  Admit date: 03/01/2023 Discharge date: 03/03/2023  Admitted From: Home Disposition:  Home  Recommendations for Outpatient Follow-up:  Follow up with PCP in 1-2 weeks Follow up with OBGYN as scheduled  Home Health:None  Equipment/Devices:None  Discharge Condition:Stable  CODE STATUS:Full  Diet recommendation:  As tolerated  Brief/Interim Summary: Katie Hampton is a 42 y.o. female with medical history significant of atrial fibrillation on eliquis, T1DM, HLD, peripheral neuropathy who presented to ED after near syncopal event found to have symptomatic anemia. Status post transfusion feeling much better - ObGyn consulted recommending norethindrone and ultrasound which was read as unremarkable for acute findings and follow up in the outpatient setting per their schedule.   Discharge Diagnoses:  Principal Problem:   Symptomatic anemia Active Problems:   Menometrorrhagia   Prolonged QT interval   Hypokalemia   B12 deficiency   Folate deficiency   Paroxysmal atrial fibrillation (HCC)   Type 1 diabetes mellitus without complication (HCC)   HLD (hyperlipidemia)    Discharge Instructions  Discharge Instructions     Call MD for:   Complete by: As directed    Worsening or ongoing prolonged bleeding   Call MD for:  difficulty breathing, headache or visual disturbances   Complete by: As directed    Call MD for:  extreme fatigue   Complete by: As directed    Call MD for:  persistant dizziness or light-headedness   Complete by: As directed    Call MD for:  persistant nausea and vomiting   Complete by: As directed    Call MD for:  severe uncontrolled pain   Complete by: As directed    Call MD for:  temperature >100.4   Complete by: As directed    Diet - low sodium heart healthy   Complete by: As directed    Discharge instructions   Complete by: As  directed    Continue iron and vitamin supplementation as discussed, follow-up with OB/GYN as scheduled for further evaluation and treatment.   Increase activity slowly   Complete by: As directed       Allergies as of 03/03/2023       Reactions   Flecainide Other (See Comments)   Prolonged QTC        Medication List     TAKE these medications    acetaminophen 500 MG tablet Commonly known as: TYLENOL Take 500-1,000 mg by mouth every 6 (six) hours as needed (pain.).   apixaban 5 MG Tabs tablet Commonly known as: Eliquis Take 1 tablet (5 mg total) by mouth 2 (two) times daily.   dronedarone 400 MG tablet Commonly known as: MULTAQ Take 1 tablet (400 mg total) by mouth 2 (two) times daily with a meal.   ferrous sulfate ER 142 (45 Fe) MG Tbcr tablet Commonly known as: Slow Fe Take 1 tablet (142 mg total) by mouth daily.   gabapentin 600 MG tablet Commonly known as: NEURONTIN Take 600 mg by mouth daily as needed (nerve pain).   insulin NPH Human 100 UNIT/ML injection Commonly known as: NOVOLIN N Inject 10-20 Units into the skin 2 (two) times daily before a meal. Inject 20 units subcuteanously in the morning, and inject 10-12 units subcutaneoulsy in the evening.   insulin regular 100 units/mL injection Commonly known as: NOVOLIN R Inject 6-20 Units into the skin 2 (two) times daily before a meal. Per patients sliding scale: breakfast 18-20  units, supper 6-10   metoprolol tartrate 50 MG tablet Commonly known as: LOPRESSOR Take 50 mg by mouth 2 (two) times daily.   multivitamin with minerals tablet Take 1 tablet by mouth daily.   norethindrone 5 MG tablet Commonly known as: AYGESTIN Take 2 tablets (10 mg total) by mouth daily. Start taking on: March 04, 2023   Pristiq 25 MG 24 hr tablet Generic drug: desvenlafaxine Take 25 mg by mouth every evening.   SUMAtriptan 100 MG tablet Commonly known as: IMITREX Take 100 mg by mouth every 2 (two) hours as needed for  migraine.   Vitamin D (Ergocalciferol) 1.25 MG (50000 UNIT) Caps capsule Commonly known as: DRISDOL Take 50,000 Units by mouth every Wednesday.        Follow-up Information     [facility] Follow up in 1 week(s).   Why: Hospital follow up Contact information: [address] [place] [address] [phone]                Allergies  Allergen Reactions   Flecainide Other (See Comments)    Prolonged QTC    Consultations: ObGyn   Procedures/Studies: US PELVIC COMPLETE WITH TRANSVAGINAL Result Date: 03/02/2023 CLINICAL DATA:  Vaginal bleeding. EXAM: TRANSABDOMINAL AND TRANSVAGINAL ULTRASOUND OF PELVIS TECHNIQUE: Both transabdominal and transvaginal ultrasound examinations of the pelvis were performed. Transabdominal technique was performed for global imaging of the pelvis including uterus, ovaries, adnexal regions, and pelvic cul-de-sac. It was necessary to proceed with endovaginal exam following the transabdominal exam to visualize the uterus and bilateral adnexae. COMPARISON:  CT abdomen and pelvis 10/21/2022 FINDINGS: Uterus Measurements: 8.4 x 4.5 x 5.5 cm = volume: 91 mL. No fibroids or other mass visualized. Endometrium Thickness: 8 mm, within normal limits. No focal abnormality visualized. Right ovary Measurements: 1.8 x 1.1 x 1.9 cm = volume: 2 mL. The sonographer noted a tubular structure with predominantly internal anechoic fluid adjacent to the right ovary, measuring up to approximately 2.0 cm in caliber. Differential considerations include a hydrosalpinx with mild complexity. This does not appear to have "dirty shadowing" from internal air to indicate a loop of bowel, however note is made that some loops of small bowel were seen adjacent to the right ovary on prior 06/21/2022 CT. Left ovary Measurements: 4.5 x 2.1 x 3.1 cm = volume: 11 mL. Normal appearance/no adnexal mass. Other findings No abnormal free fluid. IMPRESSION: 1. Normal appearance of the  uterus and endometrium. 2. Normal appearance of the left ovary. 3. Tubular structure with predominantly internal anechoic fluid but mild echogenic debris adjacent to the right ovary, measuring up to approximately 2.0 cm in caliber. 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 Consider follow-up ultrasound in 6 weeks to assess for interval change. Electronically Signed   By: Neita Garnet M.D.   On: 03/02/2023 13:59   DG Chest 2 View Result Date: 03/02/2023 CLINICAL DATA:  Chest pain, nausea, EXAM: CHEST - 2 VIEW COMPARISON:  None Available. FINDINGS: The heart size and mediastinal contours are within normal limits. Both lungs are clear. The visualized skeletal structures are unremarkable. IMPRESSION: No active cardiopulmonary disease. Electronically Signed   By: Helyn Numbers M.D.   On: 03/02/2023 00:33     Subjective: No acute issues/events   Discharge Exam: Vitals:   03/03/23 0758 03/03/23 1115  BP: 132/63 95/78  Pulse: 72 73  Resp: 16 15  Temp: 98.5 F (36.9 C) 98.2 F (36.8 C)  SpO2: 98%    Vitals:  03/03/23 0015 03/03/23 0343 03/03/23 0758 03/03/23 1115  BP: (!) 120/58 (!) 115/59 132/63 95/78  Pulse: 67 68 72 73  Resp: 17 19 16 15   Temp: 98 F (36.7 C) 98.2 F (36.8 C) 98.5 F (36.9 C) 98.2 F (36.8 C)  TempSrc: Oral Oral Oral Oral  SpO2: 99% 100% 98%   Weight:      Height:        General: Pt is alert, awake, not in acute distress Cardiovascular: RRR, S1/S2 +, no rubs, no gallops Respiratory: CTA bilaterally, no wheezing, no rhonchi Abdominal: Soft, NT, ND, bowel sounds + Extremities: no edema, no cyanosis    The results of significant diagnostics from this hospitalization (including imaging, microbiology, ancillary and laboratory) are listed below for reference.     Microbiology: Recent Results (from the past 240 hours)  MRSA Next Gen by PCR, Nasal     Status: None   Collection Time: 03/02/23  5:55 AM   Specimen: Nasal Mucosa; Nasal Swab  Result  Value Ref Range Status   MRSA by PCR Next Gen NOT DETECTED NOT DETECTED Final    Comment: (NOTE) The GeneXpert MRSA Assay (FDA approved for NASAL specimens only), is one component of a comprehensive MRSA colonization surveillance program. It is not intended to diagnose MRSA infection nor to guide or monitor treatment for MRSA infections. Test performance is not FDA approved in patients less than 55 years old. Performed at [facility], [address], [place], [place] [address]      Labs: BNP (last 3 results) No results for input(s): "BNP" in the last 8760 hours. Basic Metabolic Panel: Recent Labs  Lab 03/02/23 0038 03/02/23 0848 03/03/23 0213  NA 136 139 135  K 3.1* 3.7 3.2*  CL 105 97* 107  CO2 20* 20* 21*  GLUCOSE 100* 302* 218*  BUN 9 8 6   CREATININE 0.71 0.79 0.82  CALCIUM 7.9* 6.8* 7.5*  MG  --  1.2* 1.9   Liver Function Tests: Recent Labs  Lab 03/02/23 0038 03/02/23 0848  AST 47* 35  ALT 17 16  ALKPHOS 64 55  BILITOT 0.5 1.5*  PROT 6.4* 5.6*  ALBUMIN 2.8* 2.6*   No results for input(s): "LIPASE", "AMYLASE" in the last 168 hours. No results for input(s): "AMMONIA" in the last 168 hours. CBC: Recent Labs  Lab 03/02/23 0038 03/02/23 0848 03/02/23 1242 03/02/23 1940 03/03/23 0213  WBC 7.1 9.9  --   --  8.1  NEUTROABS  --  7.4  --   --   --   HGB 7.0* 7.5*  TO ADD CBCD SEE [id number] 7.5* 8.5* 8.6*  HCT 22.5* 23.1*  TO ADD CBCD SEE [id number] 22.9* 26.3* 26.6*  MCV 93.0 90.6  --   --  89.3  PLT 291 235  --   --  240   Cardiac Enzymes: No results for input(s): "CKTOTAL", "CKMB", "CKMBINDEX", "TROPONINI" in the last 168 hours. BNP: Invalid input(s): "POCBNP" CBG: Recent Labs  Lab 03/02/23 1647 03/02/23 2105 03/03/23 0346 03/03/23 0709 03/03/23 1116  GLUCAP 219* 303* 223* 321* 242*   D-Dimer No results for input(s): "DDIMER" in the last 72 hours. Hgb A1c Recent Labs    03/02/23 0850  HGBA1C 5.4   Lipid Profile No results for input(s):  "CHOL", "HDL", "LDLCALC", "TRIG", "CHOLHDL", "LDLDIRECT" in the last 72 hours. Thyroid function studies Recent Labs    03/02/23 0848  TSH 1.112   Anemia work up Recent Labs    03/02/23 0038  VITAMINB12 215  FOLATE 4.7*  FERRITIN 6*  TIBC 459*  IRON 19*  RETICCTPCT 4.7*   Urinalysis    Component Value Date/Time   COLORURINE AMBER (A) 10/12/2022 1019   APPEARANCEUR HAZY (A) 10/12/2022 1019   APPEARANCEUR Clear 07/23/2022 1101   LABSPEC 1.021 10/12/2022 1019   PHURINE 5.0 10/12/2022 1019   GLUCOSEU 50 (A) 10/12/2022 1019   HGBUR NEGATIVE 10/12/2022 1019   BILIRUBINUR NEGATIVE 10/12/2022 1019   BILIRUBINUR Negative 07/23/2022 1101   KETONESUR NEGATIVE 10/12/2022 1019   PROTEINUR 100 (A) 10/12/2022 1019   UROBILINOGEN 0.2 07/30/2015 1316   NITRITE NEGATIVE 10/12/2022 1019   LEUKOCYTESUR TRACE (A) 10/12/2022 1019   Sepsis Labs Recent Labs  Lab 03/02/23 0038 03/02/23 0848 03/03/23 0213  WBC 7.1 9.9 8.1   Microbiology Recent Results (from the past 240 hours)  MRSA Next Gen by PCR, Nasal     Status: None   Collection Time: 03/02/23  5:55 AM   Specimen: Nasal Mucosa; Nasal Swab  Result Value Ref Range Status   MRSA by PCR Next Gen NOT DETECTED NOT DETECTED Final    Comment: (NOTE) The GeneXpert MRSA Assay (FDA approved for NASAL specimens only), is one component of a comprehensive MRSA colonization surveillance program. It is not intended to diagnose MRSA infection nor to guide or monitor treatment for MRSA infections. Test performance is not FDA approved in patients less than 51 years old. Performed at [facility], [address], [place], [place] [address]      Time coordinating discharge: Over 30 minutes  SIGNED:   Azucena Fallen, DO [facility] 03/03/2023, 8:06 PM Pager   If 7PM-7AM, please contact night-coverage [email or web address]

## 2023-03-03 NOTE — Progress Notes (Signed)
CCMD called, patient back into [facility] at 2230.

## 2023-03-04 ENCOUNTER — Encounter (HOSPITAL_COMMUNITY): Admission: RE | Payer: Self-pay | Source: Home / Self Care

## 2023-03-04 ENCOUNTER — Ambulatory Visit (HOSPITAL_COMMUNITY): Admission: RE | Admit: 2023-03-04 | Payer: Medicare Other | Source: Home / Self Care | Admitting: Cardiology

## 2023-03-04 SURGERY — ATRIAL FIBRILLATION ABLATION
Anesthesia: General

## 2023-03-07 ENCOUNTER — Other Ambulatory Visit: Payer: Self-pay | Admitting: Cardiology

## 2023-03-11 ENCOUNTER — Ambulatory Visit: Payer: Medicare Other | Admitting: Cardiology

## 2023-03-14 ENCOUNTER — Other Ambulatory Visit: Payer: Self-pay

## 2023-03-14 ENCOUNTER — Encounter (HOSPITAL_COMMUNITY): Payer: Self-pay

## 2023-03-14 ENCOUNTER — Emergency Department (HOSPITAL_COMMUNITY): Payer: Medicare Other

## 2023-03-14 ENCOUNTER — Emergency Department (HOSPITAL_COMMUNITY)
Admission: EM | Admit: 2023-03-14 | Discharge: 2023-03-14 | Disposition: A | Discharge status: Home / Self Care | Payer: Medicare Other | Attending: Emergency Medicine | Admitting: Emergency Medicine

## 2023-03-14 DIAGNOSIS — R002 Palpitations: Secondary | ICD-10-CM | POA: Insufficient documentation

## 2023-03-14 DIAGNOSIS — R251 Tremor, unspecified: Secondary | ICD-10-CM | POA: Diagnosis present

## 2023-03-14 DIAGNOSIS — E109 Type 1 diabetes mellitus without complications: Secondary | ICD-10-CM | POA: Insufficient documentation

## 2023-03-14 DIAGNOSIS — R69 Illness, unspecified: Secondary | ICD-10-CM

## 2023-03-14 DIAGNOSIS — R55 Syncope and collapse: Secondary | ICD-10-CM | POA: Insufficient documentation

## 2023-03-14 DIAGNOSIS — R531 Weakness: Secondary | ICD-10-CM

## 2023-03-14 DIAGNOSIS — Z7901 Long term (current) use of anticoagulants: Secondary | ICD-10-CM | POA: Insufficient documentation

## 2023-03-14 DIAGNOSIS — Z794 Long term (current) use of insulin: Secondary | ICD-10-CM | POA: Diagnosis not present

## 2023-03-14 LAB — BASIC METABOLIC PANEL
Anion gap: 14 (ref 5–15)
BUN: 5 mg/dL — ABNORMAL LOW (ref 6–20)
CO2: 18 mmol/L — ABNORMAL LOW (ref 22–32)
Calcium: 8 mg/dL — ABNORMAL LOW (ref 8.9–10.3)
Chloride: 102 mmol/L (ref 98–111)
Creatinine, Ser: 0.82 mg/dL (ref 0.44–1.00)
GFR, Estimated: >60 mL/min (ref >60)
Glucose, Bld: 267 mg/dL — ABNORMAL HIGH (ref 70–99)
Potassium: 3.8 mmol/L (ref 3.5–5.1)
Sodium: 134 mmol/L — ABNORMAL LOW (ref 135–145)

## 2023-03-14 LAB — CBC WITH DIFFERENTIAL/PLATELET
Abs Immature Granulocytes: 0.02 10*3/uL (ref 0.00–0.07)
Basophils Absolute: 0.1 10*3/uL (ref 0.0–0.1)
Basophils Relative: 1 %
Eosinophils Absolute: 0 10*3/uL (ref 0.0–0.5)
Eosinophils Relative: 1 %
HCT: 34.2 % — ABNORMAL LOW (ref 36.0–46.0)
Hemoglobin: 10.9 g/dL — ABNORMAL LOW (ref 12.0–15.0)
Immature Granulocytes: 0 %
Lymphocytes Relative: 28 %
Lymphs Abs: 1.6 10*3/uL (ref 0.7–4.0)
MCH: 28 pg (ref 26.0–34.0)
MCHC: 31.9 g/dL (ref 30.0–36.0)
MCV: 87.9 fL (ref 80.0–100.0)
Monocytes Absolute: 0.3 10*3/uL (ref 0.1–1.0)
Monocytes Relative: 6 %
Neutro Abs: 3.5 10*3/uL (ref 1.7–7.7)
Neutrophils Relative %: 64 %
Platelets: 329 10*3/uL (ref 150–400)
RBC: 3.89 MIL/uL (ref 3.87–5.11)
RDW: 14.7 % (ref 11.5–15.5)
WBC: 5.5 10*3/uL (ref 4.0–10.5)
nRBC: 0 % (ref 0.0–0.2)

## 2023-03-14 LAB — PHOSPHORUS: Phosphorus: 5.1 mg/dL — ABNORMAL HIGH (ref 2.5–4.6)

## 2023-03-14 LAB — MAGNESIUM: Magnesium: 1.3 mg/dL — ABNORMAL LOW (ref 1.7–2.4)

## 2023-03-14 LAB — TROPONIN I (HIGH SENSITIVITY)
Troponin I (High Sensitivity): 4 ng/L (ref <18)
Troponin I (High Sensitivity): <2 ng/L (ref <18)

## 2023-03-14 MED ORDER — MAGNESIUM SULFATE 2 GM/50ML IV SOLN
2.0000 g | Freq: Once | INTRAVENOUS | Status: AC
  Administered 2023-03-14: 2 g via INTRAVENOUS
  Filled 2023-03-14: qty 50

## 2023-03-14 MED ORDER — MAGNESIUM GLUCONATE 500 (27 MG) MG PO TABS: 500.0000 mg | ORAL_TABLET | Freq: Every day | ORAL | 0 refills | Status: AC

## 2023-03-14 NOTE — ED Provider Notes (Signed)
[facility] EMERGENC[facility] DEPARTMENT AT [facility] Provider Note   CSN: [id number] Arrival date & time: 03/14/23  0551     History  Chief Complaint  Patient presents with   Shortness of Breath    Katie Hampton is a 42 y.o. female.  HPI Patient with past medical history of atrial fibrillation on Eliquis, type 1 diabetes, admitted with symptomatic anemia and hypokalemia. Patient reports that she has been feeling all right since her discharge from the hospital.  She reports she does have multiple medical problems and has not had a great year.  The symptoms that brought her in this morning however was awakening around 3 AM with a feeling of heart racing, extreme jitteriness and tremulousness, feeling diaphoretic and lightheaded.  She reports that the symptoms persisted through much of the morning.  She reports she felt like she might pass out whether she was standing or lying in bed.  She did not have loss of consciousness or fall or injury.  Patient denies she had chest pain, headache.  Patient is compliant with her Eliquis, Multaq and metoprolol.  She takes desvenlafaxine.  Was previously admitted with anemia with very heavy menstrual bleeding.  She denies has been having menstrual bleeding since starting the Aygestin.    Home Medications Prior to Admission medications   Medication Sig Start Date End Date Taking? Authorizing Provider  Magnesium Gluconate 500 (27 Mg) MG TABS Take 1 tablet (500 mg total) by mouth daily. 03/14/23  Yes Arby Barrette, MD  acetaminophen (TYLENOL) 500 MG tablet Take 500-1,000 mg by mouth every 6 (six) hours as needed (pain.).    [provider]  apixaban (ELIQUIS) 5 MG TABS tablet Take 1 tablet (5 mg total) by mouth 2 (two) times daily. 01/06/23   Sherie Don, NP  desvenlafaxine (PRISTIQ) 25 MG 24 hr tablet Take 25 mg by mouth every evening.    [provider]  ferrous sulfate ER (SLOW FE) 142 (45 Fe) MG TBCR tablet Take 1  tablet (142 mg total) by mouth daily. 03/03/23   Azucena Fallen, MD  gabapentin (NEURONTIN) 600 MG tablet Take 600 mg by mouth daily as needed (nerve pain). 01/30/21   [provider]  insulin NPH (HUMULIN N,NOVOLIN N) 100 UNIT/ML injection Inject 10-20 Units into the skin 2 (two) times daily before a meal. Inject 20 units subcuteanously in the morning, and inject 10-12 units subcutaneoulsy in the evening.    [provider]  insulin regular (NOVOLIN R,HUMULIN R) 100 units/mL injection Inject 6-20 Units into the skin 2 (two) times daily before a meal. Per patients sliding scale: breakfast 18-20 units, supper 6-10    [provider]  metoprolol tartrate (LOPRESSOR) 50 MG tablet Take 50 mg by mouth 2 (two) times daily.    [provider]  MULTAQ 400 MG tablet TAKE 1 TABLET (400 MG TOTAL) BY MOUTH 2 (TWO) TIMES DAILY WITH A MEAL. 03/10/23   Sherie Don, NP  Multiple Vitamins-Minerals (MULTIVITAMIN WITH MINERALS) tablet Take 1 tablet by mouth daily. 03/03/23 03/02/24  Azucena Fallen, MD  norethindrone (AYGESTIN) 5 MG tablet Take 2 tablets (10 mg total) by mouth daily. 03/04/23   Azucena Fallen, MD  SUMAtriptan (IMITREX) 100 MG tablet Take 100 mg by mouth every 2 (two) hours as needed for migraine. 05/03/22   [provider]  Vitamin D, Ergocalciferol, (DRISDOL) 1.25 MG (50000 UNIT) CAPS capsule Take 50,000 Units by mouth every Wednesday. 05/03/22   [provider]  Allergies    Flecainide    Review of Systems   Review of Systems  Physical Exam Updated Vital Signs BP 117/62 (BP Location: Right Arm)   Pulse 78   Temp 98.6 F (37 C) (Oral)   Resp 16   Ht 5\' 3"  (1.6 m)   LMP 03/01/2023 (Exact Date)   SpO2 100%   BMI 39.52 kg/m  Physical Exam Constitutional:      Comments: Alert nontoxic.  Mental status clear.  No respiratory distress.  HENT:     Head: Normocephalic and atraumatic.     Mouth/Throat:     Mouth:  Mucous membranes are moist.     Pharynx: Oropharynx is clear.  Eyes:     Comments: Disconjugate gaze.  Baseline.  Cardiovascular:     Rate and Rhythm: Normal rate. Rhythm irregular.  Pulmonary:     Effort: Pulmonary effort is normal.     Breath sounds: Normal breath sounds.  Abdominal:     General: There is no distension.     Palpations: Abdomen is soft.     Tenderness: There is no abdominal tenderness. There is no guarding.  Musculoskeletal:        General: No swelling or tenderness. Normal range of motion.     Cervical back: Neck supple.     Right lower leg: No edema.     Left lower leg: No edema.  Skin:    General: Skin is warm and dry.  Neurological:     General: No focal deficit present.     Mental Status: She is oriented to person, place, and time.     Cranial Nerves: No cranial nerve deficit.     Sensory: No sensory deficit.     Motor: No weakness.     Coordination: Coordination normal.  Psychiatric:        Mood and Affect: Mood normal.     ED Results / Procedures / Treatments   Labs (all labs ordered are listed, but only abnormal results are displayed) Labs Reviewed  BASIC METABOLIC PANEL - Abnormal; Notable for the following components:      Result Value   Sodium 134 (*)    CO2 18 (*)    Glucose, Bld 267 (*)    BUN 5 (*)    Calcium 8.0 (*)    All other components within normal limits  CBC WITH DIFFERENTIAL/PLATELET - Abnormal; Notable for the following components:   Hemoglobin 10.9 (*)    HCT 34.2 (*)    All other components within normal limits  MAGNESIUM - Abnormal; Notable for the following components:   Magnesium 1.3 (*)    All other components within normal limits  PHOSPHORUS - Abnormal; Notable for the following components:   Phosphorus 5.1 (*)    All other components within normal limits  TROPONIN I (HIGH SENSITIVITY)  TROPONIN I (HIGH SENSITIVITY)    EKG EKG Interpretation Date/Time:  Friday March 14 2023 05:54:59 EST Ventricular Rate:   83 PR Interval:  104 QRS Duration:  82 QT Interval:  408 QTC Calculation: 479 R Axis:   27  Text Interpretation: a fib Low voltage QRS Nonspecific ST and T wave abnormality Abnormal ECG When compared with ECG of 14-Mar-2023 05:54, PREVIOUS ECG IS PRESENT Confirmed by Arby Barrette [phone]) on 03/14/2023 7:05:28 AM  Radiology DG Chest 2 View Result Date: 03/14/2023 CLINICAL DATA:  42 year old female with shortness of breath, dizziness, presyncope. EXAM: CHEST - 2 VIEW COMPARISON:  Chest radiographs 03/02/2023 and earlier. FINDINGS:  AP and lateral views at 0620 hours. Lower lung volumes on the AP view, more normal on the lateral. Normal cardiac size and mediastinal contours. Visualized tracheal air column is within normal limits. No pneumothorax, pulmonary edema, pleural effusion or confluent lung opacity. No acute osseous abnormality identified. Stable cholecystectomy clips. Paucity of bowel gas. IMPRESSION: No cardiopulmonary abnormality. Electronically Signed   By: Odessa Fleming M.D.   On: 03/14/2023 06:50    Procedures Procedures    Medications Ordered in ED Medications  magnesium sulfate IVPB 2 g 50 mL (0 g Intravenous Stopped 03/14/23 1137)    ED Course/ Medical Decision Making/ A&P                                 Medical Decision Making Amount and/or Complexity of Data Reviewed Labs: ordered. Radiology: ordered.  Risk OTC drugs. Prescription drug management.   Patient presents as outlined.  She had a recent hospitalization with anemia, hypokalemia in the setting of atrial fibrillation and anticoagulation.  Since discharge, patient reports has been feeling all right but tonight had an episode as outlined.  Differential diagnosis includes electrolyte derangement, recurrent symptomatic anemia, uncontrolled atrial fibrillation, ACS, medication interaction.  Will proceed with lab work and EKG.  EKG reviewed by myself controlled atrial fibrillation without excelled rate and no  significant change from previous.  No acute ischemic appearance.  Troponin 4 and less than 2.  Phosphorus 5.1, magnesium 1.3, sodium 134 potassium 3.8 glucose 267 GFR greater than 60 anion gap 14.  White count 5.5 hemoglobin 10.9 hematocrit 34.2  2 view chest reviewed by radiology no acute abnormality.  Patient does take antiarrhythmic medication, Multaq.  With low magnesium, possible exacerbation of palpitation and irregular atrial fibrillation.  Will replace magnesium IV.  Potassium is within range with the lower end of normal.  I have reviewed this with the patient.  She takes potassium sporadically.  She will continue potassium on every other day basis.  I will prescribe magnesium gluconate.  Have also advised the patient to carefully review dietary restrictions and specific instructions for the Multaq.  She will follow-up both with her endocrinologist to discuss any possibility of Aygestin resulting in palpitations and tremulousness.  We reviewed careful return precautions.  Patient is discharged in good condition alert and appropriate feeling improved.       Final Clinical Impression(s) / ED Diagnoses Final diagnoses:  Palpitation  Tremulousness  General weakness  Near syncope  Hypomagnesemia  Severe comorbid illness    Rx / DC Orders ED Discharge Orders          Ordered    Magnesium Gluconate 500 (27 Mg) MG TABS  Daily        03/14/23 1242              Arby Barrette, MD 03/14/23 1303

## 2023-03-14 NOTE — ED Triage Notes (Signed)
Pt complaining of shortness of breath and a-fib for the last 2-3 hours. Dizzy and feels like she is going to pass out.

## 2023-03-14 NOTE — Discharge Instructions (Signed)
1.  Start your daily magnesium supplement.  Follow-up with your doctor within 3 to 5 days for recheck of your magnesium level. 2.  Discuss the birth control you are using to regulate your menstrual cycles with your endocrinologist.  You may be experiencing side effects of tremulousness and palpitations with this medication in association with your already underlying atrial fibrillation and other rate control medications. 3.  Stay well-hydrated. 4.  Follow-up with your cardiologist for recheck in the next 1 to 2 weeks. 5.  Return to the emergency department immediately if you have concerning or worsening symptoms.

## 2023-03-17 ENCOUNTER — Other Ambulatory Visit: Payer: Self-pay

## 2023-03-24 ENCOUNTER — Encounter (HOSPITAL_COMMUNITY): Payer: Self-pay

## 2023-03-24 ENCOUNTER — Other Ambulatory Visit: Payer: Self-pay

## 2023-03-24 ENCOUNTER — Emergency Department (HOSPITAL_COMMUNITY)
Admission: EM | Admit: 2023-03-24 | Discharge: 2023-03-24 | Discharge status: Against Medical Advice | Payer: Medicare Other | Attending: Emergency Medicine | Admitting: Emergency Medicine

## 2023-03-24 ENCOUNTER — Emergency Department (HOSPITAL_COMMUNITY): Payer: Medicare Other

## 2023-03-24 DIAGNOSIS — R0789 Other chest pain: Secondary | ICD-10-CM | POA: Insufficient documentation

## 2023-03-24 DIAGNOSIS — Z5321 Procedure and treatment not carried out due to patient leaving prior to being seen by health care provider: Secondary | ICD-10-CM | POA: Diagnosis not present

## 2023-03-24 DIAGNOSIS — R42 Dizziness and giddiness: Secondary | ICD-10-CM | POA: Diagnosis not present

## 2023-03-24 DIAGNOSIS — N939 Abnormal uterine and vaginal bleeding, unspecified: Secondary | ICD-10-CM | POA: Diagnosis not present

## 2023-03-24 DIAGNOSIS — R0602 Shortness of breath: Secondary | ICD-10-CM | POA: Diagnosis not present

## 2023-03-24 LAB — BASIC METABOLIC PANEL
Anion gap: 13 (ref 5–15)
BUN: 8 mg/dL (ref 6–20)
CO2: 18 mmol/L — ABNORMAL LOW (ref 22–32)
Calcium: 8.9 mg/dL (ref 8.9–10.3)
Chloride: 103 mmol/L (ref 98–111)
Creatinine, Ser: 0.91 mg/dL (ref 0.44–1.00)
GFR, Estimated: >60 mL/min (ref >60)
Glucose, Bld: 198 mg/dL — ABNORMAL HIGH (ref 70–99)
Potassium: 4.4 mmol/L (ref 3.5–5.1)
Sodium: 134 mmol/L — ABNORMAL LOW (ref 135–145)

## 2023-03-24 LAB — CBC
HCT: 41.6 % (ref 36.0–46.0)
Hemoglobin: 12.6 g/dL (ref 12.0–15.0)
MCH: 29 pg (ref 26.0–34.0)
MCHC: 30.3 g/dL (ref 30.0–36.0)
MCV: 95.9 fL (ref 80.0–100.0)
Platelets: 323 10*3/uL (ref 150–400)
RBC: 4.34 MIL/uL (ref 3.87–5.11)
RDW: 17.8 % — ABNORMAL HIGH (ref 11.5–15.5)
WBC: 14.6 10*3/uL — ABNORMAL HIGH (ref 4.0–10.5)
nRBC: 0 % (ref 0.0–0.2)

## 2023-03-24 LAB — TROPONIN I (HIGH SENSITIVITY): Troponin I (High Sensitivity): 3 ng/L (ref <18)

## 2023-03-24 LAB — HCG, SERUM, QUALITATIVE: Preg, Serum: NEGATIVE

## 2023-03-24 NOTE — ED Notes (Signed)
 Patient left.

## 2023-03-24 NOTE — ED Triage Notes (Addendum)
 Pt came in via POV d/t for a while now intermittently feeling dizzy, chest tightness, SOB, Jittery & was told a few weeks ago she was anemic & was placed on birth control pills. Endorses vaginal bleeding & passing large clots today. A/Ox4, denies pain during triage.

## 2023-03-28 ENCOUNTER — Ambulatory Visit: Payer: Medicare Other | Admitting: Cardiology

## 2023-04-08 ENCOUNTER — Ambulatory Visit: Payer: Medicare Other | Admitting: Cardiology

## 2023-04-10 NOTE — Pre-Procedure Instructions (Signed)
Instructed patient on the following items: Arrival time 1000 Nothing to eat or drink after midnight No meds AM of procedure Responsible person to drive you home and stay with you for 24 hrs  Have you missed any doses of anti-coagulant Eliquis- takes twice a day, hasn't missed any doses.

## 2023-04-11 ENCOUNTER — Encounter (HOSPITAL_COMMUNITY): Admission: RE | Payer: Medicare Other | Source: Home / Self Care

## 2023-04-11 ENCOUNTER — Ambulatory Visit (HOSPITAL_COMMUNITY): Admission: RE | Admit: 2023-04-11 | Payer: Medicare Other | Source: Home / Self Care | Admitting: Cardiology

## 2023-04-11 SURGERY — ATRIAL FIBRILLATION ABLATION
Anesthesia: General

## 2023-04-13 ENCOUNTER — Encounter: Payer: Self-pay | Admitting: Emergency Medicine

## 2023-04-13 ENCOUNTER — Other Ambulatory Visit: Payer: Self-pay

## 2023-04-13 ENCOUNTER — Emergency Department
Admission: EM | Admit: 2023-04-13 | Discharge: 2023-04-13 | Disposition: A | Discharge status: Home / Self Care | Payer: Medicare Other | Attending: Emergency Medicine | Admitting: Emergency Medicine

## 2023-04-13 DIAGNOSIS — Z7901 Long term (current) use of anticoagulants: Secondary | ICD-10-CM | POA: Insufficient documentation

## 2023-04-13 DIAGNOSIS — E1065 Type 1 diabetes mellitus with hyperglycemia: Secondary | ICD-10-CM | POA: Diagnosis not present

## 2023-04-13 DIAGNOSIS — R42 Dizziness and giddiness: Secondary | ICD-10-CM | POA: Diagnosis present

## 2023-04-13 DIAGNOSIS — R110 Nausea: Secondary | ICD-10-CM | POA: Insufficient documentation

## 2023-04-13 LAB — BASIC METABOLIC PANEL
Anion gap: 14 (ref 5–15)
BUN: 9 mg/dL (ref 6–20)
CO2: 22 mmol/L (ref 22–32)
Calcium: 8 mg/dL — ABNORMAL LOW (ref 8.9–10.3)
Chloride: 96 mmol/L — ABNORMAL LOW (ref 98–111)
Creatinine, Ser: 0.9 mg/dL (ref 0.44–1.00)
GFR, Estimated: >60 mL/min (ref >60)
Glucose, Bld: 317 mg/dL — ABNORMAL HIGH (ref 70–99)
Potassium: 4.1 mmol/L (ref 3.5–5.1)
Sodium: 132 mmol/L — ABNORMAL LOW (ref 135–145)

## 2023-04-13 LAB — CBC
HCT: 33.4 % — ABNORMAL LOW (ref 36.0–46.0)
Hemoglobin: 11.3 g/dL — ABNORMAL LOW (ref 12.0–15.0)
MCH: 32.1 pg (ref 26.0–34.0)
MCHC: 33.8 g/dL (ref 30.0–36.0)
MCV: 94.9 fL (ref 80.0–100.0)
Platelets: 227 10*3/uL (ref 150–400)
RBC: 3.52 MIL/uL — ABNORMAL LOW (ref 3.87–5.11)
RDW: 20.1 % — ABNORMAL HIGH (ref 11.5–15.5)
WBC: 7.6 10*3/uL (ref 4.0–10.5)
nRBC: 0 % (ref 0.0–0.2)

## 2023-04-13 LAB — TROPONIN I (HIGH SENSITIVITY): Troponin I (High Sensitivity): 2 ng/L (ref <18)

## 2023-04-13 LAB — MAGNESIUM: Magnesium: 1.3 mg/dL — ABNORMAL LOW (ref 1.7–2.4)

## 2023-04-13 MED ORDER — ONDANSETRON 4 MG PO TBDP
4.0000 mg | ORAL_TABLET | Freq: Once | ORAL | Status: AC
  Administered 2023-04-13: 4 mg via ORAL
  Filled 2023-04-13: qty 1

## 2023-04-13 MED ORDER — ONDANSETRON HCL 4 MG PO TABS: 4.0000 mg | ORAL_TABLET | Freq: Four times a day (QID) | ORAL | 0 refills | Status: AC | PRN

## 2023-04-13 MED ORDER — MAGNESIUM OXIDE -MG SUPPLEMENT 400 (240 MG) MG PO TABS
400.0000 mg | ORAL_TABLET | Freq: Once | ORAL | Status: AC
  Administered 2023-04-13: 400 mg via ORAL
  Filled 2023-04-13: qty 1

## 2023-04-13 MED ORDER — NORETHINDRONE ACETATE 5 MG PO TABS: 10.0000 mg | ORAL_TABLET | Freq: Every day | ORAL | 1 refills | Status: AC

## 2023-04-13 NOTE — ED Notes (Signed)
Blood draw x2 attempts unsuccessful, lab called and will send someone.

## 2023-04-13 NOTE — ED Provider Notes (Signed)
[facility] Provider Note    Event Date/Time   First MD Initiated Contact with Patient 04/13/23 1058     (approximate)   History   Dizziness and Weakness   HPI  Katie Hampton is a 43 year old female with history of A-fib on Eliquis, T1DM presenting to the emergency department for evaluation of lightheadedness.  Patient reports intermittent symptoms for several weeks.  Was admitted at [facility] last month for symptomatic anemia and hypokalemia.  Was placed on birth control with improvement in her bleeding, but reports that she ran out of this earlier this week and has had some recurrent vaginal bleeding.  Her primary care doctor was uncomfortable refilling her norethindrone. She is awaiting outpatient follow-up with OB/GYN.  No syncope but reports feeling jittery.  Has had some decreased p.o. intake with decreased appetite and occasional nausea.  No vomiting or diarrhea.      Physical Exam   Triage Vital Signs: ED Triage Vitals  Encounter Vitals Group     BP 04/13/23 0929 111/74     Systolic BP Percentile --      Diastolic BP Percentile --      Pulse Rate 04/13/23 0929 86     Resp 04/13/23 0929 18     Temp 04/13/23 0929 97.6 F (36.4 C)     Temp Source 04/13/23 0929 Oral     SpO2 04/13/23 0929 99 %     Weight 04/13/23 0921 215 lb (97.5 kg)     Height 04/13/23 0921 5\' 3"  (1.6 m)     Head Circumference --      Peak Flow --      Pain Score 04/13/23 0921 0     Pain Loc --      Pain Education --      Exclude from Growth Chart --     Most recent vital signs: Vitals:   04/13/23 0929  BP: 111/74  Pulse: 86  Resp: 18  Temp: 97.6 F (36.4 C)  SpO2: 99%     General: Awake, interactive  CV:  Regular rate, good peripheral perfusion.  Resp:  Unlabored respirations, lungs clear to auscultation Abd:  Nondistended, soft, nontender to palpation Neuro:  Alert and oriented, normal extraocular movements, symmetric facial movement, sensation intact  over bilateral upper and lower extremities with 5 out of 5 strength.  Normal finger-to-nose testing.   ED Results / Procedures / Treatments   Labs (all labs ordered are listed, but only abnormal results are displayed) Labs Reviewed  BASIC METABOLIC PANEL - Abnormal; Notable for the following components:      Result Value   Sodium 132 (*)    Chloride 96 (*)    Glucose, Bld 317 (*)    Calcium 8.0 (*)    All other components within normal limits  CBC - Abnormal; Notable for the following components:   RBC 3.52 (*)    Hemoglobin 11.3 (*)    HCT 33.4 (*)    RDW 20.1 (*)    All other components within normal limits  MAGNESIUM - Abnormal; Notable for the following components:   Magnesium 1.3 (*)    All other components within normal limits  TROPONIN I (HIGH SENSITIVITY)     EKG EKG independently reviewed interpreted by myself (ER attending) demonstrates:  EKG demonstrates sinus rhythm at a rate of 75, PR 178, QRS 84, QTc 473, no acute ST changes  RADIOLOGY Imaging independently reviewed and interpreted by myself demonstrates:    PROCEDURES:  Critical Care performed: No  Procedures   MEDICATIONS ORDERED IN ED: Medications  magnesium oxide (MAG-OX) tablet 400 mg (has no administration in time range)  ondansetron (ZOFRAN-ODT) disintegrating tablet 4 mg (4 mg Oral Given 04/13/23 1134)     IMPRESSION / MDM / ASSESSMENT AND PLAN / ED COURSE  I reviewed the triage vital signs and the nursing notes.  Differential diagnosis includes, but is not limited to, dehydration, anemia, arrhythmia, electrolyte abnormality  Patient's presentation is most consistent with acute presentation with potential threat to life or bodily function.  43 year old female presenting with lightheadedness.  Vital stable on presentation.  Labs with mild anemia with hemoglobin of 11.3, slightly down from ER visit last month, but much improved from recent admission.  Negative troponin.  EKG without acute  ischemic findings.  BMP does have hyperglycemia, but without evidence of DKA.  Potassium reassuring at 4.1.  Will add on magnesium as she has had recent hypomagnesemia.  I did discuss IV placement with fluids, but patient would prefer to avoid IV stick.  As she is tolerating p.o., will plan for Zofran and p.o. fluid resuscitation.  Patient did report improved appetite after Zofran.  Tolerated p.o. without issue.  Magnesium level did return low at 1.3, reports that she ran out of her home magnesium glycinate recently.  Will provide a dose of magnesium oxide here.  Will also DC with short course of Zofran as well as refill of her norethindrone until she is able to follow-up with OB/GYN.  Strict return precautions provided.  Patient discharged stable condition.     FINAL CLINICAL IMPRESSION(S) / ED DIAGNOSES   Final diagnoses:  Lightheadedness  Nausea  Hypomagnesemia     Rx / DC Orders   ED Discharge Orders          Ordered    norethindrone (AYGESTIN) 5 MG tablet  Daily        04/13/23 1238    ondansetron (ZOFRAN) 4 MG tablet  Every 6 hours PRN        04/13/23 1238             Note:  This document was prepared using Dragon voice recognition software and may include unintentional dictation errors.   Trinna Post, MD 04/13/23 1239

## 2023-04-13 NOTE — Discharge Instructions (Signed)
You were seen at ER for your lightheadedness.  Your testing was fortunately overall reassuring.  I suspect this may partly be related to your increased vaginal bleeding.  I have sent a refill of your norethindrone to your pharmacy as well as some nausea medication to help with your appetite.  Please also continue to take magnesium as directed.  Please schedule follow-up with OB/GYN and your primary care doctor.  Return to the ER for any new or worsening symptoms.

## 2023-04-13 NOTE — ED Triage Notes (Addendum)
Pt arrived via POV with reports of dizziness that is constant, pt states the dizziness got worse over night and feels like her legs are wobbly.  Pt seen at [facility] a few weeks ago.  Pt reports anemia recently. Pt is on eliquis for afib.

## 2023-04-25 ENCOUNTER — Ambulatory Visit: Payer: Medicare Other | Attending: Cardiology | Admitting: Cardiology

## 2023-05-08 ENCOUNTER — Other Ambulatory Visit: Payer: Self-pay | Admitting: Cardiology

## 2023-05-09 ENCOUNTER — Ambulatory Visit: Payer: Medicare Other | Admitting: Cardiology

## 2023-05-14 ENCOUNTER — Ambulatory Visit: Payer: Medicare Other | Admitting: Cardiology

## 2023-05-29 ENCOUNTER — Ambulatory Visit: Payer: Medicare Other | Admitting: Cardiology

## 2023-06-05 ENCOUNTER — Other Ambulatory Visit: Payer: Self-pay | Admitting: Emergency Medicine

## 2023-06-05 MED ORDER — APIXABAN 5 MG PO TABS: 5.0000 mg | ORAL_TABLET | Freq: Two times a day (BID) | ORAL | Status: DC

## 2023-06-05 MED ORDER — APIXABAN 5 MG PO TABS: 5.0000 mg | ORAL_TABLET | Freq: Two times a day (BID) | ORAL | 0 refills | Status: DC

## 2023-06-05 NOTE — Addendum Note (Signed)
 Addended by: Frutoso Schatz on: 06/05/2023 12:21 PM   Modules accepted: Orders

## 2023-06-30 ENCOUNTER — Ambulatory Visit: Payer: Medicare Other | Admitting: Cardiology

## 2023-07-01 ENCOUNTER — Other Ambulatory Visit: Payer: Self-pay

## 2023-07-01 MED ORDER — MULTAQ 400 MG PO TABS: 400.0000 mg | ORAL_TABLET | Freq: Two times a day (BID) | ORAL | 0 refills | Status: DC

## 2023-07-16 ENCOUNTER — Other Ambulatory Visit: Payer: Self-pay

## 2023-07-16 ENCOUNTER — Ambulatory Visit: Attending: Cardiology | Admitting: Cardiology

## 2023-07-16 ENCOUNTER — Encounter: Payer: Self-pay | Admitting: Cardiology

## 2023-07-16 VITALS — BP 126/84 | HR 102 | Ht 63.0 in | Wt 219.0 lb

## 2023-07-16 DIAGNOSIS — I483 Typical atrial flutter: Secondary | ICD-10-CM

## 2023-07-16 DIAGNOSIS — I4819 Other persistent atrial fibrillation: Secondary | ICD-10-CM | POA: Insufficient documentation

## 2023-07-16 DIAGNOSIS — R739 Hyperglycemia, unspecified: Secondary | ICD-10-CM | POA: Diagnosis present

## 2023-07-16 DIAGNOSIS — R002 Palpitations: Secondary | ICD-10-CM

## 2023-07-16 NOTE — Progress Notes (Signed)
  Electrophysiology Office Follow up Visit Note:    Date:  07/16/2023   ID:  Katie Hampton, DOB 1980/08/26, MRN [id number]  PCP:  [facility] Cardiologist:  None  [facility] Electrophysiologist:  Boyce Byes, MD    Interval History:     Katie Hampton is a 43 y.o. female who presents for a follow up visit.   I had seen the patient in the past for her atrial fibrillation.  She has had an atrial fibrillation ablation scheduled 4 times.  The procedure was canceled each time for various reasons.  She is on Eliquis  and Multaq  for her atrial fibrillation.  She is with family today in clinic.  She tells me that she feels poorly with shortness of breath and fatigue.  She has been following closely with her primary care physician for anemia and poor blood sugar control.  She tells me that at home her blood sugars have been 300-500.  Her last hemoglobin at her primary care office was between 9 and 10.  No obvious vaginal bleeding.  No rectal bleeding.  Has not been taking iron supplement or multivitamin for folic acid  repletion.        Past medical, surgical, social and family history were reviewed.  ROS:   Please see the history of present illness.    All other systems reviewed and are negative.  EKGs/Labs/Other Studies Reviewed:    The following studies were reviewed today:     EKG Interpretation Date/Time:  Wednesday July 16 2023 14:24:42 EDT Ventricular Rate:  101 PR Interval:  182 QRS Duration:  80 QT Interval:  368 QTC Calculation: 477 R Axis:   34  Text Interpretation: Sinus tachycardia with Premature supraventricular complexes Confirmed by Harvie Liner [phone]) on 07/16/2023 2:40:17 PM    Physical Exam:    VS:  BP 126/84 (BP Location: Left Arm, Patient Position: Sitting, Cuff Size: Large)   Pulse (!) 102   Ht 5\' 3"  (1.6 m)   Wt 219 lb (99.3 kg)   SpO2 97%   BMI 38.79 kg/m     Wt Readings from Last 3 Encounters:   07/16/23 219 lb (99.3 kg)  04/13/23 215 lb (97.5 kg)  03/24/23 219 lb (99.3 kg)     GEN: no distress CARD: Regular rhythm, tachycardic, No MRG RESP: No IWOB. CTAB.      ASSESSMENT:    1. Persistent atrial fibrillation (HCC)   2. Typical atrial flutter (HCC)   3. Palpitations    PLAN:    In order of problems listed above:  #Sinus tachycardia I am concerned that her sinus tachycardia may be secondary to anemia.  I am going to repeat a CBC today.  #Diabetes Poorly controlled.  She should follow-up closely with her primary care physician.  I think this is also contributing to her general malaise.  #Atrial fibrillation On Eliquis  and Multaq .  She was previously on flecainide . Continue Multaq  for now  CBC, BMP and hemoglobin A1c today.  Follow-up 6 months with APP.      Signed, Harvie Liner, MD, [facility], [facility] 07/16/2023 2:40 PM    Electrophysiology [facility]

## 2023-07-16 NOTE — Patient Instructions (Addendum)
 Medication Instructions:  Your physician recommends that you continue on your current medications as directed. Please refer to the Current Medication list given to you today.  *If you need a refill on your cardiac medications before your next appointment, please call your pharmacy*  Lab Work: TODAY: CBC, BMET, HgBA1C  Follow-Up: At [facility], you and your health needs are our priority.  As part of our continuing mission to provide you with exceptional heart care, our providers are all part of one team.  This team includes your primary Cardiologist (physician) and Advanced Practice Providers or APPs (Physician Assistants and Nurse Practitioners) who all work together to provide you with the care you need, when you need it.   Follow up in 6 moths with EP APP  Follow up with your PCP in the next 4 weeks

## 2023-07-17 ENCOUNTER — Encounter: Payer: Self-pay | Admitting: Cardiology

## 2023-07-17 LAB — BASIC METABOLIC PANEL WITH GFR
BUN/Creatinine Ratio: 2 — ABNORMAL LOW (ref 9–23)
BUN: 2 mg/dL — ABNORMAL LOW (ref 6–24)
CO2: 20 mmol/L (ref 20–29)
Calcium: 7.7 mg/dL — ABNORMAL LOW (ref 8.7–10.2)
Chloride: 95 mmol/L — ABNORMAL LOW (ref 96–106)
Creatinine, Ser: 0.94 mg/dL (ref 0.57–1.00)
Glucose: 296 mg/dL — ABNORMAL HIGH (ref 70–99)
Potassium: 4.2 mmol/L (ref 3.5–5.2)
Sodium: 136 mmol/L (ref 134–144)
eGFR: 78 mL/min/{1.73_m2} (ref >59)

## 2023-07-17 LAB — HEMOGLOBIN A1C
Est. average glucose Bld gHb Est-mCnc: 197 mg/dL
Hgb A1c MFr Bld: 8.5 % — ABNORMAL HIGH (ref 4.8–5.6)

## 2023-07-17 LAB — CBC
Hematocrit: 33.5 % — ABNORMAL LOW (ref 34.0–46.6)
Hemoglobin: 11.2 g/dL (ref 11.1–15.9)
MCH: 38.5 pg — ABNORMAL HIGH (ref 26.6–33.0)
MCHC: 33.4 g/dL (ref 31.5–35.7)
MCV: 115 fL — ABNORMAL HIGH (ref 79–97)
Platelets: 175 10*3/uL (ref 150–450)
RBC: 2.91 x10E6/uL — ABNORMAL LOW (ref 3.77–5.28)
RDW: 15.5 % — ABNORMAL HIGH (ref 11.7–15.4)
WBC: 8.2 10*3/uL (ref 3.4–10.8)

## 2023-10-06 ENCOUNTER — Other Ambulatory Visit: Payer: Self-pay

## 2023-10-06 ENCOUNTER — Emergency Department (HOSPITAL_COMMUNITY)
Admission: EM | Admit: 2023-10-06 | Discharge: 2023-10-06 | Disposition: A | Discharge status: Home / Self Care | Attending: Emergency Medicine | Admitting: Emergency Medicine

## 2023-10-06 DIAGNOSIS — Z7901 Long term (current) use of anticoagulants: Secondary | ICD-10-CM | POA: Insufficient documentation

## 2023-10-06 DIAGNOSIS — Z79899 Other long term (current) drug therapy: Secondary | ICD-10-CM | POA: Insufficient documentation

## 2023-10-06 DIAGNOSIS — E876 Hypokalemia: Secondary | ICD-10-CM | POA: Insufficient documentation

## 2023-10-06 DIAGNOSIS — R739 Hyperglycemia, unspecified: Secondary | ICD-10-CM | POA: Insufficient documentation

## 2023-10-06 DIAGNOSIS — Z794 Long term (current) use of insulin: Secondary | ICD-10-CM | POA: Insufficient documentation

## 2023-10-06 DIAGNOSIS — T7840XA Allergy, unspecified, initial encounter: Secondary | ICD-10-CM | POA: Insufficient documentation

## 2023-10-06 DIAGNOSIS — I4811 Longstanding persistent atrial fibrillation: Secondary | ICD-10-CM | POA: Diagnosis not present

## 2023-10-06 LAB — CBC
HCT: 30.4 % — ABNORMAL LOW (ref 36.0–46.0)
Hemoglobin: 10 g/dL — ABNORMAL LOW (ref 12.0–15.0)
MCH: 35 pg — ABNORMAL HIGH (ref 26.0–34.0)
MCHC: 32.9 g/dL (ref 30.0–36.0)
MCV: 106.3 fL — ABNORMAL HIGH (ref 80.0–100.0)
Platelets: 216 K/uL (ref 150–400)
RBC: 2.86 MIL/uL — ABNORMAL LOW (ref 3.87–5.11)
RDW: 19.9 % — ABNORMAL HIGH (ref 11.5–15.5)
WBC: 9.5 K/uL (ref 4.0–10.5)
nRBC: 0.7 % — ABNORMAL HIGH (ref 0.0–0.2)

## 2023-10-06 LAB — BASIC METABOLIC PANEL WITH GFR
Anion gap: 19 — ABNORMAL HIGH (ref 5–15)
BUN: 7 mg/dL (ref 6–20)
CO2: 17 mmol/L — ABNORMAL LOW (ref 22–32)
Calcium: 7.3 mg/dL — ABNORMAL LOW (ref 8.9–10.3)
Chloride: 99 mmol/L (ref 98–111)
Creatinine, Ser: 0.88 mg/dL (ref 0.44–1.00)
GFR, Estimated: >60 mL/min (ref >60)
Glucose, Bld: 438 mg/dL — ABNORMAL HIGH (ref 70–99)
Potassium: 3 mmol/L — ABNORMAL LOW (ref 3.5–5.1)
Sodium: 135 mmol/L (ref 135–145)

## 2023-10-06 MED ORDER — EPINEPHRINE 0.3 MG/0.3ML IJ SOAJ: 0.3000 mg | INTRAMUSCULAR | 0 refills | Status: AC | PRN

## 2023-10-06 MED ORDER — METOPROLOL TARTRATE 25 MG PO TABS
50.0000 mg | ORAL_TABLET | Freq: Two times a day (BID) | ORAL | Status: DC
  Administered 2023-10-06: 50 mg via ORAL
  Filled 2023-10-06: qty 2

## 2023-10-06 MED ORDER — CLINDAMYCIN HCL 300 MG PO CAPS
300.0000 mg | ORAL_CAPSULE | Freq: Three times a day (TID) | ORAL | 0 refills | Status: AC
Start: 2023-10-06 — End: 2023-10-11

## 2023-10-06 MED ORDER — POTASSIUM CHLORIDE ER 10 MEQ PO TBCR: 10.0000 meq | EXTENDED_RELEASE_TABLET | Freq: Every day | ORAL | 0 refills | Status: DC

## 2023-10-06 MED ORDER — DRONEDARONE HCL 400 MG PO TABS
400.0000 mg | ORAL_TABLET | Freq: Two times a day (BID) | ORAL | Status: DC
  Administered 2023-10-06: 400 mg via ORAL
  Filled 2023-10-06: qty 1

## 2023-10-06 MED ORDER — APIXABAN 5 MG PO TABS
5.0000 mg | ORAL_TABLET | Freq: Two times a day (BID) | ORAL | Status: DC
  Administered 2023-10-06: 5 mg via ORAL
  Filled 2023-10-06: qty 1

## 2023-10-06 NOTE — ED Provider Notes (Signed)
 Katie Hampton EMERGENCY DEPARTMENT AT [facility] Provider Note   CSN: [id number] Arrival date & time: 10/06/23  9281     Patient presents with: Allergic Reaction   Katie Hampton is a 43 y.o. female w/ hx of A Fib on Multaq  400 mg BID, metoprolol  50 mg BID, and eliquis  5 mg BID, here with lip and tongue swelling.  Patient reports she took a leftover penicillin antibiotic for dental pain thinking she had a dental infection. She's taken it before.  However after taking this medicine she noticed tingling, then swelling of her lips, tongue and throat.  No hives.  No prior hx of allergies to penicillins (she's taken them before), but does have history of flecainide  intolerance.  EMS gave patient 0.3 mg epi, 125 mg solumedrol, 50 mg benadryl, and albuterol 5 mg.  Pt is feeling better now but still has tingling on lips.   She did not take any of her normal morning medicine today.  {Add pertinent medical, surgical, social history, OB history to HPI:32947} HPI     Prior to Admission medications   Medication Sig Start Date End Date Taking? Authorizing Provider  acetaminophen  (TYLENOL ) 500 MG tablet Take 500-1,000 mg by mouth every 6 (six) hours as needed (pain.).    [provider]  AIRSUPRA 90-80 MCG/ACT AERO Inhale into the lungs. 07/11/23   [provider]  amoxicillin -clavulanate (AUGMENTIN) 875-125 MG tablet Take 1 tablet by mouth 2 (two) times daily. Patient not taking: Reported on 07/16/2023 03/31/23   [provider]  apixaban  (ELIQUIS ) 5 MG TABS tablet Take 1 tablet (5 mg total) by mouth 2 (two) times daily. Patient not taking: Reported on 07/16/2023 01/06/23   Riddle, Suzann, NP  apixaban  (ELIQUIS ) 5 MG TABS tablet Take 1 tablet (5 mg total) by mouth 2 (two) times daily. 06/05/23   Riddle, Suzann, NP  busPIRone (BUSPAR) 5 MG tablet Take 5 mg by mouth 3 (three) times daily. 03/26/23   [provider]  chlordiazePOXIDE (LIBRIUM) 5 MG capsule Take 5  mg by mouth daily. 05/15/23   [provider]  desvenlafaxine (PRISTIQ) 25 MG 24 hr tablet Take 25 mg by mouth at bedtime.    [provider]  dronedarone  (MULTAQ ) 400 MG tablet Take 1 tablet (400 mg total) by mouth 2 (two) times daily with a meal. 07/01/23   Cindie Ole DASEN, MD  fenofibrate  micronized (LOFIBRA) 200 MG capsule Take 200 mg by mouth daily. Patient not taking: Reported on 07/16/2023 03/04/23   [provider]  ferrous sulfate  ER (SLOW FE) 142 (45 Fe) MG TBCR tablet Take 1 tablet (142 mg total) by mouth daily. 03/03/23   Lue Elsie BROCKS, MD  gabapentin  (NEURONTIN ) 600 MG tablet Take 600 mg by mouth daily as needed (nerve pain).    [provider]  insulin  NPH (HUMULIN  N,NOVOLIN N) 100 UNIT/ML injection Inject 10-22 Units into the skin 2 (two) times daily before a meal. Inject 20-22 units subcuteanously in the morning, and inject 10-14 units subcutaneoulsy in the evening.    [provider]  insulin  regular (NOVOLIN R,HUMULIN  R) 100 units/mL injection Inject 8-22 Units into the skin 2 (two) times daily before a meal. Per patients sliding scale: breakfast 20-22 units, supper 8-12    [provider]  Magnesium  Gluconate 500 (27 Mg) MG TABS Take 1 tablet (500 mg total) by mouth daily. 03/14/23   Armenta Canning, MD  metoprolol  tartrate (LOPRESSOR ) 50 MG tablet TAKE 1 TABLET BY MOUTH TWICE  A DAY 05/08/23   Cindie Ole DASEN, MD  Multiple Vitamins-Minerals (MULTIVITAMIN WITH MINERALS) tablet Take 1 tablet by mouth daily. 03/03/23 03/02/24  Lue Elsie BROCKS, MD  norethindrone  (AYGESTIN ) 5 MG tablet Take 2 tablets (10 mg total) by mouth daily. 04/13/23 06/12/23  Levander Slate, MD  Potassium 99 MG TABS Take 99 mg by mouth every 3 (three) days.    [provider]  SUMAtriptan  (IMITREX ) 100 MG tablet Take 100 mg by mouth every 2 (two) hours as needed for migraine. 05/03/22   [provider]  vitamin B-12 (CYANOCOBALAMIN ) 500  MCG tablet Take 500 mcg by mouth daily.    [provider]  Vitamin D, Ergocalciferol, (DRISDOL) 1.25 MG (50000 UNIT) CAPS capsule Take 50,000 Units by mouth every Wednesday. 05/03/22   [provider]    Allergies: Flecainide  and Penicillins    Review of Systems  Updated Vital Signs BP 132/78   Pulse (!) 154   Resp 16   Ht 5' 3 (1.6 m)   Wt 100.7 kg   SpO2 100%   BMI 39.33 kg/m   Physical Exam Constitutional:      General: She is not in acute distress. HENT:     Head: Normocephalic and atraumatic.     Comments: Oropharynx non-erythematous.  No tonsillar swelling or exudate.  No uvular deviation.  No drooling. No brawny edema. No stridor. Voice is not muffled. Eyes:     Conjunctiva/sclera: Conjunctivae normal.     Pupils: Pupils are equal, round, and reactive to light.  Cardiovascular:     Rate and Rhythm: Tachycardia present. Rhythm irregular.  Pulmonary:     Effort: Pulmonary effort is normal. No respiratory distress.  Abdominal:     General: There is no distension.     Tenderness: There is no abdominal tenderness.  Skin:    General: Skin is warm and dry.  Neurological:     General: No focal deficit present.     Mental Status: She is alert. Mental status is at baseline.  Psychiatric:        Mood and Affect: Mood normal.        Behavior: Behavior normal.     (all labs ordered are listed, but only abnormal results are displayed) Labs Reviewed  BASIC METABOLIC PANEL WITH GFR  CBC    EKG: None  Radiology: No results found.  {Document cardiac monitor, telemetry assessment procedure when appropriate:32947} Procedures   Medications Ordered in the ED  apixaban  (ELIQUIS ) tablet 5 mg (has no administration in time range)  metoprolol  tartrate (LOPRESSOR ) tablet 50 mg (has no administration in time range)  dronedarone  (MULTAQ ) tablet 400 mg (has no administration in time range)      {Click here for ABCD2, HEART and other calculators  REFRESH Note before signing:1}                              Medical Decision Making Amount and/or Complexity of Data Reviewed Labs: ordered.  Risk Prescription drug management.   This patient presents to the ED with concern for lip and tongue swelling. This involves an extensive number of treatment options, and is a complaint that carries with it a high risk of complications and morbidity.  The differential diagnosis includes allergic reaction vs angioedema vs other  Additional history obtained from EMS  I ordered and personally interpreted labs.  The pertinent results include:  ***  The patient was maintained on a  cardiac monitor.  I personally viewed and interpreted the cardiac monitored which showed an underlying rhythm of: A Fib with RVR  I ordered medication including home HR control and A/C medication  Patient already received full anaphylaxis treatment per EMS prior to arrival.  [place] continue to monitor.  She is not wheezing for me.  Lungs CTAB.  Voice non-muffled, clear.  No stridor.  Airway is patent.  I have reviewed the patients home medicines and have made adjustments as needed.  Penicillin added to list of medical allergies at this time - until she is able to get specifically tested for this at an allergen center.  I recommended she avoid this class of drugs.  Test Considered: doubt acute PE; doubt PNA; no indication for thoracic imaging or CT neck imaging at this time.  After the interventions noted above, I reevaluated the patient and found that they have: {resolved/improved/worsened:23923::improved}   Disposition:  After consideration of the diagnostic results and the patients response to treatment, I feel that the patent would benefit from ***.   {Document critical care time when appropriate  Document review of labs and clinical decision tools ie CHADS2VASC2, etc  Document your independent review of radiology images and any outside records  Document your  discussion with family members, caretakers and with consultants  Document social determinants of health affecting pt's care  Document your decision making why or why not admission, treatments were needed:32947:::1}   Final diagnoses:  None    ED Discharge Orders     None

## 2023-10-06 NOTE — Discharge Instructions (Addendum)
 In addition to your allergic reaction issue today, we noticed that your potassium level was low (3.0), your blood sugars were high (400).  High blood sugar could have been caused by the steroids given to you by the paramedics.  But you should have both your potassium and blood sugar rechecked with your PCP this week.  Finally, your tests showed you may be a bit dehydrated - please drink LOTS of water the next 2 or 3 days.

## 2023-10-06 NOTE — ED Notes (Signed)
 Paper work reviewed with pt. Pt leaving for home in no new onset distress at this time.

## 2023-10-06 NOTE — ED Triage Notes (Addendum)
 Pt bib [facility] with anaphylaxis to penicillin. Took dose at 5:00am, began experiencing throat tightening and facial and tongue swelling, bilateral expiratory wheezing. Has taken penicillin before and never had this reaction. Given with ems: 5mg  albuterol,  0.5 atrovent, 0.3 epi I'm x3, 50 mg benadryl im. No n/v  128/84 100% on 2L 243 cbg

## 2023-10-06 NOTE — ED Notes (Signed)
 Phlebotomy asked to stick

## 2023-10-06 NOTE — ED Notes (Signed)
 CCMD Called

## 2023-11-27 ENCOUNTER — Other Ambulatory Visit: Payer: Self-pay | Admitting: Cardiology

## 2023-11-27 MED ORDER — MULTAQ 400 MG PO TABS: 400.0000 mg | ORAL_TABLET | Freq: Two times a day (BID) | ORAL | 0 refills | Status: DC

## 2023-11-27 NOTE — Addendum Note (Signed)
 Addended by: TONIA FRITTER, Senia Even C on: 11/27/2023 10:57 AM   Modules accepted: Orders

## 2023-12-14 ENCOUNTER — Emergency Department (HOSPITAL_BASED_OUTPATIENT_CLINIC_OR_DEPARTMENT_OTHER)
Admission: EM | Admit: 2023-12-14 | Discharge: 2023-12-14 | Disposition: A | Discharge status: Home / Self Care | Attending: Emergency Medicine | Admitting: Emergency Medicine

## 2023-12-14 ENCOUNTER — Encounter (HOSPITAL_BASED_OUTPATIENT_CLINIC_OR_DEPARTMENT_OTHER): Payer: Self-pay

## 2023-12-14 ENCOUNTER — Other Ambulatory Visit: Payer: Self-pay

## 2023-12-14 ENCOUNTER — Emergency Department (HOSPITAL_BASED_OUTPATIENT_CLINIC_OR_DEPARTMENT_OTHER)

## 2023-12-14 DIAGNOSIS — E1065 Type 1 diabetes mellitus with hyperglycemia: Secondary | ICD-10-CM | POA: Diagnosis not present

## 2023-12-14 DIAGNOSIS — Z7901 Long term (current) use of anticoagulants: Secondary | ICD-10-CM | POA: Diagnosis not present

## 2023-12-14 DIAGNOSIS — R739 Hyperglycemia, unspecified: Secondary | ICD-10-CM | POA: Diagnosis present

## 2023-12-14 LAB — BASIC METABOLIC PANEL WITH GFR
Anion gap: 17 — ABNORMAL HIGH (ref 5–15)
BUN: 10 mg/dL (ref 6–20)
CO2: 22 mmol/L (ref 22–32)
Calcium: 8 mg/dL — ABNORMAL LOW (ref 8.9–10.3)
Chloride: 96 mmol/L — ABNORMAL LOW (ref 98–111)
Creatinine, Ser: 1.01 mg/dL — ABNORMAL HIGH (ref 0.44–1.00)
GFR, Estimated: >60 mL/min (ref >60)
Glucose, Bld: 358 mg/dL — ABNORMAL HIGH (ref 70–99)
Potassium: 3.5 mmol/L (ref 3.5–5.1)
Sodium: 135 mmol/L (ref 135–145)

## 2023-12-14 LAB — CBG MONITORING, ED
Glucose-Capillary: 303 mg/dL — ABNORMAL HIGH (ref 70–99)
Glucose-Capillary: 202 mg/dL — ABNORMAL HIGH (ref 70–99)

## 2023-12-14 LAB — URINALYSIS, ROUTINE W REFLEX MICROSCOPIC
Bilirubin Urine: NEGATIVE
Glucose, UA: >=500 mg/dL — AB
Ketones, ur: NEGATIVE mg/dL
Leukocytes,Ua: NEGATIVE
Nitrite: NEGATIVE
Protein, ur: NEGATIVE mg/dL
Specific Gravity, Urine: 1.015 (ref 1.005–1.030)
pH: 7 (ref 5.0–8.0)

## 2023-12-14 LAB — CBC
HCT: 31 % — ABNORMAL LOW (ref 36.0–46.0)
Hemoglobin: 9.9 g/dL — ABNORMAL LOW (ref 12.0–15.0)
MCH: 29 pg (ref 26.0–34.0)
MCHC: 31.9 g/dL (ref 30.0–36.0)
MCV: 90.9 fL (ref 80.0–100.0)
Platelets: 207 K/uL (ref 150–400)
RBC: 3.41 MIL/uL — ABNORMAL LOW (ref 3.87–5.11)
RDW: 25.3 % — ABNORMAL HIGH (ref 11.5–15.5)
WBC: 6 K/uL (ref 4.0–10.5)
nRBC: 0.5 % — ABNORMAL HIGH (ref 0.0–0.2)

## 2023-12-14 LAB — I-STAT VENOUS BLOOD GAS, ED
Acid-Base Excess: 0 mmol/L (ref 0.0–2.0)
Bicarbonate: 23.9 mmol/L (ref 20.0–28.0)
Calcium, Ion: 0.98 mmol/L — ABNORMAL LOW (ref 1.15–1.40)
HCT: 28 % — ABNORMAL LOW (ref 36.0–46.0)
Hemoglobin: 9.5 g/dL — ABNORMAL LOW (ref 12.0–15.0)
O2 Saturation: 72 %
Potassium: 3.2 mmol/L — ABNORMAL LOW (ref 3.5–5.1)
Sodium: 136 mmol/L (ref 135–145)
TCO2: 25 mmol/L (ref 22–32)
pCO2, Ven: 33.8 mmHg — ABNORMAL LOW (ref 44–60)
pH, Ven: 7.458 — ABNORMAL HIGH (ref 7.25–7.43)
pO2, Ven: 35 mmHg (ref 32–45)

## 2023-12-14 LAB — PREGNANCY, URINE: Preg Test, Ur: NEGATIVE

## 2023-12-14 LAB — URINALYSIS, MICROSCOPIC (REFLEX)

## 2023-12-14 LAB — TROPONIN T, HIGH SENSITIVITY
Troponin T High Sensitivity: <15 ng/L (ref 0–19)
Troponin T High Sensitivity: <15 ng/L (ref 0–19)

## 2023-12-14 LAB — MAGNESIUM: Magnesium: 1.9 mg/dL (ref 1.7–2.4)

## 2023-12-14 MED ORDER — LACTATED RINGERS IV BOLUS
500.0000 mL | Freq: Once | INTRAVENOUS | Status: AC
  Administered 2023-12-14: 500 mL via INTRAVENOUS

## 2023-12-14 NOTE — ED Provider Notes (Signed)
 [facility] Provider Note   CSN: [id number] Arrival date & time: 12/14/23  1018     Patient presents with: multiple complaints   Katie Hampton is a 43 y.o. female.   42 year old female presenting with multiple complaints.  Patient had blood work completed at her PCP office on Wednesday, she was contacted by her PCP yesterday and told that her potassium/magnesium  were low, she was instructed to come to the emergency department yesterday however she states that she was not feeling well so decided not to come in until today.  She has been on potassium/magnesium  supplements at the direction of her PCP.  She endorses vaginal bleeding for 5 days, she typically does not have a period because she is on OCPs however she missed her OCPs for 4 days, thus triggering breakthrough bleeding, she reports that her bleeding is not heavy and she typically uses only 3 pads per day, she does feel that the bleeding is lessening.  She denies nausea/vomiting/abdominal pain/chest pain, endorses occasional breathy feeling which she attributes to her A-fib, no shortness of breath currently.  She also endorses some tingling in her left arm that has been occurring off and on for approximately 1 week, this is not currently present.  She endorses frequent episodes of diarrhea which she attributes to her medications that she is on, she is a type I diabetic and did take her insulin  this morning but feels that it is not had time to kick in.  She is on Eliquis .        Prior to Admission medications   Medication Sig Start Date End Date Taking? Authorizing Provider  acetaminophen  (TYLENOL ) 500 MG tablet Take 500-1,000 mg by mouth every 6 (six) hours as needed (pain.).    [provider]  AIRSUPRA 90-80 MCG/ACT AERO Inhale into the lungs. 07/11/23   [provider]  apixaban  (ELIQUIS ) 5 MG TABS tablet Take 1 tablet (5 mg total) by mouth 2 (two) times  daily. Patient not taking: Reported on 07/16/2023 01/06/23   Riddle, Suzann, NP  apixaban  (ELIQUIS ) 5 MG TABS tablet Take 1 tablet (5 mg total) by mouth 2 (two) times daily. 06/05/23   Riddle, Suzann, NP  busPIRone (BUSPAR) 5 MG tablet Take 5 mg by mouth 3 (three) times daily. 03/26/23   [provider]  chlordiazePOXIDE (LIBRIUM) 5 MG capsule Take 5 mg by mouth daily. 05/15/23   [provider]  desvenlafaxine (PRISTIQ) 25 MG 24 hr tablet Take 25 mg by mouth at bedtime.    [provider]  dronedarone  (MULTAQ ) 400 MG tablet Take 1 tablet (400 mg total) by mouth 2 (two) times daily with a meal. 11/27/23   Cindie Ole DASEN, MD  EPINEPHrine  0.3 mg/0.3 mL IJ SOAJ injection Inject 0.3 mg into the muscle as needed for anaphylaxis. 10/06/23   Cottie Donnice PARAS, MD  fenofibrate  micronized (LOFIBRA) 200 MG capsule Take 200 mg by mouth daily. Patient not taking: Reported on 07/16/2023 03/04/23   [provider]  ferrous sulfate  ER (SLOW FE) 142 (45 Fe) MG TBCR tablet Take 1 tablet (142 mg total) by mouth daily. 03/03/23   Lue Elsie BROCKS, MD  gabapentin  (NEURONTIN ) 600 MG tablet Take 600 mg by mouth daily as needed (nerve pain).    [provider]  insulin  NPH (HUMULIN  N,NOVOLIN N) 100 UNIT/ML injection Inject 10-22 Units into the skin 2 (two) times daily before a meal. Inject 20-22 units subcuteanously in the morning, and inject  10-14 units subcutaneoulsy in the evening.    [provider]  insulin  regular (NOVOLIN R,HUMULIN  R) 100 units/mL injection Inject 8-22 Units into the skin 2 (two) times daily before a meal. Per patients sliding scale: breakfast 20-22 units, supper 8-12    [provider]  Magnesium  Gluconate 500 (27 Mg) MG TABS Take 1 tablet (500 mg total) by mouth daily. 03/14/23   Armenta Canning, MD  metoprolol  tartrate (LOPRESSOR ) 50 MG tablet TAKE 1 TABLET BY MOUTH TWICE A DAY 05/08/23   Cindie Ole DASEN, MD  Multiple  Vitamins-Minerals (MULTIVITAMIN WITH MINERALS) tablet Take 1 tablet by mouth daily. 03/03/23 03/02/24  Lue Elsie BROCKS, MD  norethindrone  (AYGESTIN ) 5 MG tablet Take 2 tablets (10 mg total) by mouth daily. 04/13/23 06/12/23  Levander Slate, MD  Potassium 99 MG TABS Take 99 mg by mouth every 3 (three) days.    [provider]  potassium chloride  (KLOR-CON ) 10 MEQ tablet Take 1 tablet (10 mEq total) by mouth daily. 10/06/23   Cottie Donnice PARAS, MD  SUMAtriptan  (IMITREX ) 100 MG tablet Take 100 mg by mouth every 2 (two) hours as needed for migraine. 05/03/22   [provider]  vitamin B-12 (CYANOCOBALAMIN ) 500 MCG tablet Take 500 mcg by mouth daily.    [provider]  Vitamin D, Ergocalciferol, (DRISDOL) 1.25 MG (50000 UNIT) CAPS capsule Take 50,000 Units by mouth every Wednesday. 05/03/22   [provider]    Allergies: Flecainide  and Penicillins    Review of Systems  Genitourinary:  Positive for vaginal bleeding.    Updated Vital Signs  Vitals:   12/14/23 1027  BP: 113/76  Pulse: 86  Resp: 16  Temp: 98.6 F (37 C)  TempSrc: Oral  SpO2: 100%     Physical Exam Vitals and nursing note reviewed.  HENT:     Head: Normocephalic.  Eyes:     Extraocular Movements: Extraocular movements intact.  Cardiovascular:     Rate and Rhythm: Normal rate and regular rhythm.     Heart sounds: Normal heart sounds.  Pulmonary:     Effort: Pulmonary effort is normal.     Breath sounds: Normal breath sounds.  Abdominal:     Palpations: Abdomen is soft.     Tenderness: There is no abdominal tenderness. There is no guarding.  Musculoskeletal:     Cervical back: Normal range of motion.     Right lower leg: No edema.     Left lower leg: No edema.     Comments: Moves all extremity spontaneously without difficulty 5/5 strength against resistance of bilateral upper and lower extremities  Skin:    General: Skin is warm and dry.  Neurological:     General: No focal  deficit present.     Mental Status: She is alert and oriented to person, place, and time.     Sensory: No sensory deficit.     Motor: No weakness.     Comments: Facial expressions are symmetric and intact without evidence of facial droop No appreciable sensory deficits of bilateral upper extremities Normal cerebellar testing without ataxia, including finger-to-nose      (all labs ordered are listed, but only abnormal results are displayed) Labs Reviewed  BASIC METABOLIC PANEL WITH GFR - Abnormal; Notable for the following components:      Result Value   Chloride 96 (*)    Glucose, Bld 358 (*)    Creatinine, Ser 1.01 (*)    Calcium 8.0 (*)    Anion  gap 17 (*)    All other components within normal limits  CBC - Abnormal; Notable for the following components:   RBC 3.41 (*)    Hemoglobin 9.9 (*)    HCT 31.0 (*)    RDW 25.3 (*)    nRBC 0.5 (*)    All other components within normal limits  URINALYSIS, ROUTINE W REFLEX MICROSCOPIC - Abnormal; Notable for the following components:   Glucose, UA >=500 (*)    Hgb urine dipstick LARGE (*)    All other components within normal limits  URINALYSIS, MICROSCOPIC (REFLEX) - Abnormal; Notable for the following components:   Bacteria, UA RARE (*)    All other components within normal limits  CBG MONITORING, ED - Abnormal; Notable for the following components:   Glucose-Capillary 303 (*)    All other components within normal limits  I-STAT VENOUS BLOOD GAS, ED - Abnormal; Notable for the following components:   pH, Ven 7.458 (*)    pCO2, Ven 33.8 (*)    Potassium 3.2 (*)    Calcium, Ion 0.98 (*)    HCT 28.0 (*)    Hemoglobin 9.5 (*)    All other components within normal limits  CBG MONITORING, ED - Abnormal; Notable for the following components:   Glucose-Capillary 202 (*)    All other components within normal limits  PREGNANCY, URINE  MAGNESIUM   TROPONIN T, HIGH SENSITIVITY  TROPONIN T, HIGH SENSITIVITY     EKG: None  Radiology: DG Chest 2 View Result Date: 12/14/2023 EXAM: 2 VIEW(S) XRAY OF THE CHEST 12/14/2023 10:51:53 AM COMPARISON: 03/24/2023 CLINICAL HISTORY: SHOB. Also reports intermittent tingling in L arm for 1 week. Intermittent shortness of breath w exertion and nausea for 1 week. FINDINGS: LUNGS AND PLEURA: No focal pulmonary opacity. No pulmonary edema. No pleural effusion. No pneumothorax. HEART AND MEDIASTINUM: No acute abnormality of the cardiac and mediastinal silhouettes. BONES AND SOFT TISSUES: Thoracic degenerative changes. No acute fracture. IMPRESSION: 1. Normal chest radiograph. No acute cardiopulmonary process. Electronically signed by: Waddell Calk MD 12/14/2023 11:10 AM EDT RP Workstation: [id number]     Procedures   Medications Ordered in the ED  lactated ringers bolus 500 mL (500 mLs Intravenous New Bag/Given 12/14/23 1229)                                    Medical Decision Making This patient presents to the ED for concern of multiple complaints, this involves an extensive number of treatment options, and is a complaint that carries with it a high risk of complications and morbidity.  The differential diagnosis includes hypokalemia, hypomagnesemia, ACS, anemia secondary to vaginal bleeding   Co morbidities that complicate the patient evaluation  Type I diabetes   Additional history obtained:  Additional history obtained from record review External records from outside source obtained and reviewed including previous ED note   Lab Tests:  I Ordered, and personally interpreted labs.  The pertinent results include: CBC notable for hemoglobin of 9.9, this is largely similar to her baseline of 10 from 2 months ago, however this has steadily trended down from 12.6 from 8 months ago.  BMP with normal potassium at 3.5, hyperglycemia at 358, creatinine of 1.01 with anion gap of 17.  Urine pregnancy negative.  Magnesium  within normal limits at 1.9.  Initial  troponin <15, repeat remains unchanged.  Urinalysis notable for glucosuria with large RBCs and rare bacteria, this  is likely contaminant from vaginal bleeding, negative nitrite/leukocytes.  VBG without acidosis, pH is 7.458.   Imaging Studies ordered:  I ordered imaging studies including CXR  I independently visualized and interpreted imaging which showed 1. Normal chest radiograph. No acute cardiopulmonary process.  I agree with the radiologist interpretation   Cardiac Monitoring: / EKG:  The patient was maintained on a cardiac monitor.  I personally viewed and interpreted the cardiac monitored which showed an underlying rhythm of: NSR   Problem List / ED Course / Critical interventions / Medication management  I ordered medication including IV fluid bolus for hyperglycemia Reevaluation of the patient after these medicines showed that the patient improved I have reviewed the patients home medicines and have made adjustments as needed   Social Determinants of Health:  Former tobacco use   Test / Admission - Considered:  Physical exam is largely unremarkable as above.  Patient found to have normal potassium and magnesium  today, I suspect that her decreased potassium/magnesium  that was noted at her PCP office was transient and has responded to oral supplementation that she has been on since then.   Patient found to have hemoglobin of 9.9, this is similar to her most recent baseline, patient does endorse vaginal bleeding after abrupt cessation of her OCPs, however she has restarted this medication and the vaginal bleeding seems to be improving.  Pelvic exam deferred today.  She is on Eliquis . Patient is hyperglycemic, she is a type I diabetic and did take her insulin  prior to presenting to the emergency department today. CBG 303, will re-hydrate with LR bolus and reassess.  VBG is reassuring, patient does not appear to be in diabetic ketoacidosis, no urine ketones noted on urinalysis.   CBG did improve to 202 after fluid bolus. Patient also notes transient tingling sensation in her left upper extremity, physical exam is unremarkable, no appreciable sensory deficits, normal strength.  I suspect that this may be secondary to hypokalemia/hypomagnesemia versus anxiety induced. Patient notes occasional breathy sensation which she attributes to her atrial fibrillation, she currently denies chest pain/shortness of breath, ACS workup reassuring as above. I discussed these findings in depth with the patient, I recommend that she follow-up with her PCP for a recheck of her potassium/magnesium  as these were found to be low in the office earlier in the week.  She voiced understanding and is in agreement with this plan.  Return precautions discussed.  She is appropriate for discharge at this time.    Amount and/or Complexity of Data Reviewed Labs: ordered. Radiology: ordered.        Final diagnoses:  Hyperglycemia    ED Discharge Orders     None          Glendia Rocky SAILOR, [place] 12/14/23 1344    Dreama Rocky, MD 12/15/23 1044

## 2023-12-14 NOTE — Discharge Instructions (Signed)
 Your potassium and magnesium  levels are normal today, continue your potassium/magnesium  supplementation as previously directed by your primary care provider, schedule follow-up appointment with your primary care provider this week to reassess your electrolytes.  You were found to be hyperglycemic while in the emergency department today, please continue management of your diabetes as directed by your PCP.  Return to the emergency department if your symptoms worsen.

## 2023-12-14 NOTE — ED Triage Notes (Signed)
 Reports being sent by PCP for low magnesium , potassium.  Also reports intermittent tingling in L arm for 1 week.  Intermittent shortness of breath w exertion and nausea for 1 week.   Also reports vaginal bleeding and lower back pain for 5 days. States she is on her menstrual cycle, but this time is much heavier bleeding. Dysuria  Intermittent lightheadedness for 3 days.    On eliquis 

## 2023-12-23 ENCOUNTER — Encounter (HOSPITAL_BASED_OUTPATIENT_CLINIC_OR_DEPARTMENT_OTHER): Payer: Self-pay

## 2023-12-23 ENCOUNTER — Other Ambulatory Visit: Payer: Self-pay

## 2023-12-23 ENCOUNTER — Emergency Department (HOSPITAL_BASED_OUTPATIENT_CLINIC_OR_DEPARTMENT_OTHER)

## 2023-12-23 ENCOUNTER — Inpatient Hospital Stay (HOSPITAL_BASED_OUTPATIENT_CLINIC_OR_DEPARTMENT_OTHER)
Admission: EM | Admit: 2023-12-23 | Discharge: 2023-12-25 | DRG: 638 | Disposition: A | Discharge status: Home / Self Care | Attending: Family Medicine | Admitting: Family Medicine

## 2023-12-23 DIAGNOSIS — E1042 Type 1 diabetes mellitus with diabetic polyneuropathy: Secondary | ICD-10-CM | POA: Diagnosis present

## 2023-12-23 DIAGNOSIS — Z880 Allergy status to penicillin: Secondary | ICD-10-CM

## 2023-12-23 DIAGNOSIS — F419 Anxiety disorder, unspecified: Secondary | ICD-10-CM | POA: Diagnosis present

## 2023-12-23 DIAGNOSIS — I480 Paroxysmal atrial fibrillation: Secondary | ICD-10-CM | POA: Diagnosis not present

## 2023-12-23 DIAGNOSIS — E785 Hyperlipidemia, unspecified: Secondary | ICD-10-CM | POA: Diagnosis present

## 2023-12-23 DIAGNOSIS — R112 Nausea with vomiting, unspecified: Secondary | ICD-10-CM | POA: Insufficient documentation

## 2023-12-23 DIAGNOSIS — Z794 Long term (current) use of insulin: Secondary | ICD-10-CM

## 2023-12-23 DIAGNOSIS — E66812 Obesity, class 2: Secondary | ICD-10-CM | POA: Diagnosis present

## 2023-12-23 DIAGNOSIS — E109 Type 1 diabetes mellitus without complications: Secondary | ICD-10-CM | POA: Diagnosis present

## 2023-12-23 DIAGNOSIS — F10939 Alcohol use, unspecified with withdrawal, unspecified: Secondary | ICD-10-CM | POA: Diagnosis present

## 2023-12-23 DIAGNOSIS — E1110 Type 2 diabetes mellitus with ketoacidosis without coma: Secondary | ICD-10-CM | POA: Diagnosis not present

## 2023-12-23 DIAGNOSIS — D72829 Elevated white blood cell count, unspecified: Secondary | ICD-10-CM | POA: Diagnosis present

## 2023-12-23 DIAGNOSIS — R0789 Other chest pain: Secondary | ICD-10-CM

## 2023-12-23 DIAGNOSIS — E1010 Type 1 diabetes mellitus with ketoacidosis without coma: Principal | ICD-10-CM | POA: Diagnosis present

## 2023-12-23 DIAGNOSIS — F439 Reaction to severe stress, unspecified: Secondary | ICD-10-CM | POA: Diagnosis present

## 2023-12-23 DIAGNOSIS — F32A Depression, unspecified: Secondary | ICD-10-CM | POA: Diagnosis present

## 2023-12-23 DIAGNOSIS — E669 Obesity, unspecified: Secondary | ICD-10-CM | POA: Diagnosis present

## 2023-12-23 DIAGNOSIS — N179 Acute kidney failure, unspecified: Secondary | ICD-10-CM | POA: Diagnosis present

## 2023-12-23 DIAGNOSIS — Z7901 Long term (current) use of anticoagulants: Secondary | ICD-10-CM

## 2023-12-23 DIAGNOSIS — Z6839 Body mass index (BMI) 39.0-39.9, adult: Secondary | ICD-10-CM

## 2023-12-23 DIAGNOSIS — Z87891 Personal history of nicotine dependence: Secondary | ICD-10-CM

## 2023-12-23 LAB — BASIC METABOLIC PANEL WITH GFR
Anion gap: 29 — ABNORMAL HIGH (ref 5–15)
Anion gap: 18 — ABNORMAL HIGH (ref 5–15)
Anion gap: 15 (ref 5–15)
Anion gap: 15 (ref 5–15)
BUN: 8 mg/dL (ref 6–20)
BUN: 9 mg/dL (ref 6–20)
BUN: 8 mg/dL (ref 6–20)
BUN: 8 mg/dL (ref 6–20)
CO2: 12 mmol/L — ABNORMAL LOW (ref 22–32)
CO2: 18 mmol/L — ABNORMAL LOW (ref 22–32)
CO2: 19 mmol/L — ABNORMAL LOW (ref 22–32)
CO2: 21 mmol/L — ABNORMAL LOW (ref 22–32)
Calcium: 8.1 mg/dL — ABNORMAL LOW (ref 8.9–10.3)
Calcium: 8.2 mg/dL — ABNORMAL LOW (ref 8.9–10.3)
Calcium: 8 mg/dL — ABNORMAL LOW (ref 8.9–10.3)
Calcium: 8.3 mg/dL — ABNORMAL LOW (ref 8.9–10.3)
Chloride: 96 mmol/L — ABNORMAL LOW (ref 98–111)
Chloride: 99 mmol/L (ref 98–111)
Chloride: 102 mmol/L (ref 98–111)
Chloride: 100 mmol/L (ref 98–111)
Creatinine, Ser: 1.25 mg/dL — ABNORMAL HIGH (ref 0.44–1.00)
Creatinine, Ser: 1.09 mg/dL — ABNORMAL HIGH (ref 0.44–1.00)
Creatinine, Ser: 0.97 mg/dL (ref 0.44–1.00)
Creatinine, Ser: 1.02 mg/dL — ABNORMAL HIGH (ref 0.44–1.00)
GFR, Estimated: 55 mL/min — ABNORMAL LOW (ref >60)
GFR, Estimated: >60 mL/min (ref >60)
GFR, Estimated: >60 mL/min (ref >60)
GFR, Estimated: >60 mL/min (ref >60)
Glucose, Bld: 203 mg/dL — ABNORMAL HIGH (ref 70–99)
Glucose, Bld: 166 mg/dL — ABNORMAL HIGH (ref 70–99)
Glucose, Bld: 140 mg/dL — ABNORMAL HIGH (ref 70–99)
Glucose, Bld: 145 mg/dL — ABNORMAL HIGH (ref 70–99)
Potassium: 5.7 mmol/L — ABNORMAL HIGH (ref 3.5–5.1)
Potassium: 5.4 mmol/L — ABNORMAL HIGH (ref 3.5–5.1)
Potassium: 4.9 mmol/L (ref 3.5–5.1)
Potassium: 4.3 mmol/L (ref 3.5–5.1)
Sodium: 137 mmol/L (ref 135–145)
Sodium: 136 mmol/L (ref 135–145)
Sodium: 135 mmol/L (ref 135–145)
Sodium: 135 mmol/L (ref 135–145)

## 2023-12-23 LAB — LIPASE, BLOOD: Lipase: 131 U/L — ABNORMAL HIGH (ref 11–51)

## 2023-12-23 LAB — I-STAT VENOUS BLOOD GAS, ED
Acid-base deficit: 23 mmol/L — ABNORMAL HIGH (ref 0.0–2.0)
Bicarbonate: 6.6 mmol/L — ABNORMAL LOW (ref 20.0–28.0)
Calcium, Ion: 1.03 mmol/L — ABNORMAL LOW (ref 1.15–1.40)
HCT: 40 % (ref 36.0–46.0)
Hemoglobin: 13.6 g/dL (ref 12.0–15.0)
O2 Saturation: 77 %
Patient temperature: 97.4
Potassium: 5 mmol/L (ref 3.5–5.1)
Sodium: 135 mmol/L (ref 135–145)
TCO2: 7 mmol/L — ABNORMAL LOW (ref 22–32)
pCO2, Ven: 24.5 mmHg — ABNORMAL LOW (ref 44–60)
pH, Ven: 7.034 — CL (ref 7.25–7.43)
pO2, Ven: 57 mmHg — ABNORMAL HIGH (ref 32–45)

## 2023-12-23 LAB — COMPREHENSIVE METABOLIC PANEL WITH GFR
ALT: 40 U/L (ref 0–44)
AST: 79 U/L — ABNORMAL HIGH (ref 15–41)
Albumin: 3.9 g/dL (ref 3.5–5.0)
Alkaline Phosphatase: 137 U/L — ABNORMAL HIGH (ref 38–126)
BUN: 10 mg/dL (ref 6–20)
CO2: <7 mmol/L — ABNORMAL LOW (ref 22–32)
Calcium: 8.7 mg/dL — ABNORMAL LOW (ref 8.9–10.3)
Chloride: 91 mmol/L — ABNORMAL LOW (ref 98–111)
Creatinine, Ser: 1.34 mg/dL — ABNORMAL HIGH (ref 0.44–1.00)
GFR, Estimated: 51 mL/min — ABNORMAL LOW (ref >60)
Glucose, Bld: 370 mg/dL — ABNORMAL HIGH (ref 70–99)
Potassium: 5 mmol/L (ref 3.5–5.1)
Sodium: 137 mmol/L (ref 135–145)
Total Bilirubin: 1 mg/dL (ref 0.0–1.2)
Total Protein: 7.8 g/dL (ref 6.5–8.1)

## 2023-12-23 LAB — URINALYSIS, W/ REFLEX TO CULTURE (INFECTION SUSPECTED)
Bilirubin Urine: NEGATIVE
Glucose, UA: >=500 mg/dL — AB
Ketones, ur: 40 mg/dL — AB
Leukocytes,Ua: NEGATIVE
Nitrite: NEGATIVE
Protein, ur: 100 mg/dL — AB
Specific Gravity, Urine: >=1.03 (ref 1.005–1.030)
pH: 5.5 (ref 5.0–8.0)

## 2023-12-23 LAB — CBC WITH DIFFERENTIAL/PLATELET
Abs Immature Granulocytes: 0.34 K/uL — ABNORMAL HIGH (ref 0.00–0.07)
Basophils Absolute: 0.2 K/uL — ABNORMAL HIGH (ref 0.0–0.1)
Basophils Relative: 1 %
Eosinophils Absolute: 0 K/uL (ref 0.0–0.5)
Eosinophils Relative: 0 %
HCT: 35.8 % — ABNORMAL LOW (ref 36.0–46.0)
Hemoglobin: 11.2 g/dL — ABNORMAL LOW (ref 12.0–15.0)
Immature Granulocytes: 2 %
Lymphocytes Relative: 12 %
Lymphs Abs: 2.6 K/uL (ref 0.7–4.0)
MCH: 30 pg (ref 26.0–34.0)
MCHC: 31.3 g/dL (ref 30.0–36.0)
MCV: 96 fL (ref 80.0–100.0)
Monocytes Absolute: 0.7 K/uL (ref 0.1–1.0)
Monocytes Relative: 3 %
Neutro Abs: 18.7 K/uL — ABNORMAL HIGH (ref 1.7–7.7)
Neutrophils Relative %: 82 %
Platelets: 352 K/uL (ref 150–400)
RBC: 3.73 MIL/uL — ABNORMAL LOW (ref 3.87–5.11)
RDW: 22.6 % — ABNORMAL HIGH (ref 11.5–15.5)
Smear Review: NORMAL
WBC: 22.5 K/uL — ABNORMAL HIGH (ref 4.0–10.5)
nRBC: 0.3 % — ABNORMAL HIGH (ref 0.0–0.2)

## 2023-12-23 LAB — HIV ANTIBODY (ROUTINE TESTING W REFLEX): HIV Screen 4th Generation wRfx: NONREACTIVE

## 2023-12-23 LAB — CBG MONITORING, ED
Glucose-Capillary: 374 mg/dL — ABNORMAL HIGH (ref 70–99)
Glucose-Capillary: 276 mg/dL — ABNORMAL HIGH (ref 70–99)
Glucose-Capillary: 255 mg/dL — ABNORMAL HIGH (ref 70–99)
Glucose-Capillary: 205 mg/dL — ABNORMAL HIGH (ref 70–99)
Glucose-Capillary: 219 mg/dL — ABNORMAL HIGH (ref 70–99)

## 2023-12-23 LAB — GLUCOSE, CAPILLARY
Glucose-Capillary: 181 mg/dL — ABNORMAL HIGH (ref 70–99)
Glucose-Capillary: 146 mg/dL — ABNORMAL HIGH (ref 70–99)
Glucose-Capillary: 135 mg/dL — ABNORMAL HIGH (ref 70–99)
Glucose-Capillary: 144 mg/dL — ABNORMAL HIGH (ref 70–99)
Glucose-Capillary: 155 mg/dL — ABNORMAL HIGH (ref 70–99)
Glucose-Capillary: 159 mg/dL — ABNORMAL HIGH (ref 70–99)
Glucose-Capillary: 159 mg/dL — ABNORMAL HIGH (ref 70–99)

## 2023-12-23 LAB — PREGNANCY, URINE: Preg Test, Ur: NEGATIVE

## 2023-12-23 LAB — TROPONIN T, HIGH SENSITIVITY: Troponin T High Sensitivity: <15 ng/L (ref 0–19)

## 2023-12-23 LAB — BETA-HYDROXYBUTYRIC ACID
Beta-Hydroxybutyric Acid: 2.15 mmol/L — ABNORMAL HIGH (ref 0.05–0.27)
Beta-Hydroxybutyric Acid: 0.55 mmol/L — ABNORMAL HIGH (ref 0.05–0.27)
Beta-Hydroxybutyric Acid: 0.32 mmol/L — ABNORMAL HIGH (ref 0.05–0.27)

## 2023-12-23 LAB — MRSA NEXT GEN BY PCR, NASAL: MRSA by PCR Next Gen: NOT DETECTED

## 2023-12-23 LAB — HCG, SERUM, QUALITATIVE: Preg, Serum: NEGATIVE

## 2023-12-23 MED ORDER — LORAZEPAM 1 MG PO TABS
0.0000 mg | ORAL_TABLET | Freq: Four times a day (QID) | ORAL | Status: DC
  Administered 2023-12-24 – 2023-12-25 (×2): 1 mg via ORAL
  Filled 2023-12-23 (×2): qty 1

## 2023-12-23 MED ORDER — METOPROLOL TARTRATE 25 MG PO TABS
50.0000 mg | ORAL_TABLET | Freq: Two times a day (BID) | ORAL | Status: DC
  Administered 2023-12-24 – 2023-12-25 (×4): 50 mg via ORAL
  Filled 2023-12-23 (×4): qty 2

## 2023-12-23 MED ORDER — LACTATED RINGERS IV SOLN
INTRAVENOUS | Status: DC
Start: 2023-12-23 — End: 2023-12-24

## 2023-12-23 MED ORDER — APIXABAN 5 MG PO TABS
5.0000 mg | ORAL_TABLET | Freq: Two times a day (BID) | ORAL | Status: DC
  Administered 2023-12-24 – 2023-12-25 (×4): 5 mg via ORAL
  Filled 2023-12-23 (×4): qty 1

## 2023-12-23 MED ORDER — INSULIN REGULAR(HUMAN) IN NACL 100-0.9 UT/100ML-% IV SOLN: INTRAVENOUS | Status: DC

## 2023-12-23 MED ORDER — INSULIN REGULAR(HUMAN) IN NACL 100-0.9 UT/100ML-% IV SOLN
INTRAVENOUS | Status: DC
  Administered 2023-12-23: 10 [IU]/h via INTRAVENOUS
  Filled 2023-12-23: qty 100

## 2023-12-23 MED ORDER — GABAPENTIN 600 MG PO TABS: 600.0000 mg | ORAL_TABLET | Freq: Every day | ORAL | Status: DC | PRN

## 2023-12-23 MED ORDER — LORAZEPAM 2 MG/ML IJ SOLN
1.0000 mg | Freq: Once | INTRAMUSCULAR | Status: AC
  Administered 2023-12-23: 1 mg via INTRAVENOUS
  Filled 2023-12-23: qty 1

## 2023-12-23 MED ORDER — LORAZEPAM 2 MG/ML IJ SOLN: 0.0000 mg | Freq: Two times a day (BID) | INTRAMUSCULAR | Status: DC

## 2023-12-23 MED ORDER — LACTATED RINGERS IV BOLUS
1000.0000 mL | Freq: Once | INTRAVENOUS | Status: AC
Start: 2023-12-23 — End: 2023-12-23
  Administered 2023-12-23: 1000 mL via INTRAVENOUS

## 2023-12-23 MED ORDER — METOPROLOL TARTRATE 5 MG/5ML IV SOLN
5.0000 mg | Freq: Once | INTRAVENOUS | Status: AC
  Administered 2023-12-23: 5 mg via INTRAVENOUS
  Filled 2023-12-23: qty 5

## 2023-12-23 MED ORDER — ACETAMINOPHEN 500 MG PO TABS
500.0000 mg | ORAL_TABLET | Freq: Four times a day (QID) | ORAL | Status: DC | PRN
Start: 2023-12-23 — End: 2023-12-25
  Administered 2023-12-24 (×2): 1000 mg via ORAL
  Filled 2023-12-23 (×2): qty 2

## 2023-12-23 MED ORDER — LACTATED RINGERS IV BOLUS
1000.0000 mL | Freq: Once | INTRAVENOUS | Status: AC
  Administered 2023-12-23: 1000 mL via INTRAVENOUS

## 2023-12-23 MED ORDER — CHLORHEXIDINE GLUCONATE CLOTH 2 % EX PADS
6.0000 | MEDICATED_PAD | Freq: Every day | CUTANEOUS | Status: DC
  Administered 2023-12-23 – 2023-12-24 (×2): 6 via TOPICAL

## 2023-12-23 MED ORDER — ONDANSETRON HCL 4 MG/2ML IJ SOLN
4.0000 mg | Freq: Once | INTRAMUSCULAR | Status: AC
  Administered 2023-12-23: 4 mg via INTRAVENOUS
  Filled 2023-12-23: qty 2

## 2023-12-23 MED ORDER — LORAZEPAM 2 MG/ML IJ SOLN
0.0000 mg | Freq: Four times a day (QID) | INTRAMUSCULAR | Status: DC
  Administered 2023-12-23 – 2023-12-24 (×4): 2 mg via INTRAVENOUS
  Filled 2023-12-23 (×4): qty 1

## 2023-12-23 MED ORDER — LORAZEPAM 1 MG PO TABS: 0.0000 mg | ORAL_TABLET | Freq: Two times a day (BID) | ORAL | Status: DC

## 2023-12-23 MED ORDER — SODIUM BICARBONATE 8.4 % IV SOLN
50.0000 meq | Freq: Once | INTRAVENOUS | Status: AC
  Administered 2023-12-23: 50 meq via INTRAVENOUS
  Filled 2023-12-23: qty 50

## 2023-12-23 MED ORDER — DEXTROSE IN LACTATED RINGERS 5 % IV SOLN: INTRAVENOUS | Status: DC

## 2023-12-23 MED ORDER — DEXTROSE 50 % IV SOLN: 0.0000 mL | INTRAVENOUS | Status: DC | PRN

## 2023-12-23 MED ORDER — VENLAFAXINE HCL ER 37.5 MG PO CP24
37.5000 mg | ORAL_CAPSULE | Freq: Every day | ORAL | Status: AC
  Administered 2023-12-24: 37.5 mg via ORAL
  Filled 2023-12-23: qty 1

## 2023-12-23 MED ORDER — POTASSIUM CHLORIDE 10 MEQ/100ML IV SOLN
10.0000 meq | INTRAVENOUS | Status: AC
  Administered 2023-12-23 (×2): 10 meq via INTRAVENOUS
  Filled 2023-12-23: qty 100

## 2023-12-23 MED ORDER — MORPHINE SULFATE (PF) 4 MG/ML IV SOLN
4.0000 mg | Freq: Once | INTRAVENOUS | Status: AC
  Administered 2023-12-23: 4 mg via INTRAVENOUS
  Filled 2023-12-23: qty 1

## 2023-12-23 MED ORDER — BUSPIRONE HCL 5 MG PO TABS
15.0000 mg | ORAL_TABLET | Freq: Three times a day (TID) | ORAL | Status: DC
  Administered 2023-12-23 – 2023-12-25 (×6): 15 mg via ORAL
  Filled 2023-12-23 (×6): qty 3

## 2023-12-23 MED ORDER — ADULT MULTIVITAMIN W/MINERALS CH
1.0000 | ORAL_TABLET | Freq: Every day | ORAL | Status: DC
  Administered 2023-12-24 – 2023-12-25 (×2): 1 via ORAL
  Filled 2023-12-23 (×2): qty 1

## 2023-12-23 MED ORDER — SODIUM CHLORIDE 0.9 % IV BOLUS
1000.0000 mL | Freq: Once | INTRAVENOUS | Status: AC
  Administered 2023-12-23: 1000 mL via INTRAVENOUS

## 2023-12-23 MED ORDER — LACTATED RINGERS IV SOLN: INTRAVENOUS | Status: DC

## 2023-12-23 MED ORDER — METOPROLOL TARTRATE 25 MG PO TABS
50.0000 mg | ORAL_TABLET | Freq: Once | ORAL | Status: AC
  Administered 2023-12-23: 50 mg via ORAL
  Filled 2023-12-23: qty 2

## 2023-12-23 NOTE — ED Triage Notes (Signed)
 C/o shortness of breath since this morning, started vomiting & chest pain last night.

## 2023-12-23 NOTE — H&P (Signed)
 History and Physical    Patient: Katie Hampton FMW:[id number] DOB: Jul 26, 1980 DOA: 12/23/2023 DOS: the patient was seen and examined on 12/23/2023 PCP: [facility]  Patient coming from: Home  Chief Complaint: No chief complaint on file.  HPI: Katie Hampton is a 43 y.o. female with PMH of obesity, hyperlipidemia, B12 and folate deficiency, history of pyelonephritis and renal abscess, PAF, diabetes mellitus type 1, peripheral neuropathy and prolonged QT presented to the ED with complaints of abdominal pain, nausea, emesis as well as chest discomfort and hyperglycemia.  Her symptoms started last night she states that she has not been around anybody has been sick.  She states that she has not been in DKA since she was a child and did not expect the symptoms.  She also had some mild diarrhea and abdominal discomfort.  She states that she has vomited multiple times and her emesis was nonbloody.  Denied any fevers and has been compliant with her medications including her insulin  and her anticoagulation.  Given her symptoms worsening she presented to the ED for further evaluation and was found to be in DKA and the hospitalist service was asked to admit this patient for DKA  Patient denies any lightheadedness or dizziness, burning or discomfort in her urine or cough or productive sputum.   Review of Systems: As mentioned in the history of present illness. All other systems reviewed and are negative. Past Medical History:  Diagnosis Date   AF (paroxysmal atrial fibrillation) (HCC)    Diabetes mellitus    Type 1   Pyelonephritis    Past Surgical History:  Procedure Laterality Date   CARDIOVERSION N/A 09/30/2022   Procedure: CARDIOVERSION;  Surgeon: Darron Deatrice LABOR, MD;  Location: [facility] ORS;  Service: Cardiovascular;  Laterality: N/A;   CARDIOVERSION N/A 10/07/2022   Procedure: CARDIOVERSION;  Surgeon: Inocencio Soyla Lunger, MD;  Location: [facility] INVASIVE CV LAB;  Service: Cardiovascular;   Laterality: N/A;   CHOLECYSTECTOMY     EYE SURGERY Bilateral    TUBAL LIGATION Bilateral    Social History:  reports that she has quit smoking. Her smoking use included cigarettes. She has never used smokeless tobacco. She reports current alcohol use. She reports that she does not use drugs.  Allergies  Allergen Reactions   Flecainide  Other (See Comments)    Prolonged QTC   Penicillins Anaphylaxis    Family History  Problem Relation Age of Onset   Kidney disease Sister    Colon cancer Maternal Aunt     Prior to Admission medications   Medication Sig Start Date End Date Taking? Authorizing Provider  acetaminophen  (TYLENOL ) 500 MG tablet Take 500-1,000 mg by mouth every 6 (six) hours as needed (pain.).    [provider]  AIRSUPRA 90-80 MCG/ACT AERO Inhale into the lungs. 07/11/23   [provider]  apixaban  (ELIQUIS ) 5 MG TABS tablet Take 1 tablet (5 mg total) by mouth 2 (two) times daily. Patient not taking: Reported on 07/16/2023 01/06/23   Riddle, Suzann, NP  apixaban  (ELIQUIS ) 5 MG TABS tablet Take 1 tablet (5 mg total) by mouth 2 (two) times daily. 06/05/23   Riddle, Suzann, NP  busPIRone (BUSPAR) 5 MG tablet Take 5 mg by mouth 3 (three) times daily. 03/26/23   [provider]  chlordiazePOXIDE (LIBRIUM) 5 MG capsule Take 5 mg by mouth daily. 05/15/23   [provider]  desvenlafaxine (PRISTIQ) 25 MG 24 hr tablet Take 25 mg by mouth at bedtime.    [provider]  dronedarone  (MULTAQ ) 400 MG tablet Take 1 tablet (400 mg total) by mouth 2 (two) times daily with a meal. 11/27/23   Cindie Ole DASEN, MD  EPINEPHrine  0.3 mg/0.3 mL IJ SOAJ injection Inject 0.3 mg into the muscle as needed for anaphylaxis. 10/06/23   Cottie Donnice PARAS, MD  fenofibrate  micronized (LOFIBRA) 200 MG capsule Take 200 mg by mouth daily. Patient not taking: Reported on 07/16/2023 03/04/23   [provider]  ferrous sulfate  ER (SLOW FE) 142 (45 Fe) MG TBCR tablet  Take 1 tablet (142 mg total) by mouth daily. 03/03/23   Lue Elsie BROCKS, MD  gabapentin  (NEURONTIN ) 600 MG tablet Take 600 mg by mouth daily as needed (nerve pain).    [provider]  insulin  NPH (HUMULIN  N,NOVOLIN N) 100 UNIT/ML injection Inject 10-22 Units into the skin 2 (two) times daily before a meal. Inject 20-22 units subcuteanously in the morning, and inject 10-14 units subcutaneoulsy in the evening.    [provider]  insulin  regular (NOVOLIN R,HUMULIN  R) 100 units/mL injection Inject 8-22 Units into the skin 2 (two) times daily before a meal. Per patients sliding scale: breakfast 20-22 units, supper 8-12    [provider]  Magnesium  Gluconate 500 (27 Mg) MG TABS Take 1 tablet (500 mg total) by mouth daily. 03/14/23   Armenta Canning, MD  metoprolol  tartrate (LOPRESSOR ) 50 MG tablet TAKE 1 TABLET BY MOUTH TWICE A DAY 05/08/23   Cindie Ole DASEN, MD  Multiple Vitamins-Minerals (MULTIVITAMIN WITH MINERALS) tablet Take 1 tablet by mouth daily. 03/03/23 03/02/24  Lue Elsie BROCKS, MD  norethindrone  (AYGESTIN ) 5 MG tablet Take 2 tablets (10 mg total) by mouth daily. 04/13/23 06/12/23  Levander Slate, MD  Potassium 99 MG TABS Take 99 mg by mouth every 3 (three) days.    [provider]  potassium chloride  (KLOR-CON ) 10 MEQ tablet Take 1 tablet (10 mEq total) by mouth daily. 10/06/23   Cottie Donnice PARAS, MD  SUMAtriptan  (IMITREX ) 100 MG tablet Take 100 mg by mouth every 2 (two) hours as needed for migraine. 05/03/22   [provider]  vitamin B-12 (CYANOCOBALAMIN ) 500 MCG tablet Take 500 mcg by mouth daily.    [provider]  Vitamin D, Ergocalciferol, (DRISDOL) 1.25 MG (50000 UNIT) CAPS capsule Take 50,000 Units by mouth every Wednesday. 05/03/22   [provider]   Physical Exam: Vitals:   12/23/23 1300 12/23/23 1330 12/23/23 1436 12/23/23 1440  BP: (!) 180/96 (!) 147/83  138/85  Pulse: (!) 132   (!) 103  Resp: (!) 21 (!) 23   19  Temp:   98.2 F (36.8 C)   TempSrc:   Oral   SpO2: 100%   97%  Weight:      Height:       Examination: Physical Exam:  Constitutional: WN/WD, obese Caucasian female who appears a little uncomfortable Respiratory: Diminished to auscultation bilaterally, no wheezing, rales, rhonchi or crackles. Normal respiratory effort and patient is not tachypenic. No accessory muscle use.  Unlabored breathing Cardiovascular: Tachycardic rate but regular rhythm, no murmurs / rubs / gallops. S1 and S2 auscultated.  Mild extremity edema Abdomen: Soft, slightly-tender, distended secondary body habitus. Bowel sounds positive.  GU: Deferred. Musculoskeletal: No clubbing / cyanosis of digits/nails. No joint deformity upper and lower extremities.  Skin: No rashes, lesions, ulcers on limited skin evaluation. No induration; Warm and dry.  Neurologic: CN 2-12 grossly intact with no focal deficits.  Romberg sign cerebellar reflexes not assessed.  Psychiatric: Normal judgment and insight. Alert and oriented x 3.  Appears calm and pleasant mood and affect  Data Reviewed:  Recent Results (from the past 2160 hours)  Basic metabolic panel     Status: Abnormal   Collection Time: 10/06/23  8:32 AM  Result Value Ref Range   Sodium 135 135 - 145 mmol/L   Potassium 3.0 (L) 3.5 - 5.1 mmol/L   Chloride 99 98 - 111 mmol/L   CO2 17 (L) 22 - 32 mmol/L   Glucose, Bld 438 (H) 70 - 99 mg/dL    Comment: Glucose reference range applies only to samples taken after fasting for at least 8 hours.   BUN 7 6 - 20 mg/dL   Creatinine, Ser 9.11 0.44 - 1.00 mg/dL   Calcium 7.3 (L) 8.9 - 10.3 mg/dL   GFR, Estimated >39 >39 mL/min    Comment: (NOTE) Calculated using the CKD-EPI Creatinine Equation (2021)    Anion gap 19 (H) 5 - 15    Comment: Performed at [facility], [address], [place], [place] [address]  CBC     Status: Abnormal   Collection Time: 10/06/23  8:32 AM  Result Value Ref Range   WBC 9.5 4.0 - 10.5  K/uL   RBC 2.86 (L) 3.87 - 5.11 MIL/uL   Hemoglobin 10.0 (L) 12.0 - 15.0 g/dL   HCT 69.5 (L) 63.9 - 53.9 %   MCV 106.3 (H) 80.0 - 100.0 fL   MCH 35.0 (H) 26.0 - 34.0 pg   MCHC 32.9 30.0 - 36.0 g/dL   RDW 80.0 (H) 88.4 - 84.4 %   Platelets 216 150 - 400 K/uL   nRBC 0.7 (H) 0.0 - 0.2 %    Comment: Performed at [facility], [address] [place], [place] [address]  Basic metabolic panel     Status: Abnormal   Collection Time: 12/14/23 10:31 AM  Result Value Ref Range   Sodium 135 135 - 145 mmol/L   Potassium 3.5 3.5 - 5.1 mmol/L   Chloride 96 (L) 98 - 111 mmol/L   CO2 22 22 - 32 mmol/L   Glucose, Bld 358 (H) 70 - 99 mg/dL    Comment: Glucose reference range applies only to samples taken after fasting for at least 8 hours.   BUN 10 6 - 20 mg/dL   Creatinine, Ser 8.98 (H) 0.44 - 1.00 mg/dL   Calcium 8.0 (L) 8.9 - 10.3 mg/dL   GFR, Estimated >39 >39 mL/min    Comment: (NOTE) Calculated using the CKD-EPI Creatinine Equation (2021)    Anion gap 17 (H) 5 - 15    Comment: Performed at [facility], [address] [place], [place] [address]  CBC     Status: Abnormal   Collection Time: 12/14/23 10:31 AM  Result Value Ref Range   WBC 6.0 4.0 - 10.5 K/uL   RBC 3.41 (L) 3.87 - 5.11 MIL/uL   Hemoglobin 9.9 (L) 12.0 - 15.0 g/dL   HCT 68.9 (L) 63.9 - 53.9 %   MCV 90.9 80.0 - 100.0 fL   MCH 29.0 26.0 - 34.0 pg   MCHC 31.9 30.0 - 36.0 g/dL   RDW 74.6 (H) 88.4 - 84.4 %   Platelets 207 150 - 400 K/uL   nRBC 0.5 (H) 0.0 - 0.2 %    Comment: Performed at [facility], [address] [place], [place] [address]  Troponin T, High Sensitivity  Status: None   Collection Time: 12/14/23 10:31 AM  Result Value Ref Range   Troponin T High Sensitivity <15 0 - 19 ng/L    Comment: (NOTE) Biotin concentrations > 1000 ng/mL falsely decrease TnT results.  Serial cardiac troponin measurements are suggested.  Refer to the Links section for chest pain algorithms and  additional  guidance. Performed at [facility], [address] [place], [place] [address]   Pregnancy, urine     Status: None   Collection Time: 12/14/23 10:31 AM  Result Value Ref Range   Preg Test, Ur NEGATIVE NEGATIVE    Comment:        THE SENSITIVITY OF THIS METHODOLOGY IS >20 mIU/mL. Performed at [facility], [address] [place], [place] [address]   Urinalysis, Routine w reflex microscopic -Urine, Clean Catch     Status: Abnormal   Collection Time: 12/14/23 10:31 AM  Result Value Ref Range   Color, Urine YELLOW YELLOW   APPearance CLEAR CLEAR   Specific Gravity, Urine 1.015 1.005 - 1.030   pH 7.0 5.0 - 8.0   Glucose, UA >=500 (A) NEGATIVE mg/dL   Hgb urine dipstick LARGE (A) NEGATIVE   Bilirubin Urine NEGATIVE NEGATIVE   Ketones, ur NEGATIVE NEGATIVE mg/dL   Protein, ur NEGATIVE NEGATIVE mg/dL   Nitrite NEGATIVE NEGATIVE   Leukocytes,Ua NEGATIVE NEGATIVE    Comment: Performed at [facility], [address] [place], [place] [address]  Magnesium      Status: None   Collection Time: 12/14/23 10:31 AM  Result Value Ref Range   Magnesium  1.9 1.7 - 2.4 mg/dL    Comment: Performed at [facility], [address] [place], [place] [address]  Urinalysis, Microscopic (reflex)     Status: Abnormal   Collection Time: 12/14/23 10:31 AM  Result Value Ref Range   RBC / HPF 11-20 0 - 5 RBC/hpf   WBC, UA 0-5 0 - 5 WBC/hpf   Bacteria, UA RARE (A) NONE SEEN   Squamous Epithelial / HPF 0-5 0 - 5 /HPF    Comment: Performed at [facility], [address] [place], [place] [address]  POC CBG, ED     Status: Abnormal   Collection Time: 12/14/23 12:10 PM  Result Value Ref Range   Glucose-Capillary 303 (H) 70 - 99 mg/dL    Comment: Glucose reference range applies only to samples taken after fasting for at least 8 hours.  Troponin T, High Sensitivity     Status: None   Collection Time: 12/14/23 12:23 PM  Result Value Ref  Range   Troponin T High Sensitivity <15 0 - 19 ng/L    Comment: (NOTE) Biotin concentrations > 1000 ng/mL falsely decrease TnT results.  Serial cardiac troponin measurements are suggested.  Refer to the Links section for chest pain algorithms and additional  guidance. Performed at [facility], [address] [place], [place] [address]   I-Stat venous blood gas, [facility], [facility], [facility])     Status: Abnormal   Collection Time: 12/14/23 12:34 PM  Result Value Ref Range   pH, Ven 7.458 (H) 7.25 - 7.43   pCO2, Ven 33.8 (L) 44 - 60 mmHg   pO2, Ven 35 32 - 45 mmHg   Bicarbonate 23.9 20.0 - 28.0 mmol/L   TCO2 25 22 - 32 mmol/L   O2 Saturation 72 %   Acid-Base Excess 0.0 0.0 - 2.0  mmol/L   Sodium 136 135 - 145 mmol/L   Potassium 3.2 (L) 3.5 - 5.1 mmol/L   Calcium, Ion 0.98 (L) 1.15 - 1.40 mmol/L   HCT 28.0 (L) 36.0 - 46.0 %   Hemoglobin 9.5 (L) 12.0 - 15.0 g/dL   Collection site IV start    Drawn by Nurse    Sample type VENOUS    Comment NOTIFIED PHYSICIAN   CBG monitoring, ED     Status: Abnormal   Collection Time: 12/14/23  1:30 PM  Result Value Ref Range   Glucose-Capillary 202 (H) 70 - 99 mg/dL    Comment: Glucose reference range applies only to samples taken after fasting for at least 8 hours.  CBG monitoring, ED     Status: Abnormal   Collection Time: 12/23/23  8:16 AM  Result Value Ref Range   Glucose-Capillary 374 (H) 70 - 99 mg/dL    Comment: Glucose reference range applies only to samples taken after fasting for at least 8 hours.  Comprehensive metabolic panel     Status: Abnormal   Collection Time: 12/23/23  8:57 AM  Result Value Ref Range   Sodium 137 135 - 145 mmol/L   Potassium 5.0 3.5 - 5.1 mmol/L   Chloride 91 (L) 98 - 111 mmol/L   CO2 <7 (L) 22 - 32 mmol/L    Comment: Repeated to verify    Glucose, Bld 370 (H) 70 - 99 mg/dL    Comment: Glucose reference range applies only to samples taken after fasting for at least 8 hours.   BUN 10 6 - 20 mg/dL    Creatinine, Ser 8.65 (H) 0.44 - 1.00 mg/dL   Calcium 8.7 (L) 8.9 - 10.3 mg/dL   Total Protein 7.8 6.5 - 8.1 g/dL   Albumin 3.9 3.5 - 5.0 g/dL   AST 79 (H) 15 - 41 U/L   ALT 40 0 - 44 U/L   Alkaline Phosphatase 137 (H) 38 - 126 U/L   Total Bilirubin 1.0 0.0 - 1.2 mg/dL   GFR, Estimated 51 (L) >60 mL/min    Comment: (NOTE) Calculated using the CKD-EPI Creatinine Equation (2021)    Anion gap NOT CALCULATED 5 - 15    Comment: Performed at [facility], [address] [place], [place] [address]  Lipase, blood     Status: Abnormal   Collection Time: 12/23/23  8:57 AM  Result Value Ref Range   Lipase 131 (H) 11 - 51 U/L    Comment: Performed at [facility], [address] [place], [place] [address]  Troponin T, High Sensitivity     Status: None   Collection Time: 12/23/23  8:57 AM  Result Value Ref Range   Troponin T High Sensitivity <15 0 - 19 ng/L    Comment: (NOTE) Biotin concentrations > 1000 ng/mL falsely decrease TnT results.  Serial cardiac troponin measurements are suggested.  Refer to the Links section for chest pain algorithms and additional  guidance. Performed at [facility], [address] [place], [place] [address]   CBC with Differential     Status: Abnormal   Collection Time: 12/23/23  8:57 AM  Result Value Ref Range   WBC 22.5 (H) 4.0 - 10.5 K/uL   RBC 3.73 (L) 3.87 - 5.11 MIL/uL   Hemoglobin 11.2 (L) 12.0 - 15.0 g/dL   HCT 64.1 (L) 63.9 - 53.9 %   MCV 96.0 80.0 - 100.0 fL   MCH  30.0 26.0 - 34.0 pg   MCHC 31.3 30.0 - 36.0 g/dL   RDW 77.3 (H) 88.4 - 84.4 %   Platelets 352 150 - 400 K/uL   nRBC 0.3 (H) 0.0 - 0.2 %   Neutrophils Relative % 82 %   Neutro Abs 18.7 (H) 1.7 - 7.7 K/uL   Lymphocytes Relative 12 %   Lymphs Abs 2.6 0.7 - 4.0 K/uL   Monocytes Relative 3 %   Monocytes Absolute 0.7 0.1 - 1.0 K/uL   Eosinophils Relative 0 %   Eosinophils Absolute 0.0 0.0 - 0.5 K/uL   Basophils Relative 1 %   Basophils Absolute  0.2 (H) 0.0 - 0.1 K/uL   WBC Morphology MORPHOLOGY UNREMARKABLE    Smear Review Normal platelet morphology    Immature Granulocytes 2 %   Abs Immature Granulocytes 0.34 (H) 0.00 - 0.07 K/uL   Polychromasia PRESENT    Stomatocytes PRESENT     Comment: Performed at [facility], [address] [place], [place] [address]  Urinalysis, w/ Reflex to Culture (Infection Suspected) -Urine, Clean Catch     Status: Abnormal   Collection Time: 12/23/23  8:57 AM  Result Value Ref Range   Specimen Source URINE, CLEAN CATCH    Color, Urine YELLOW YELLOW   APPearance CLEAR CLEAR   Specific Gravity, Urine >=1.030 1.005 - 1.030   pH 5.5 5.0 - 8.0   Glucose, UA >=500 (A) NEGATIVE mg/dL   Hgb urine dipstick SMALL (A) NEGATIVE   Bilirubin Urine NEGATIVE NEGATIVE   Ketones, ur 40 (A) NEGATIVE mg/dL   Protein, ur 899 (A) NEGATIVE mg/dL   Nitrite NEGATIVE NEGATIVE   Leukocytes,Ua NEGATIVE NEGATIVE   Squamous Epithelial / HPF 0-5 0 - 5 /HPF   WBC, UA 0-5 0 - 5 WBC/hpf    Comment: Reflex urine culture not performed if WBC <=10, OR if Squamous epithelial cells >5. If Squamous epithelial cells >5, suggest recollection.   RBC / HPF 0-5 0 - 5 RBC/hpf   Bacteria, UA RARE (A) NONE SEEN   Hyaline Casts, UA PRESENT     Comment: Performed at [facility], [address] [place], [place] [address]  Beta-hydroxybutyric acid     Status: Abnormal   Collection Time: 12/23/23  8:57 AM  Result Value Ref Range   Beta-Hydroxybutyric Acid 2.15 (H) 0.05 - 0.27 mmol/L    Comment: Performed at [facility], [address] [place], [place] [address]  Pregnancy serum, qualitative     Status: None   Collection Time: 12/23/23  8:57 AM  Result Value Ref Range   Preg, Serum NEGATIVE NEGATIVE    Comment:        THE SENSITIVITY OF THIS METHODOLOGY IS >10 mIU/mL. Performed at [facility], [address] [place], [place] [address]   Pregnancy, urine     Status: None   Collection Time:  12/23/23  8:57 AM  Result Value Ref Range   Preg Test, Ur NEGATIVE NEGATIVE    Comment:        THE SENSITIVITY OF THIS METHODOLOGY IS >20 mIU/mL. Performed at [facility], [address] [place], [place] [address]   I-Stat venous blood gas, [facility], [facility], [facility])     Status: Abnormal   Collection Time: 12/23/23  9:08 AM  Result Value Ref Range   pH, Ven 7.034 (LL) 7.25 - 7.43   pCO2, Ven 24.5 (L) 44 -  60 mmHg   pO2, Ven 57 (H) 32 - 45 mmHg   Bicarbonate 6.6 (L) 20.0 - 28.0 mmol/L   TCO2 7 (L) 22 - 32 mmol/L   O2 Saturation 77 %   Acid-base deficit 23.0 (H) 0.0 - 2.0 mmol/L   Sodium 135 135 - 145 mmol/L   Potassium 5.0 3.5 - 5.1 mmol/L   Calcium, Ion 1.03 (L) 1.15 - 1.40 mmol/L   HCT 40.0 36.0 - 46.0 %   Hemoglobin 13.6 12.0 - 15.0 g/dL   Patient temperature 02.5 F    Sample type VENOUS    Comment NOTIFIED PHYSICIAN   CBG monitoring, ED     Status: Abnormal   Collection Time: 12/23/23 11:13 AM  Result Value Ref Range   Glucose-Capillary 276 (H) 70 - 99 mg/dL    Comment: Glucose reference range applies only to samples taken after fasting for at least 8 hours.  CBG monitoring, ED     Status: Abnormal   Collection Time: 12/23/23 12:25 PM  Result Value Ref Range   Glucose-Capillary 255 (H) 70 - 99 mg/dL    Comment: Glucose reference range applies only to samples taken after fasting for at least 8 hours.   Comment 1 Notify RN   CBG monitoring, ED     Status: Abnormal   Collection Time: 12/23/23  1:50 PM  Result Value Ref Range   Glucose-Capillary 205 (H) 70 - 99 mg/dL    Comment: Glucose reference range applies only to samples taken after fasting for at least 8 hours.  Basic metabolic panel     Status: Abnormal   Collection Time: 12/23/23  1:51 PM  Result Value Ref Range   Sodium 137 135 - 145 mmol/L    Comment: Electrolytes repeated to verify    Potassium 5.7 (H) 3.5 - 5.1 mmol/L   Chloride 96 (L) 98 - 111 mmol/L   CO2 12 (L) 22 - 32 mmol/L   Glucose, Bld 203 (H)  70 - 99 mg/dL    Comment: Glucose reference range applies only to samples taken after fasting for at least 8 hours.   BUN 8 6 - 20 mg/dL   Creatinine, Ser 8.74 (H) 0.44 - 1.00 mg/dL   Calcium 8.1 (L) 8.9 - 10.3 mg/dL   GFR, Estimated 55 (L) >60 mL/min    Comment: (NOTE) Calculated using the CKD-EPI Creatinine Equation (2021)    Anion gap 29 (H) 5 - 15    Comment: Performed at [facility], [address] [place], [place] [address]  CBG monitoring, ED     Status: Abnormal   Collection Time: 12/23/23  2:53 PM  Result Value Ref Range   Glucose-Capillary 219 (H) 70 - 99 mg/dL    Comment: Glucose reference range applies only to samples taken after fasting for at least 8 hours.   EKG: Showed a sinus tachycardia with a rate of 121 with no evidence of ST elevation or depression my interpretation and patient did have a QTc of 456 ms and low voltage  Assessment and Plan: No notes have been filed under this hospital service. Service: Hospitalist  DKA Type 1 without coma  Elevated Anion gaP Metabolic Acidosis: Admit to the stepdown unit and placed on insulin  drip; unclear etiology but mitigated her DKA as she has no signs or symptoms of infection.  Give her IV fluid hydration and give her another bolus of LR.  Placed on maintenance IV fluid and transition to D5 and LR once  blood sugars are less than 250.  Continue to monitor BMP every 4 as well as beta-hydroxybutyrate acid.  Transition once gap is closed and once CO2 is greater than 20 x 2.  Consult diabetes coordinator and obtain hemoglobin A1c in AM.  Continue supportive care, fluids and antiemetics.  Chest x-ray showed no acute cardiopulmonary findings and UA was fairly unremarkable in terms of infection  AKI: BUN/Cr Trend: Recent Labs  Lab 12/14/23 1031 12/23/23 0857 12/23/23 1351 12/23/23 1611 12/23/23 1702  BUN 10 10 8 9 8   CREATININE 1.01* 1.34* 1.25* 1.09* 0.97  -IVF as above -Avoid Nephrotoxic Medications, Contrast Dyes,  Hypotension and Dehydration to Ensure Adequate Renal Perfusion and will need to Renally Adjust Meds. CTM and Trend Renal Function carefully and repeat CMP in the AM   Leukocytosis: In the setting of dehydration from nausea vomiting.  WBC was 22.5.  Currently no signs or symptoms of infection.  Repeat CBC in the a.m. and hold off on antibiotics at this time  Paroxysmal Atrial Fibrillation: Continue monitor on telemetry.  Resume Multaq  once tolerating oral intake and 1 electrolytes correct.  Anticoagulation with apixaban  and continue with metoprolol  to tartrate 50 mg p.o. twice daily  Chest discomfort: In the setting of nausea vomiting.  Unlikely ACS and EKG showed sinus tachycardia rate 121 with a QTc of 456 ms and low voltage with no ST elevation or depression on my interpretation.  Troponin T high-sensitivity was less than 15.  Continue to monitor on telemetry in the aggressive unit  Depression and Anxiety: Continue with buspirone 50 mg p.o. 3 times daily and resume desvenlafaxine once electrolytes are corrected and tolerating oral intake in the morning  Class II Obesity: Complicates overall prognosis and care. Estimated body mass index is 39.68 kg/m as calculated from the following:   Height as of this encounter: 5' 3 (1.6 m).   Weight as of this encounter: 101.6 kg. Weight Loss and Dietary Counseling given  Advance Care Planning:   Code Status: Prior FULL  Consults: None  Family Communication: No family present @ bedside  Severity of Illness: The appropriate patient status for this patient is OBSERVATION. Observation status is judged to be reasonable and necessary in order to provide the required intensity of service to ensure the patient's safety. The patient's presenting symptoms, physical exam findings, and initial radiographic and laboratory data in the context of their medical condition is felt to place them at decreased risk for further clinical deterioration. Furthermore, it is  anticipated that the patient will be medically stable for discharge from the hospital within 2 midnights of admission.   Author: Alejandro Lazarus Marker, DO 12/23/2023 3:51 PM  For on call review [email or web address].

## 2023-12-23 NOTE — Progress Notes (Signed)
 Plan of Care Note for accepted transfer   Patient: Katie Hampton MRN: [id number]   DOA: 12/23/2023  Facility requesting transfer: [facility].  Requesting Provider: Fonda Law, MD. Reason for transfer: DKA. Facility course:  43 year old female with a past medical history of class II obesity, hyperlipidemia, B12 and folate deficiency, history of pyelonephritis, paroxysmal atrial fibrillation, type 1 diabetes, peripheral neuropathy, prolonged QT interval who presented to the emergency department with complaints of abdominal pain, nausea, emesis, chest pain and hyperglycemia since the previous night.  Plan of care: The patient is accepted for admission to Stepdown unit, at [facility].  The patient has received IV fluids and was started on an insulin  infusion.  Author: Alm Dorn Castor, MD 12/23/2023  Check [email or web address] for on-call coverage.  Nursing staff, Please call [facility] Admits & Consults [facility]-Wide number on Amion as soon as patient's arrival, so appropriate admitting provider can evaluate the pt.

## 2023-12-23 NOTE — ED Notes (Signed)
 Panic VBG results to Dr  Darra at 09:11.

## 2023-12-23 NOTE — ED Provider Notes (Signed)
 Emergency Department Provider Note   I have reviewed the triage vital signs and the nursing notes.   HISTORY  Chief Complaint No chief complaint on file.   HPI Katie Hampton is a 43 y.o. female past history of A-fib and diabetes presents to the emergency department for evaluation of vomiting with shortness of breath and chest tightness.  Symptoms began last night.  She states that she has not been in DKA for a Nathanial Arrighi time but this feels somewhat similar.  She has had some mild diarrhea but no abdominal pain.  Emesis is nonbloody.  No fevers.  She is compliant with her home medications including her insulin  and her anticoagulant.  Past Medical History:  Diagnosis Date   AF (paroxysmal atrial fibrillation) (HCC)    Diabetes mellitus    Type 1   Pyelonephritis     Review of Systems  Constitutional: No fever/chills.  Cardiovascular: Positive chest pain. Respiratory: Positive shortness of breath. Gastrointestinal: No abdominal pain. Positive nausea, vomiting, and diarrhea.  No constipation. Skin: Negative for rash. Neurological: Negative for headaches.  ____________________________________________   PHYSICAL EXAM:  VITAL SIGNS: ED Triage Vitals  Encounter Vitals Group     BP 12/23/23 0811 (!) 173/105     Pulse Rate 12/23/23 0811 (!) 124     Resp 12/23/23 0811 (!) 22     Temp 12/23/23 0812 (!) 97.4 F (36.3 C)     Temp Source 12/23/23 0812 Oral     SpO2 12/23/23 0811 99 %     Weight 12/23/23 0810 223 lb 15.8 oz (101.6 kg)     Height 12/23/23 0809 5' 3 (1.6 m)   Constitutional: Alert and oriented. Well appearing and in no acute distress. Eyes: Conjunctivae are normal.  Head: Atraumatic. Nose: No congestion/rhinnorhea. Mouth/Throat: Mucous membranes are moist.   Neck: No stridor.   Cardiovascular: Tachycardia. Good peripheral circulation. Grossly normal heart sounds.   Respiratory: Increased respiratory effort.  No retractions. Lungs CTAB. Gastrointestinal:  Soft and nontender. No distention.  Musculoskeletal: No gross deformities of extremities. Neurologic:  Normal speech and language. Skin:  Skin is warm, dry and intact. No rash noted.   ____________________________________________   LABS (all labs ordered are listed, but only abnormal results are displayed)  Labs Reviewed  CBG MONITORING, ED - Abnormal; Notable for the following components:      Result Value   Glucose-Capillary 374 (*)    All other components within normal limits  COMPREHENSIVE METABOLIC PANEL WITH GFR  LIPASE, BLOOD  CBC WITH DIFFERENTIAL/PLATELET  URINALYSIS, W/ REFLEX TO CULTURE (INFECTION SUSPECTED)  BETA-HYDROXYBUTYRIC ACID  HCG, SERUM, QUALITATIVE  I-STAT VENOUS BLOOD GAS, ED  TROPONIN T, HIGH SENSITIVITY   ____________________________________________  EKG   EKG Interpretation Date/Time:  Tuesday December 23 2023 08:11:33 EDT Ventricular Rate:  121 PR Interval:  167 QRS Duration:  90 QT Interval:  321 QTC Calculation: 456 R Axis:   38  Text Interpretation: Sinus tachycardia Low voltage, precordial leads Confirmed by Darra Chew [phone]) on 12/23/2023 8:20:30 AM        ____________________________________________  RADIOLOGY  No results found.  ____________________________________________   PROCEDURES  Procedure(s) performed:   Procedures   ____________________________________________   INITIAL IMPRESSION / ASSESSMENT AND PLAN / ED COURSE  Pertinent labs & imaging results that were available during my care of the patient were reviewed by me and considered in my medical decision making (see chart for details).   This patient is Presenting for Evaluation of CP, which does  require a range of treatment options, and is a complaint that involves a high risk of morbidity and mortality.  The Differential Diagnoses includes but is not exclusive to acute coronary syndrome, DKA, aortic dissection, pulmonary embolism, cardiac tamponade,  community-acquired pneumonia, pericarditis, musculoskeletal chest wall pain, etc.   Critical Interventions-    Medications  sodium chloride  0.9 % bolus 1,000 mL (has no administration in time range)    Reassessment after intervention: HR improved.    I did obtain Additional Historical Information from husband at bedside.   I decided to review pertinent External Data, and in summary patient with history of IDDM and Afib on anticoagulation.   Clinical Laboratory Tests Ordered, included CBG  of 375 on arrival. ***  Radiologic Tests Ordered, included CXR. I independently interpreted the images and agree with radiology interpretation.   Cardiac Monitor Tracing which shows sinus tachycardia.    Social Determinants of Health Risk patient is not an active smoker.   Consult complete with  Medical Decision Making: Summary:  The patient presents emergency department with shortness of breath and chest pain.  Arrives with sinus tachycardia.  Doubt sepsis clinically.  No infection signs or symptoms.  Some concern for DKA.  Plan to start with IV fluids and obtain screening blood work.  Patient is not acutely volume overloaded that I would expect in decompensated CHF.  PE much less likely as the patient is compliant with her anticoagulant and chest pain is atypical for PE.  Reevaluation with update and discussion with   ***Considered admission***  Patient's presentation is most consistent with acute presentation with potential threat to life or bodily function.   Disposition:   ____________________________________________  FINAL CLINICAL IMPRESSION(S) / ED DIAGNOSES  Final diagnoses:  None     NEW OUTPATIENT MEDICATIONS STARTED DURING THIS VISIT:  New Prescriptions   No medications on file    Note:  This document was prepared using Dragon voice recognition software and may include unintentional dictation errors.  Fonda Law, MD, [facility]

## 2023-12-24 DIAGNOSIS — E1110 Type 2 diabetes mellitus with ketoacidosis without coma: Secondary | ICD-10-CM | POA: Diagnosis present

## 2023-12-24 DIAGNOSIS — Z6839 Body mass index (BMI) 39.0-39.9, adult: Secondary | ICD-10-CM | POA: Diagnosis not present

## 2023-12-24 DIAGNOSIS — E1042 Type 1 diabetes mellitus with diabetic polyneuropathy: Secondary | ICD-10-CM | POA: Diagnosis present

## 2023-12-24 DIAGNOSIS — R112 Nausea with vomiting, unspecified: Secondary | ICD-10-CM

## 2023-12-24 DIAGNOSIS — D72829 Elevated white blood cell count, unspecified: Secondary | ICD-10-CM | POA: Diagnosis present

## 2023-12-24 DIAGNOSIS — Z794 Long term (current) use of insulin: Secondary | ICD-10-CM | POA: Diagnosis not present

## 2023-12-24 DIAGNOSIS — N179 Acute kidney failure, unspecified: Secondary | ICD-10-CM | POA: Diagnosis present

## 2023-12-24 DIAGNOSIS — Z7901 Long term (current) use of anticoagulants: Secondary | ICD-10-CM | POA: Diagnosis not present

## 2023-12-24 DIAGNOSIS — F10930 Alcohol use, unspecified with withdrawal, uncomplicated: Secondary | ICD-10-CM | POA: Diagnosis not present

## 2023-12-24 DIAGNOSIS — E109 Type 1 diabetes mellitus without complications: Secondary | ICD-10-CM | POA: Diagnosis not present

## 2023-12-24 DIAGNOSIS — R197 Diarrhea, unspecified: Secondary | ICD-10-CM

## 2023-12-24 DIAGNOSIS — F439 Reaction to severe stress, unspecified: Secondary | ICD-10-CM | POA: Diagnosis present

## 2023-12-24 DIAGNOSIS — I480 Paroxysmal atrial fibrillation: Secondary | ICD-10-CM | POA: Diagnosis present

## 2023-12-24 DIAGNOSIS — E1010 Type 1 diabetes mellitus with ketoacidosis without coma: Secondary | ICD-10-CM | POA: Diagnosis present

## 2023-12-24 DIAGNOSIS — F10939 Alcohol use, unspecified with withdrawal, unspecified: Secondary | ICD-10-CM | POA: Diagnosis present

## 2023-12-24 DIAGNOSIS — F419 Anxiety disorder, unspecified: Secondary | ICD-10-CM | POA: Diagnosis present

## 2023-12-24 DIAGNOSIS — F32A Depression, unspecified: Secondary | ICD-10-CM | POA: Diagnosis present

## 2023-12-24 DIAGNOSIS — Z87891 Personal history of nicotine dependence: Secondary | ICD-10-CM | POA: Diagnosis not present

## 2023-12-24 DIAGNOSIS — E785 Hyperlipidemia, unspecified: Secondary | ICD-10-CM | POA: Diagnosis present

## 2023-12-24 DIAGNOSIS — Z880 Allergy status to penicillin: Secondary | ICD-10-CM | POA: Diagnosis not present

## 2023-12-24 DIAGNOSIS — E66812 Obesity, class 2: Secondary | ICD-10-CM | POA: Diagnosis present

## 2023-12-24 LAB — BASIC METABOLIC PANEL WITH GFR
Anion gap: 15 (ref 5–15)
BUN: 7 mg/dL (ref 6–20)
CO2: 22 mmol/L (ref 22–32)
Calcium: 8.2 mg/dL — ABNORMAL LOW (ref 8.9–10.3)
Chloride: 101 mmol/L (ref 98–111)
Creatinine, Ser: 0.98 mg/dL (ref 0.44–1.00)
GFR, Estimated: >60 mL/min (ref >60)
Glucose, Bld: 164 mg/dL — ABNORMAL HIGH (ref 70–99)
Potassium: 3.9 mmol/L (ref 3.5–5.1)
Sodium: 137 mmol/L (ref 135–145)

## 2023-12-24 LAB — CBC
HCT: 28.3 % — ABNORMAL LOW (ref 36.0–46.0)
Hemoglobin: 8.5 g/dL — ABNORMAL LOW (ref 12.0–15.0)
MCH: 30.1 pg (ref 26.0–34.0)
MCHC: 30 g/dL (ref 30.0–36.0)
MCV: 100.4 fL — ABNORMAL HIGH (ref 80.0–100.0)
Platelets: 135 K/uL — ABNORMAL LOW (ref 150–400)
RBC: 2.82 MIL/uL — ABNORMAL LOW (ref 3.87–5.11)
RDW: 22 % — ABNORMAL HIGH (ref 11.5–15.5)
WBC: 13.6 K/uL — ABNORMAL HIGH (ref 4.0–10.5)
nRBC: 0.3 % — ABNORMAL HIGH (ref 0.0–0.2)

## 2023-12-24 LAB — HEMOGLOBIN A1C
Hgb A1c MFr Bld: 7.9 % — ABNORMAL HIGH (ref 4.8–5.6)
Mean Plasma Glucose: 180.03 mg/dL

## 2023-12-24 LAB — GLUCOSE, CAPILLARY
Glucose-Capillary: 111 mg/dL — ABNORMAL HIGH (ref 70–99)
Glucose-Capillary: 181 mg/dL — ABNORMAL HIGH (ref 70–99)
Glucose-Capillary: 204 mg/dL — ABNORMAL HIGH (ref 70–99)
Glucose-Capillary: 223 mg/dL — ABNORMAL HIGH (ref 70–99)
Glucose-Capillary: 235 mg/dL — ABNORMAL HIGH (ref 70–99)
Glucose-Capillary: 298 mg/dL — ABNORMAL HIGH (ref 70–99)

## 2023-12-24 LAB — C DIFFICILE QUICK SCREEN W PCR REFLEX
C Diff antigen: POSITIVE — AB
C Diff toxin: NEGATIVE

## 2023-12-24 LAB — CLOSTRIDIUM DIFFICILE BY PCR, REFLEXED
Hypervirulent Strain: NEGATIVE
Toxigenic C. Difficile by PCR: NEGATIVE

## 2023-12-24 LAB — BETA-HYDROXYBUTYRIC ACID
Beta-Hydroxybutyric Acid: 0.47 mmol/L — ABNORMAL HIGH (ref 0.05–0.27)
Beta-Hydroxybutyric Acid: 0.48 mmol/L — ABNORMAL HIGH (ref 0.05–0.27)

## 2023-12-24 MED ORDER — INSULIN ASPART 100 UNIT/ML IJ SOLN
4.0000 [IU] | Freq: Two times a day (BID) | INTRAMUSCULAR | Status: DC
  Administered 2023-12-24 – 2023-12-25 (×2): 4 [IU] via SUBCUTANEOUS

## 2023-12-24 MED ORDER — ONDANSETRON HCL 4 MG/2ML IJ SOLN
4.0000 mg | Freq: Once | INTRAMUSCULAR | Status: AC
  Administered 2023-12-24: 4 mg via INTRAVENOUS
  Filled 2023-12-24: qty 2

## 2023-12-24 MED ORDER — ALBUTEROL-BUDESONIDE 90-80 MCG/ACT IN AERO
2.0000 | INHALATION_SPRAY | RESPIRATORY_TRACT | Status: DC | PRN
Start: 2023-12-24 — End: 2023-12-24

## 2023-12-24 MED ORDER — DRONEDARONE HCL 400 MG PO TABS
400.0000 mg | ORAL_TABLET | Freq: Two times a day (BID) | ORAL | Status: DC
  Administered 2023-12-24 – 2023-12-25 (×2): 400 mg via ORAL
  Filled 2023-12-24 (×2): qty 1

## 2023-12-24 MED ORDER — INSULIN ASPART 100 UNIT/ML IJ SOLN: 0.0000 [IU] | Freq: Every day | INTRAMUSCULAR | Status: DC

## 2023-12-24 MED ORDER — INSULIN NPH (HUMAN) (ISOPHANE) 100 UNIT/ML ~~LOC~~ SUSP
10.0000 [IU] | Freq: Every day | SUBCUTANEOUS | Status: DC
  Administered 2023-12-24: 10 [IU] via SUBCUTANEOUS
  Filled 2023-12-24: qty 10

## 2023-12-24 MED ORDER — ALBUTEROL SULFATE (2.5 MG/3ML) 0.083% IN NEBU: 2.5000 mg | INHALATION_SOLUTION | Freq: Every day | RESPIRATORY_TRACT | Status: DC | PRN

## 2023-12-24 MED ORDER — INSULIN ASPART 100 UNIT/ML IJ SOLN
0.0000 [IU] | Freq: Three times a day (TID) | INTRAMUSCULAR | Status: DC
  Administered 2023-12-24 (×2): 7 [IU] via SUBCUTANEOUS
  Administered 2023-12-24: 11 [IU] via SUBCUTANEOUS
  Administered 2023-12-25: 7 [IU] via SUBCUTANEOUS

## 2023-12-24 MED ORDER — INSULIN GLARGINE-YFGN 100 UNIT/ML ~~LOC~~ SOLN: 5.0000 [IU] | Freq: Once | SUBCUTANEOUS | Status: DC

## 2023-12-24 MED ORDER — BUDESONIDE 0.25 MG/2ML IN SUSP: 0.2500 mg | Freq: Every day | RESPIRATORY_TRACT | Status: DC | PRN

## 2023-12-24 MED ORDER — LACTATED RINGERS IV SOLN: INTRAVENOUS | Status: DC

## 2023-12-24 MED ORDER — INSULIN GLARGINE 100 UNIT/ML ~~LOC~~ SOLN
5.0000 [IU] | Freq: Once | SUBCUTANEOUS | Status: AC
  Administered 2023-12-24: 5 [IU] via SUBCUTANEOUS
  Filled 2023-12-24: qty 0.05

## 2023-12-24 MED ORDER — INSULIN NPH (HUMAN) (ISOPHANE) 100 UNIT/ML ~~LOC~~ SUSP
10.0000 [IU] | Freq: Two times a day (BID) | SUBCUTANEOUS | Status: DC
Start: 2023-12-24 — End: 2023-12-24

## 2023-12-24 MED ORDER — INSULIN NPH (HUMAN) (ISOPHANE) 100 UNIT/ML ~~LOC~~ SUSP
10.0000 [IU] | Freq: Every day | SUBCUTANEOUS | Status: DC
  Administered 2023-12-25: 10 [IU] via SUBCUTANEOUS

## 2023-12-24 NOTE — Progress Notes (Addendum)
 Progress Note   Patient: Katie Hampton FMW:[id number] DOB: [birth year] DOA: 12/23/2023     0 DOS: the patient was seen and examined on 12/24/2023   Brief hospital course: 43 year old woman PMH including diabetes mellitus type 1 on insulin , peripheral neuropathy, obesity, B12 and folate deficiency, prolonged QT, presented with abdominal pain, nausea, vomiting, diarrhea.  Admitted for DKA.  Consultants None   Procedures/Events 10/7 admit for DKA  Assessment and Plan: DKA  Diabetes mellitus type 1 on insulin  Admission CO2 was less than 7, anion gap not calculated, with beta hydroxybutyric acid 2.15 Anion gap now within normal limits, CO2 is normalized.  Beta hydroxybutyric acid minimally elevated. DKA resolved.  Resume subcutaneous insulin , continue sliding scale.  Consult diabetes coordinator.  Nausea, vomiting, diarrhea Probable gastroenteritis C. difficile screen indeterminate with positive antigen and negative toxin.  PCR pending.  History and exam argues against C. difficile.  Leukocytosis was probably stress reaction.  AKI -- resolved Baseline creatinine around 0.8. Peak creatinine 1.34. Now near baseline. No further evaluation suggested.  Acute alcohol withdrawal On CIWA per EDP, continued by admitting physician.  EDP note not complete, not noted on H&P. This appears to be mild in nature.  Continue CIWA.  Leukocytosis secondary to acute illness Likely stress reaction  Paroxysmal atrial fibrillation Resume Multaq , metoprolol  and apixaban   Atypical chest pain secondary to vomiting EKG reassuring, sinus tach, no acute changes, troponins negative.  ACS ruled out.  Depression and anxiety Continue buspirone, desvenlafaxine Hold alprazolam while on CIWA  Class II obesity Body mass index is 38.82 kg/m.      Subjective:  Still does not feel very well, nauseous, several episodes of vomiting and several episodes of diarrhea.  No history of C. difficile.  No recent  antibiotics.  No blood.  Has mild abdominal pain.  Drinks alcohol most every day, care, shots, can drink quite a bit.  Discussed with nursing, mild alcohol withdrawal.  Physical Exam: Vitals:   12/24/23 0615 12/24/23 0700 12/24/23 0800 12/24/23 0900  BP: (!) 169/95 125/62 (!) 147/86 (!) 156/87  Pulse: (!) 103 98 86 75  Resp:  (!) 21 17 14   Temp:   97.8 F (36.6 C)   TempSrc:   Oral   SpO2:  98% 98% 99%  Weight:      Height:       Physical Exam Vitals reviewed.  Constitutional:      General: She is not in acute distress.    Appearance: She is not ill-appearing or toxic-appearing.  Cardiovascular:     Rate and Rhythm: Normal rate and regular rhythm.     Heart sounds: No murmur heard.    Comments: Telemetry sinus rhythm Pulmonary:     Effort: Pulmonary effort is normal. No respiratory distress.     Breath sounds: No wheezing, rhonchi or rales.  Abdominal:     General: There is no distension.     Palpations: Abdomen is soft.     Tenderness: There is no abdominal tenderness. There is no guarding.  Musculoskeletal:     Right lower leg: No edema.     Left lower leg: No edema.  Neurological:     Mental Status: She is alert.  Psychiatric:        Mood and Affect: Mood normal.        Behavior: Behavior normal.     Data Reviewed: CBG overall stable with 1 high this morning Anion gap now within normal limits, CO2 is normalized.  Beta hydroxybutyric acid minimally  elevated. C. difficile screen is equivocal, await PCR  Family Communication: none   Disposition: Status is: Observation      Time spent: 35 minutes  Author: Toribio Door, MD 12/24/2023 9:23 AM  For on call review [email or web address].

## 2023-12-24 NOTE — Plan of Care (Signed)
  Problem: Clinical Measurements: Goal: Ability to maintain clinical measurements within normal limits will improve Outcome: Progressing Goal: Will remain free from infection Outcome: Progressing Goal: Diagnostic test results will improve Outcome: Progressing Goal: Respiratory complications will improve Outcome: Progressing Goal: Cardiovascular complication will be avoided Outcome: Progressing   Problem: Activity: Goal: Risk for activity intolerance will decrease Outcome: Progressing   Problem: Nutrition: Goal: Adequate nutrition will be maintained Outcome: Progressing   Problem: Coping: Goal: Level of anxiety will decrease Outcome: Progressing   Problem: Elimination: Goal: Will not experience complications related to urinary retention Outcome: Progressing   Problem: Pain Managment: Goal: General experience of comfort will improve and/or be controlled Outcome: Progressing   Problem: Elimination: Goal: Will not experience complications related to bowel motility Outcome: Not Progressing

## 2023-12-24 NOTE — Plan of Care (Signed)
  Problem: Health Behavior/Discharge Planning: Goal: Ability to manage health-related needs will improve Outcome: Progressing   Problem: Clinical Measurements: Goal: Diagnostic test results will improve Outcome: Progressing   Problem: Activity: Goal: Risk for activity intolerance will decrease Outcome: Progressing   Problem: Nutrition: Goal: Adequate nutrition will be maintained Outcome: Progressing   Problem: Coping: Goal: Level of anxiety will decrease Outcome: Progressing   

## 2023-12-24 NOTE — Plan of Care (Signed)

## 2023-12-24 NOTE — Hospital Course (Signed)
 43 year old woman PMH including diabetes mellitus type 1 on insulin , peripheral neuropathy, obesity, B12 and folate deficiency, prolonged QT, presented with abdominal pain, nausea, vomiting, diarrhea.  Admitted for DKA.  Consultants None   Procedures/Events 10/7 admit for DKA

## 2023-12-25 DIAGNOSIS — E109 Type 1 diabetes mellitus without complications: Secondary | ICD-10-CM | POA: Diagnosis not present

## 2023-12-25 DIAGNOSIS — E1010 Type 1 diabetes mellitus with ketoacidosis without coma: Secondary | ICD-10-CM | POA: Diagnosis not present

## 2023-12-25 DIAGNOSIS — I480 Paroxysmal atrial fibrillation: Secondary | ICD-10-CM | POA: Diagnosis not present

## 2023-12-25 DIAGNOSIS — F10930 Alcohol use, unspecified with withdrawal, uncomplicated: Secondary | ICD-10-CM | POA: Diagnosis not present

## 2023-12-25 LAB — BASIC METABOLIC PANEL WITH GFR
Anion gap: 11 (ref 5–15)
BUN: 8 mg/dL (ref 6–20)
CO2: 25 mmol/L (ref 22–32)
Calcium: 8.9 mg/dL (ref 8.9–10.3)
Chloride: 100 mmol/L (ref 98–111)
Creatinine, Ser: 0.95 mg/dL (ref 0.44–1.00)
GFR, Estimated: >60 mL/min (ref >60)
Glucose, Bld: 251 mg/dL — ABNORMAL HIGH (ref 70–99)
Potassium: 3.8 mmol/L (ref 3.5–5.1)
Sodium: 136 mmol/L (ref 135–145)

## 2023-12-25 LAB — CBC
HCT: 32.7 % — ABNORMAL LOW (ref 36.0–46.0)
Hemoglobin: 9.5 g/dL — ABNORMAL LOW (ref 12.0–15.0)
MCH: 29.9 pg (ref 26.0–34.0)
MCHC: 29.1 g/dL — ABNORMAL LOW (ref 30.0–36.0)
MCV: 102.8 fL — ABNORMAL HIGH (ref 80.0–100.0)
Platelets: 158 K/uL (ref 150–400)
RBC: 3.18 MIL/uL — ABNORMAL LOW (ref 3.87–5.11)
RDW: 21.9 % — ABNORMAL HIGH (ref 11.5–15.5)
WBC: 13.9 K/uL — ABNORMAL HIGH (ref 4.0–10.5)
nRBC: 0.5 % — ABNORMAL HIGH (ref 0.0–0.2)

## 2023-12-25 LAB — GLUCOSE, CAPILLARY: Glucose-Capillary: 249 mg/dL — ABNORMAL HIGH (ref 70–99)

## 2023-12-25 NOTE — Plan of Care (Signed)
 Problem: Education: Goal: Knowledge of General Education information will improve Description: Including pain rating scale, medication(s)/side effects and non-pharmacologic comfort measures 12/25/2023 0843 by Gayle Laymon CROME, RN Outcome: Adequate for Discharge 12/25/2023 0843 by Gayle Laymon CROME, RN Outcome: Adequate for Discharge   Problem: Health Behavior/Discharge Planning: Goal: Ability to manage health-related needs will improve 12/25/2023 0843 by Gayle Laymon CROME, RN Outcome: Adequate for Discharge 12/25/2023 0843 by Gayle Laymon CROME, RN Outcome: Adequate for Discharge   Problem: Clinical Measurements: Goal: Ability to maintain clinical measurements within normal limits will improve 12/25/2023 0843 by Gayle Laymon CROME, RN Outcome: Adequate for Discharge 12/25/2023 0843 by Gayle Laymon CROME, RN Outcome: Adequate for Discharge Goal: Will remain free from infection 12/25/2023 0843 by Gayle Laymon CROME, RN Outcome: Adequate for Discharge 12/25/2023 0843 by Gayle Laymon CROME, RN Outcome: Adequate for Discharge Goal: Diagnostic test results will improve 12/25/2023 0843 by Gayle Laymon CROME, RN Outcome: Adequate for Discharge 12/25/2023 0843 by Gayle Laymon CROME, RN Outcome: Adequate for Discharge Goal: Respiratory complications will improve 12/25/2023 0843 by Gayle Laymon CROME, RN Outcome: Adequate for Discharge 12/25/2023 0843 by Gayle Laymon CROME, RN Outcome: Adequate for Discharge Goal: Cardiovascular complication will be avoided 12/25/2023 0843 by Gayle Laymon CROME, RN Outcome: Adequate for Discharge 12/25/2023 0843 by Gayle Laymon CROME, RN Outcome: Adequate for Discharge   Problem: Activity: Goal: Risk for activity intolerance will decrease 12/25/2023 0843 by Gayle Laymon CROME, RN Outcome: Adequate for Discharge 12/25/2023 0843 by Gayle Laymon CROME, RN Outcome: Adequate for Discharge   Problem: Nutrition: Goal: Adequate nutrition will be maintained 12/25/2023 0843 by  Gayle Laymon CROME, RN Outcome: Adequate for Discharge 12/25/2023 0843 by Gayle Laymon CROME, RN Outcome: Adequate for Discharge   Problem: Coping: Goal: Level of anxiety will decrease 12/25/2023 0843 by Gayle Laymon CROME, RN Outcome: Adequate for Discharge 12/25/2023 0843 by Gayle Laymon CROME, RN Outcome: Adequate for Discharge   Problem: Elimination: Goal: Will not experience complications related to bowel motility 12/25/2023 0843 by Gayle Laymon CROME, RN Outcome: Adequate for Discharge 12/25/2023 0843 by Gayle Laymon CROME, RN Outcome: Adequate for Discharge Goal: Will not experience complications related to urinary retention 12/25/2023 0843 by Gayle Laymon CROME, RN Outcome: Adequate for Discharge 12/25/2023 0843 by Gayle Laymon CROME, RN Outcome: Adequate for Discharge   Problem: Pain Managment: Goal: General experience of comfort will improve and/or be controlled 12/25/2023 0843 by Gayle Laymon CROME, RN Outcome: Adequate for Discharge 12/25/2023 0843 by Gayle Laymon CROME, RN Outcome: Adequate for Discharge   Problem: Safety: Goal: Ability to remain free from injury will improve 12/25/2023 0843 by Gayle Laymon CROME, RN Outcome: Adequate for Discharge 12/25/2023 0843 by Gayle Laymon CROME, RN Outcome: Adequate for Discharge   Problem: Skin Integrity: Goal: Risk for impaired skin integrity will decrease 12/25/2023 0843 by Gayle Laymon CROME, RN Outcome: Adequate for Discharge 12/25/2023 0843 by Gayle Laymon CROME, RN Outcome: Adequate for Discharge   Problem: Education: Goal: Ability to describe self-care measures that may prevent or decrease complications (Diabetes Survival Skills Education) will improve 12/25/2023 0843 by Gayle Laymon CROME, RN Outcome: Adequate for Discharge 12/25/2023 0843 by Gayle Laymon CROME, RN Outcome: Adequate for Discharge Goal: Individualized Educational Video(s) 12/25/2023 0843 by Gayle Laymon CROME, RN Outcome: Adequate for Discharge 12/25/2023 0843 by  Gayle Laymon CROME, RN Outcome: Adequate for Discharge   Problem: Coping: Goal: Ability to adjust to condition or change in health will improve 12/25/2023 0843 by Gayle Laymon CROME, RN Outcome: Adequate for Discharge 12/25/2023 [phone] by  Gayle Laymon CROME, RN Outcome: Adequate for Discharge   Problem: Fluid Volume: Goal: Ability to maintain a balanced intake and output will improve 12/25/2023 0843 by Gayle Laymon CROME, RN Outcome: Adequate for Discharge 12/25/2023 0843 by Gayle Laymon CROME, RN Outcome: Adequate for Discharge   Problem: Health Behavior/Discharge Planning: Goal: Ability to identify and utilize available resources and services will improve 12/25/2023 0843 by Gayle Laymon CROME, RN Outcome: Adequate for Discharge 12/25/2023 0843 by Gayle Laymon CROME, RN Outcome: Adequate for Discharge Goal: Ability to manage health-related needs will improve 12/25/2023 0843 by Gayle Laymon CROME, RN Outcome: Adequate for Discharge 12/25/2023 0843 by Gayle Laymon CROME, RN Outcome: Adequate for Discharge   Problem: Metabolic: Goal: Ability to maintain appropriate glucose levels will improve 12/25/2023 0843 by Gayle Laymon CROME, RN Outcome: Adequate for Discharge 12/25/2023 0843 by Gayle Laymon CROME, RN Outcome: Adequate for Discharge   Problem: Nutritional: Goal: Maintenance of adequate nutrition will improve 12/25/2023 0843 by Gayle Laymon CROME, RN Outcome: Adequate for Discharge 12/25/2023 0843 by Gayle Laymon CROME, RN Outcome: Adequate for Discharge Goal: Progress toward achieving an optimal weight will improve 12/25/2023 0843 by Gayle Laymon CROME, RN Outcome: Adequate for Discharge 12/25/2023 0843 by Gayle Laymon CROME, RN Outcome: Adequate for Discharge   Problem: Skin Integrity: Goal: Risk for impaired skin integrity will decrease 12/25/2023 0843 by Gayle Laymon CROME, RN Outcome: Adequate for Discharge 12/25/2023 0843 by Gayle Laymon CROME, RN Outcome: Adequate for Discharge    Problem: Tissue Perfusion: Goal: Adequacy of tissue perfusion will improve 12/25/2023 0843 by Gayle Laymon CROME, RN Outcome: Adequate for Discharge 12/25/2023 0843 by Gayle Laymon CROME, RN Outcome: Adequate for Discharge   Problem: Education: Goal: Ability to describe self-care measures that may prevent or decrease complications (Diabetes Survival Skills Education) will improve 12/25/2023 0843 by Gayle Laymon CROME, RN Outcome: Adequate for Discharge 12/25/2023 0843 by Gayle Laymon CROME, RN Outcome: Adequate for Discharge Goal: Individualized Educational Video(s) 12/25/2023 0843 by Gayle Laymon CROME, RN Outcome: Adequate for Discharge 12/25/2023 0843 by Gayle Laymon CROME, RN Outcome: Adequate for Discharge   Problem: Cardiac: Goal: Ability to maintain an adequate cardiac output will improve 12/25/2023 0843 by Gayle Laymon CROME, RN Outcome: Adequate for Discharge 12/25/2023 0843 by Gayle Laymon CROME, RN Outcome: Adequate for Discharge   Problem: Health Behavior/Discharge Planning: Goal: Ability to identify and utilize available resources and services will improve 12/25/2023 0843 by Gayle Laymon CROME, RN Outcome: Adequate for Discharge 12/25/2023 0843 by Gayle Laymon CROME, RN Outcome: Adequate for Discharge Goal: Ability to manage health-related needs will improve 12/25/2023 0843 by Gayle Laymon CROME, RN Outcome: Adequate for Discharge 12/25/2023 0843 by Gayle Laymon CROME, RN Outcome: Adequate for Discharge   Problem: Fluid Volume: Goal: Ability to achieve a balanced intake and output will improve 12/25/2023 0843 by Gayle Laymon CROME, RN Outcome: Adequate for Discharge 12/25/2023 0843 by Gayle Laymon CROME, RN Outcome: Adequate for Discharge   Problem: Metabolic: Goal: Ability to maintain appropriate glucose levels will improve 12/25/2023 0843 by Gayle Laymon CROME, RN Outcome: Adequate for Discharge 12/25/2023 0843 by Gayle Laymon CROME, RN Outcome: Adequate for Discharge   Problem:  Nutritional: Goal: Maintenance of adequate nutrition will improve 12/25/2023 0843 by Gayle Laymon CROME, RN Outcome: Adequate for Discharge 12/25/2023 0843 by Gayle Laymon CROME, RN Outcome: Adequate for Discharge Goal: Maintenance of adequate weight for body size and type will improve 12/25/2023 0843 by Gayle Laymon CROME, RN Outcome: Adequate for Discharge 12/25/2023 0843 by Gayle Laymon CROME, RN Outcome: Adequate  for Discharge   Problem: Respiratory: Goal: Will regain and/or maintain adequate ventilation 12/25/2023 0843 by Gayle Laymon CROME, RN Outcome: Adequate for Discharge 12/25/2023 0843 by Gayle Laymon CROME, RN Outcome: Adequate for Discharge   Problem: Urinary Elimination: Goal: Ability to achieve and maintain adequate renal perfusion and functioning will improve 12/25/2023 0843 by Gayle Laymon CROME, RN Outcome: Adequate for Discharge 12/25/2023 0843 by Gayle Laymon CROME, RN Outcome: Adequate for Discharge

## 2023-12-25 NOTE — Discharge Summary (Addendum)
 Physician Discharge Summary   Patient: Katie Hampton MRN: [id number] DOB: November 14, 1980  Admit date:     12/23/2023  Discharge date: 12/25/23  Discharge Physician: Toribio Door   PCP: [facility]   Recommendations at discharge:   Routine care for diabetes.  Continue to encourage abstinence from alcohol.  Discharge Diagnoses: Principal Problem:   DKA (diabetic ketoacidosis) (HCC) Active Problems:   Obesity (BMI 30-39.9)   Paroxysmal atrial fibrillation (HCC)   Type 1 diabetes mellitus without complications (HCC)   AKI (acute kidney injury)   Alcohol withdrawal (HCC)   Nausea vomiting and diarrhea  Resolved Problems:   * No resolved hospital problems. *  Hospital Course: 43 year old woman PMH including diabetes mellitus type 1 on insulin , peripheral neuropathy, obesity, B12 and folate deficiency, prolonged QT, presented with abdominal pain, nausea, vomiting, diarrhea.  Admitted for DKA.  Treated with standard therapy with rapid clinical improvement.  Hospitalization uncomplicated.  Consultants None   Procedures/Events 10/7 admit for DKA   DKA  Diabetes mellitus type 1 on insulin  Admission CO2 was less than 7, anion gap not calculated, with beta hydroxybutyric acid 2.15 DKA resolved.  Resume subcutaneous insulin , continue sliding scale.     Nausea, vomiting, diarrhea Probable gastroenteritis C. difficile screen indeterminate with positive antigen and negative toxin.  PCR negative.  History and exam argues against C. difficile.  Leukocytosis was probably stress reaction.  No further evaluation suggested.  Only had 3 stools yesterday and none overnight.   AKI -- resolved Baseline creatinine around 0.8. Peak creatinine 1.34. Near baseline. No further evaluation suggested.   Acute alcohol withdrawal On CIWA per EDP, continued by admitting physician.  EDP note not complete, not noted on H&P. This appears clinically resolved at this point.  Discussed with  patient, recommended abstinence from alcohol.   Leukocytosis secondary to acute illness Likely stress reaction   Paroxysmal atrial fibrillation Resume Multaq , metoprolol  and apixaban    Atypical chest pain secondary to vomiting EKG reassuring, sinus tach, no acute changes, troponins negative.  ACS ruled out.   Depression and anxiety Continue buspirone, desvenlafaxine Hold alprazolam while on CIWA   Class II obesity Body mass index is 38.82 kg/m.   Disposition: Home Diet recommendation:  Carb modified diet DISCHARGE MEDICATION: Allergies as of 12/25/2023       Reactions   Flecainide  Other (See Comments), Palpitations   Prolonged QTC   Penicillins Anaphylaxis        Medication List     STOP taking these medications    chlordiazePOXIDE 5 MG capsule Commonly known as: LIBRIUM   fenofibrate  micronized 200 MG capsule Commonly known as: LOFIBRA   Potassium 99 MG Tabs   potassium chloride  10 MEQ tablet Commonly known as: KLOR-CON        TAKE these medications    acetaminophen  500 MG tablet Commonly known as: TYLENOL  Take 500-1,000 mg by mouth every 6 (six) hours as needed (pain.).   Airsupra 90-80 MCG/ACT Aero Generic drug: Albuterol-Budesonide Inhale 2 puffs into the lungs as needed (SOB).   ALPRAZolam 0.25 MG tablet Commonly known as: XANAX Take 0.25 mg by mouth 2 (two) times daily as needed.   apixaban  5 MG Tabs tablet Commonly known as: ELIQUIS  Take 1 tablet (5 mg total) by mouth 2 (two) times daily. What changed: Another medication with the same name was removed. Continue taking this medication, and follow the directions you see here.   busPIRone 15 MG tablet Commonly known as: BUSPAR Take 15 mg by mouth 3 (three)  times daily.   EPINEPHrine  0.3 mg/0.3 mL Soaj injection Commonly known as: EPI-PEN Inject 0.3 mg into the muscle as needed for anaphylaxis.   ferrous sulfate  ER 142 (45 Fe) MG Tbcr tablet Commonly known as: Slow Fe Take 1 tablet  (142 mg total) by mouth daily.   gabapentin  600 MG tablet Commonly known as: NEURONTIN  Take 600 mg by mouth daily as needed (nerve pain).   hydrOXYzine 25 MG tablet Commonly known as: ATARAX Take 25 mg by mouth every 8 (eight) hours as needed.   insulin  NPH Human 100 UNIT/ML injection Commonly known as: NOVOLIN N Inject 10-22 Units into the skin 2 (two) times daily before a meal. Inject 20-22 units subcuteanously in the morning, and inject 10-14 units subcutaneoulsy in the evening.   insulin  regular 100 units/mL injection Commonly known as: NOVOLIN R Inject 8-22 Units into the skin 2 (two) times daily before a meal. Per patients sliding scale: breakfast 20-22 units, supper 8-12   magnesium  gluconate 500 (27 Mg) MG Tabs tablet Commonly known as: MAGONATE Take 1 tablet (500 mg total) by mouth daily.   metoprolol  tartrate 50 MG tablet Commonly known as: LOPRESSOR  TAKE 1 TABLET BY MOUTH TWICE A DAY   Multaq  400 MG tablet Generic drug: dronedarone  Take 1 tablet (400 mg total) by mouth 2 (two) times daily with a meal.   multivitamin with minerals tablet Take 1 tablet by mouth daily.   norethindrone  5 MG tablet Commonly known as: AYGESTIN  Take 2 tablets (10 mg total) by mouth daily.   Pristiq 25 MG 24 hr tablet Generic drug: desvenlafaxine Take 25 mg by mouth at bedtime.   SUMAtriptan  100 MG tablet Commonly known as: IMITREX  Take 100 mg by mouth every 2 (two) hours as needed for migraine.   vitamin B-12 500 MCG tablet Commonly known as: CYANOCOBALAMIN  Take 500 mcg by mouth daily.   Vitamin D (Ergocalciferol) 1.25 MG (50000 UNIT) Caps capsule Commonly known as: DRISDOL Take 50,000 Units by mouth See admin instructions. Patient takes this medication when she remembers to        Follow-up Information     [facility], [facility]. Schedule an appointment as soon as possible for a visit in 1 week(s).   Contact information: [address] [place]  [address] [phone]                Feels better  Discharge Exam: Filed Weights   12/23/23 0810 12/23/23 1557  Weight: 101.6 kg 99.4 kg   Physical Exam Vitals reviewed.  Constitutional:      General: She is not in acute distress.    Appearance: She is not ill-appearing or toxic-appearing.  Cardiovascular:     Rate and Rhythm: Normal rate and regular rhythm.     Heart sounds: No murmur heard. Pulmonary:     Effort: Pulmonary effort is normal. No respiratory distress.     Breath sounds: No wheezing, rhonchi or rales.  Neurological:     Mental Status: She is alert.  Psychiatric:        Mood and Affect: Mood normal.        Behavior: Behavior normal.      Condition at discharge: good  The results of significant diagnostics from this hospitalization (including imaging, microbiology, ancillary and laboratory) are listed below for reference.   Imaging Studies: DG Chest Portable 1 View Result Date: 12/23/2023 CLINICAL DATA:  Shortness of breath. EXAM: PORTABLE CHEST 1 VIEW COMPARISON:  12/14/2023. FINDINGS: The heart size  and mediastinal contours are unchanged. No focal consolidation, pleural effusion, or pneumothorax. No acute osseous abnormality. IMPRESSION: No acute cardiopulmonary findings. Electronically Signed   By: Harrietta Sherry M.D.   On: 12/23/2023 09:41   DG Chest 2 View Result Date: 12/14/2023 EXAM: 2 VIEW(S) XRAY OF THE CHEST 12/14/2023 10:51:53 AM COMPARISON: 03/24/2023 CLINICAL HISTORY: SHOB. Also reports intermittent tingling in L arm for 1 week. Intermittent shortness of breath w exertion and nausea for 1 week. FINDINGS: LUNGS AND PLEURA: No focal pulmonary opacity. No pulmonary edema. No pleural effusion. No pneumothorax. HEART AND MEDIASTINUM: No acute abnormality of the cardiac and mediastinal silhouettes. BONES AND SOFT TISSUES: Thoracic degenerative changes. No acute fracture. IMPRESSION: 1. Normal chest radiograph. No acute cardiopulmonary process.  Electronically signed by: Waddell Calk MD 12/14/2023 11:10 AM EDT RP Workstation: [id number]    Microbiology: Results for orders placed or performed during the hospital encounter of 12/23/23  MRSA Next Gen by PCR, Nasal     Status: None   Collection Time: 12/23/23  4:11 PM   Specimen: Nasal Mucosa; Nasal Swab  Result Value Ref Range Status   MRSA by PCR Next Gen NOT DETECTED NOT DETECTED Final    Comment: (NOTE) The GeneXpert MRSA Assay (FDA approved for NASAL specimens only), is one component of a comprehensive MRSA colonization surveillance program. It is not intended to diagnose MRSA infection nor to guide or monitor treatment for MRSA infections. Test performance is not FDA approved in patients less than 33 years old. Performed at [facility], [address] [place], [place] [address]   C Difficile Quick Screen w PCR reflex     Status: Abnormal   Collection Time: 12/23/23 11:27 PM   Specimen: STOOL  Result Value Ref Range Status   C Diff antigen POSITIVE (A) NEGATIVE Final   C Diff toxin NEGATIVE NEGATIVE Final   C Diff interpretation Results are indeterminate. See PCR results.  Final    Comment: Performed at [facility], [address] [place], [place] [address]  C. Diff by PCR, Reflexed     Status: None   Collection Time: 12/23/23 11:27 PM  Result Value Ref Range Status   Toxigenic C. Difficile by PCR NEGATIVE NEGATIVE Final    Comment: Patient is colonized with non toxigenic C. difficile. May not need treatment unless significant symptoms are present.   Hypervirulent Strain PRESUMPTIVE NEGATIVE PRESUMPTIVE NEGATIVE Final    Comment: Performed at [facility], [address] [place], [place] [address]    Labs: CBC: Recent Labs  Lab 12/23/23 [phone] 12/23/23 0908 12/24/23 0942 12/25/23 0304  WBC 22.5*  --  13.6* 13.9*  NEUTROABS 18.7*  --   --   --   HGB 11.2* 13.6 8.5* 9.5*  HCT 35.8* 40.0 28.3* 32.7*  MCV 96.0  --   100.4* 102.8*  PLT 352  --  135* 158   Basic Metabolic Panel: Recent Labs  Lab 12/23/23 1611 12/23/23 1702 12/23/23 2033 12/24/23 0122 12/25/23 0304  NA 136 135 135 137 136  K 5.4* 4.9 4.3 3.9 3.8  CL 99 102 100 101 100  CO2 18* 19* 21* 22 25  GLUCOSE 166* 140* 145* 164* 251*  BUN 9 8 8 7 8   CREATININE 1.09* 0.97 1.02* 0.98 0.95  CALCIUM 8.2* 8.0* 8.3* 8.2* 8.9   Liver Function Tests: Recent Labs  Lab 12/23/23 0857  AST 79*  ALT 40  ALKPHOS 137*  BILITOT 1.0  PROT 7.8  ALBUMIN 3.9   CBG: Recent Labs  Lab 12/24/23 0759 12/24/23 1215 12/24/23 1626 12/24/23 2130 12/25/23 0755  GLUCAP 298* 235* 223* 181* 249*    Discharge time spent: less than 30 minutes.  Signed: Toribio Door, MD [facility] 12/25/2023

## 2023-12-30 ENCOUNTER — Emergency Department (HOSPITAL_COMMUNITY)

## 2023-12-30 ENCOUNTER — Other Ambulatory Visit: Payer: Self-pay

## 2023-12-30 ENCOUNTER — Emergency Department (HOSPITAL_COMMUNITY)
Admission: EM | Admit: 2023-12-30 | Discharge: 2023-12-30 | Disposition: A | Discharge status: Home / Self Care | Attending: Emergency Medicine | Admitting: Emergency Medicine

## 2023-12-30 ENCOUNTER — Encounter (HOSPITAL_COMMUNITY): Payer: Self-pay

## 2023-12-30 DIAGNOSIS — I480 Paroxysmal atrial fibrillation: Secondary | ICD-10-CM | POA: Diagnosis not present

## 2023-12-30 DIAGNOSIS — Z87891 Personal history of nicotine dependence: Secondary | ICD-10-CM | POA: Diagnosis not present

## 2023-12-30 DIAGNOSIS — R079 Chest pain, unspecified: Secondary | ICD-10-CM | POA: Insufficient documentation

## 2023-12-30 DIAGNOSIS — Z794 Long term (current) use of insulin: Secondary | ICD-10-CM | POA: Diagnosis not present

## 2023-12-30 DIAGNOSIS — E109 Type 1 diabetes mellitus without complications: Secondary | ICD-10-CM | POA: Insufficient documentation

## 2023-12-30 DIAGNOSIS — R42 Dizziness and giddiness: Secondary | ICD-10-CM | POA: Diagnosis not present

## 2023-12-30 DIAGNOSIS — I4892 Unspecified atrial flutter: Secondary | ICD-10-CM | POA: Insufficient documentation

## 2023-12-30 DIAGNOSIS — R4182 Altered mental status, unspecified: Secondary | ICD-10-CM | POA: Insufficient documentation

## 2023-12-30 DIAGNOSIS — D6869 Other thrombophilia: Secondary | ICD-10-CM | POA: Insufficient documentation

## 2023-12-30 DIAGNOSIS — M79602 Pain in left arm: Secondary | ICD-10-CM | POA: Insufficient documentation

## 2023-12-30 DIAGNOSIS — R202 Paresthesia of skin: Secondary | ICD-10-CM | POA: Diagnosis not present

## 2023-12-30 DIAGNOSIS — Z7901 Long term (current) use of anticoagulants: Secondary | ICD-10-CM | POA: Diagnosis not present

## 2023-12-30 DIAGNOSIS — R0602 Shortness of breath: Secondary | ICD-10-CM | POA: Insufficient documentation

## 2023-12-30 DIAGNOSIS — I482 Chronic atrial fibrillation, unspecified: Secondary | ICD-10-CM | POA: Insufficient documentation

## 2023-12-30 LAB — DIFFERENTIAL
Basophils Absolute: 0.1 K/uL (ref 0.0–0.1)
Basophils Relative: 1 %
Eosinophils Absolute: 0.2 K/uL (ref 0.0–0.5)
Eosinophils Relative: 2 %
Lymphocytes Relative: 6 %
Lymphs Abs: 0.5 K/uL — ABNORMAL LOW (ref 0.7–4.0)
Monocytes Absolute: 0.7 K/uL (ref 0.1–1.0)
Monocytes Relative: 8 %
Neutro Abs: 7.3 K/uL (ref 1.7–7.7)
Neutrophils Relative %: 83 %

## 2023-12-30 LAB — COMPREHENSIVE METABOLIC PANEL WITH GFR
ALT: 31 U/L (ref 0–44)
AST: 51 U/L — ABNORMAL HIGH (ref 15–41)
Albumin: 2.8 g/dL — ABNORMAL LOW (ref 3.5–5.0)
Alkaline Phosphatase: 74 U/L (ref 38–126)
Anion gap: 15 (ref 5–15)
BUN: <5 mg/dL — ABNORMAL LOW (ref 6–20)
CO2: 22 mmol/L (ref 22–32)
Calcium: 8.1 mg/dL — ABNORMAL LOW (ref 8.9–10.3)
Chloride: 98 mmol/L (ref 98–111)
Creatinine, Ser: 1.38 mg/dL — ABNORMAL HIGH (ref 0.44–1.00)
GFR, Estimated: 49 mL/min — ABNORMAL LOW (ref >60)
Glucose, Bld: 108 mg/dL — ABNORMAL HIGH (ref 70–99)
Potassium: 3.2 mmol/L — ABNORMAL LOW (ref 3.5–5.1)
Sodium: 135 mmol/L (ref 135–145)
Total Bilirubin: 1.2 mg/dL (ref 0.0–1.2)
Total Protein: 6.9 g/dL (ref 6.5–8.1)

## 2023-12-30 LAB — CBC
HCT: 31.8 % — ABNORMAL LOW (ref 36.0–46.0)
Hemoglobin: 9.5 g/dL — ABNORMAL LOW (ref 12.0–15.0)
MCH: 31.1 pg (ref 26.0–34.0)
MCHC: 29.9 g/dL — ABNORMAL LOW (ref 30.0–36.0)
MCV: 104.3 fL — ABNORMAL HIGH (ref 80.0–100.0)
Platelets: 381 K/uL (ref 150–400)
RBC: 3.05 MIL/uL — ABNORMAL LOW (ref 3.87–5.11)
RDW: 21.3 % — ABNORMAL HIGH (ref 11.5–15.5)
WBC: 8.8 K/uL (ref 4.0–10.5)
nRBC: 0.2 % (ref 0.0–0.2)

## 2023-12-30 LAB — HCG, SERUM, QUALITATIVE: Preg, Serum: NEGATIVE

## 2023-12-30 LAB — I-STAT CHEM 8, ED
BUN: <3 mg/dL — ABNORMAL LOW (ref 6–20)
Calcium, Ion: 1 mmol/L — ABNORMAL LOW (ref 1.15–1.40)
Chloride: 98 mmol/L (ref 98–111)
Creatinine, Ser: 1.4 mg/dL — ABNORMAL HIGH (ref 0.44–1.00)
Glucose, Bld: 111 mg/dL — ABNORMAL HIGH (ref 70–99)
HCT: 33 % — ABNORMAL LOW (ref 36.0–46.0)
Hemoglobin: 11.2 g/dL — ABNORMAL LOW (ref 12.0–15.0)
Potassium: 3.2 mmol/L — ABNORMAL LOW (ref 3.5–5.1)
Sodium: 137 mmol/L (ref 135–145)
TCO2: 23 mmol/L (ref 22–32)

## 2023-12-30 LAB — PROTIME-INR
INR: 1.4 — ABNORMAL HIGH (ref 0.8–1.2)
Prothrombin Time: 17.9 s — ABNORMAL HIGH (ref 11.4–15.2)

## 2023-12-30 LAB — TROPONIN I (HIGH SENSITIVITY): Troponin I (High Sensitivity): 8 ng/L (ref <18)

## 2023-12-30 LAB — APTT: aPTT: 26 s (ref 24–36)

## 2023-12-30 LAB — CBG MONITORING, ED: Glucose-Capillary: 94 mg/dL (ref 70–99)

## 2023-12-30 LAB — ETHANOL: Alcohol, Ethyl (B): <15 mg/dL (ref <15)

## 2023-12-30 MED ORDER — ACETAMINOPHEN 500 MG PO TABS
1000.0000 mg | ORAL_TABLET | Freq: Once | ORAL | Status: AC
  Administered 2023-12-30: 1000 mg via ORAL
  Filled 2023-12-30: qty 2

## 2023-12-30 MED ORDER — SODIUM CHLORIDE 0.9% FLUSH: 3.0000 mL | Freq: Once | INTRAVENOUS | Status: DC

## 2023-12-30 MED ORDER — SODIUM CHLORIDE 0.9 % IV BOLUS
1000.0000 mL | Freq: Once | INTRAVENOUS | Status: AC
  Administered 2023-12-30: 1000 mL via INTRAVENOUS

## 2023-12-30 MED ORDER — POTASSIUM CHLORIDE CRYS ER 20 MEQ PO TBCR
40.0000 meq | EXTENDED_RELEASE_TABLET | Freq: Once | ORAL | Status: AC
  Administered 2023-12-30: 40 meq via ORAL
  Filled 2023-12-30: qty 2

## 2023-12-30 NOTE — ED Provider Notes (Signed)
 [facility] Provider Note  CSN: [id number] Arrival date & time: 12/30/23 1002  Chief Complaint(s) Dizziness, left arm pain, Altered Mental Status, and Shortness of Breath  HPI Katie Hampton is a 43 y.o. female history of atrial fibrillation on Eliquis ,, type 1 diabetes, obesity presenting to the emergency department with left arm pain.  Patient reports left arm pain started today.  Initially when it began she also had some chest pain and shortness of breath.  Describes the pain as a tingling sensation.  Denies any numbness.  Denies any swelling to the arm.  No fevers or chills.  No repetitive activity.  No falls or injuries.  Reports the pain is in the whole arm.  Also reported feeling some lightheadedness.  Symptoms have improved other than some persistent tingling sensation in the left arm.  No cough or other new symptoms.  Was recently hospitalized with symptoms were not present during hospitalization.   Past Medical History Past Medical History:  Diagnosis Date   AF (paroxysmal atrial fibrillation) (HCC)    Diabetes mellitus    Type 1   Pyelonephritis    Patient Active Problem List   Diagnosis Date Noted   AKI (acute kidney injury) 12/24/2023   Alcohol withdrawal (HCC) 12/24/2023   Nausea vomiting and diarrhea 12/24/2023   DKA (diabetic ketoacidosis) (HCC) 12/23/2023   Symptomatic anemia 03/02/2023   Menometrorrhagia 03/02/2023   B12 deficiency 03/02/2023   Folate deficiency 03/02/2023   Hypokalemia 03/02/2023   Prolonged QT interval 03/02/2023   Secondary hypercoagulable state 10/11/2022   Type 1 diabetes mellitus without complications (HCC) 07/30/2022   Acute pyelonephritis 05/30/2022   Abscess of right kidney 05/24/2022   Peripheral neuropathy 05/24/2022   Paroxysmal atrial fibrillation (HCC) 05/24/2022   HLD (hyperlipidemia) 05/24/2022   Obesity (BMI 30-39.9) 05/24/2022   Home Medication(s) Prior to Admission  medications   Medication Sig Start Date End Date Taking? Authorizing Provider  acetaminophen  (TYLENOL ) 500 MG tablet Take 500-1,000 mg by mouth every 6 (six) hours as needed (pain.).    [provider]  AIRSUPRA 90-80 MCG/ACT AERO Inhale 2 puffs into the lungs as needed (SOB). 07/11/23   [provider]  ALPRAZolam (XANAX) 0.25 MG tablet Take 0.25 mg by mouth 2 (two) times daily as needed. 12/10/23   [provider]  apixaban  (ELIQUIS ) 5 MG TABS tablet Take 1 tablet (5 mg total) by mouth 2 (two) times daily. 06/05/23   Riddle, Suzann, NP  busPIRone (BUSPAR) 15 MG tablet Take 15 mg by mouth 3 (three) times daily.    [provider]  desvenlafaxine (PRISTIQ) 25 MG 24 hr tablet Take 25 mg by mouth at bedtime.    [provider]  dronedarone  (MULTAQ ) 400 MG tablet Take 1 tablet (400 mg total) by mouth 2 (two) times daily with a meal. 11/27/23   Cindie Ole DASEN, MD  EPINEPHrine  0.3 mg/0.3 mL IJ SOAJ injection Inject 0.3 mg into the muscle as needed for anaphylaxis. 10/06/23   Cottie Donnice PARAS, MD  ferrous sulfate  ER (SLOW FE) 142 (45 Fe) MG TBCR tablet Take 1 tablet (142 mg total) by mouth daily. Patient not taking: Reported on 12/23/2023 03/03/23   Lue Elsie BROCKS, MD  gabapentin  (NEURONTIN ) 600 MG tablet Take 600 mg by mouth daily as needed (nerve pain).    [provider]  hydrOXYzine (ATARAX) 25 MG tablet Take 25 mg by mouth every 8 (eight) hours as needed. 11/28/23   [provider]  insulin  NPH (HUMULIN  N,NOVOLIN N) 100 UNIT/ML injection Inject 10-22 Units into the skin 2 (two) times daily before a meal. Inject 20-22 units subcuteanously in the morning, and inject 10-14 units subcutaneoulsy in the evening.    [provider]  insulin  regular (NOVOLIN R,HUMULIN  R) 100 units/mL injection Inject 8-22 Units into the skin 2 (two) times daily before a meal. Per patients sliding scale: breakfast 20-22 units, supper 8-12    [provider]  Magnesium  Gluconate 500 (27 Mg) MG TABS Take 1 tablet (500 mg total) by mouth daily. 03/14/23   Armenta Canning, MD  metoprolol  tartrate (LOPRESSOR ) 50 MG tablet TAKE 1 TABLET BY MOUTH TWICE A DAY 05/08/23   Cindie Ole DASEN, MD  Multiple Vitamins-Minerals (MULTIVITAMIN WITH MINERALS) tablet Take 1 tablet by mouth daily. 03/03/23 03/02/24  Lue Elsie BROCKS, MD  norethindrone  (AYGESTIN ) 5 MG tablet Take 2 tablets (10 mg total) by mouth daily. 04/13/23 12/23/23  Levander Slate, MD  SUMAtriptan  (IMITREX ) 100 MG tablet Take 100 mg by mouth every 2 (two) hours as needed for migraine. 05/03/22   [provider]  vitamin B-12 (CYANOCOBALAMIN ) 500 MCG tablet Take 500 mcg by mouth daily.    [provider]  Vitamin D, Ergocalciferol, (DRISDOL) 1.25 MG (50000 UNIT) CAPS capsule Take 50,000 Units by mouth See admin instructions. Patient takes this medication when she remembers to 05/03/22   [provider]                                                                                                                                    Past Surgical History Past Surgical History:  Procedure Laterality Date   CARDIOVERSION N/A 09/30/2022   Procedure: CARDIOVERSION;  Surgeon: Darron Deatrice LABOR, MD;  Location: [facility] ORS;  Service: Cardiovascular;  Laterality: N/A;   CARDIOVERSION N/A 10/07/2022   Procedure: CARDIOVERSION;  Surgeon: Inocencio Soyla Lunger, MD;  Location: [facility];  Service: Cardiovascular;  Laterality: N/A;   CHOLECYSTECTOMY     EYE SURGERY Bilateral    TUBAL LIGATION Bilateral    Family History Family History  Problem Relation Age of Onset   Kidney disease Sister    Colon cancer Maternal Aunt     Social History Social History   Tobacco Use   Smoking status: Former    Types: Cigarettes   Smokeless tobacco: Never  Vaping Use   Vaping status: Never Used  Substance Use Topics   Alcohol use: Yes    Comment: occasional   Drug use: Never    Allergies Flecainide  and Penicillins  Review of Systems Review of Systems  All other systems reviewed and are negative.   Physical Exam Vital Signs  I have reviewed the triage vital signs BP 135/85 (BP Location: Right Arm)   Pulse 79   Temp 99.3 F (37.4 C) (Oral)   Resp 20   Ht 5' 3 (1.6 m)   Wt  99.4 kg   LMP 12/10/2023 (Exact Date)   SpO2 99%   BMI 38.82 kg/m  Physical Exam Vitals and nursing note reviewed.  Constitutional:      General: She is not in acute distress.    Appearance: She is well-developed. She is obese.  HENT:     Head: Normocephalic and atraumatic.     Mouth/Throat:     Mouth: Mucous membranes are moist.  Eyes:     Pupils: Pupils are equal, round, and reactive to light.  Cardiovascular:     Rate and Rhythm: Normal rate and regular rhythm.     Heart sounds: No murmur heard. Pulmonary:     Effort: Pulmonary effort is normal. No respiratory distress.     Breath sounds: Normal breath sounds.  Abdominal:     General: Abdomen is flat.     Palpations: Abdomen is soft.     Tenderness: There is no abdominal tenderness.  Musculoskeletal:        General: No tenderness.     Right lower leg: No edema.     Left lower leg: No edema.     Comments: Bilateral upper extremities with normal distal 2+ radial pulse, no swelling or edema, few scattered bruises from IV start/blood draw from recent hospitalization but no palpable cord or other finding, range of motion at all joints without pain or difficulty  Skin:    General: Skin is warm and dry.  Neurological:     General: No focal deficit present.     Mental Status: She is alert. Mental status is at baseline.     Comments: Cranial nerves II through XII intact, strength 5 out of 5 in the bilateral upper and lower extremities, no sensory deficit to light touch, no dysmetria on finger-nose-finger testing  Psychiatric:        Mood and Affect: Mood normal.        Behavior: Behavior normal.     ED Results and  Treatments Labs (all labs ordered are listed, but only abnormal results are displayed) Labs Reviewed  PROTIME-INR - Abnormal; Notable for the following components:      Result Value   Prothrombin Time 17.9 (*)    INR 1.4 (*)    All other components within normal limits  CBC - Abnormal; Notable for the following components:   RBC 3.05 (*)    Hemoglobin 9.5 (*)    HCT 31.8 (*)    MCV 104.3 (*)    MCHC 29.9 (*)    RDW 21.3 (*)    All other components within normal limits  DIFFERENTIAL - Abnormal; Notable for the following components:   Lymphs Abs 0.5 (*)    All other components within normal limits  COMPREHENSIVE METABOLIC PANEL WITH GFR - Abnormal; Notable for the following components:   Potassium 3.2 (*)    Glucose, Bld 108 (*)    BUN <5 (*)    Creatinine, Ser 1.38 (*)    Calcium 8.1 (*)    Albumin 2.8 (*)    AST 51 (*)    GFR, Estimated 49 (*)    All other components within normal limits  I-STAT CHEM 8, ED - Abnormal; Notable for the following components:   Potassium 3.2 (*)    BUN <3 (*)    Creatinine, Ser 1.40 (*)    Glucose, Bld 111 (*)    Calcium, Ion 1.00 (*)    Hemoglobin 11.2 (*)    HCT 33.0 (*)    All other  components within normal limits  APTT  ETHANOL  HCG, SERUM, QUALITATIVE  CBG MONITORING, ED  TROPONIN I (HIGH SENSITIVITY)                                                                                                                          Radiology DG Chest 1 View Result Date: 12/30/2023 EXAM: 1 VIEW(S) XRAY OF THE CHEST 12/30/2023 11:14:00 AM COMPARISON: 12/23/2023 CLINICAL HISTORY: sob. Pt having lt arm tingling on/off for 1 month, worse today - having dizziness and SOB for a few days now as well - hx of AFIB, diabetes FINDINGS: LUNGS AND PLEURA: Low lung volume. Linear scarring/atelectasis at left lung base. No focal pulmonary opacity. No pulmonary edema. No pleural effusion. No pneumothorax. HEART AND MEDIASTINUM: No acute abnormality of the  cardiac and mediastinal silhouettes. BONES AND SOFT TISSUES: No acute osseous abnormality. IMPRESSION: 1. Low lung volume with linear scarring/atelectasis at left lung base. Electronically signed by: Donnice Mania MD 12/30/2023 12:11 PM EDT RP Workstation: [id number]   CT HEAD WO CONTRAST Result Date: 12/30/2023 EXAM: CT HEAD WITHOUT CONTRAST 12/30/2023 11:00:20 AM TECHNIQUE: CT of the head was performed without the administration of intravenous contrast. Automated exposure control, iterative reconstruction, and/or weight based adjustment of the mA/kV was utilized to reduce the radiation dose to as low as reasonably achievable. COMPARISON: MRI head 08/29/2023 CLINICAL HISTORY: Confusion, dizziness, left arm pain/tingling, and shortness of breath, starting last night. FINDINGS: BRAIN AND VENTRICLES: No acute hemorrhage, evidence of acute infarct, hydrocephalus, extra-axial collection, mass effect, or midline shift. Atherosclerotic calcification at the skull base. No hyperdense vessel. ORBITS: Postoperative changes to the left globe. SINUSES: Minimal mucosal thickening in the maxillary sinuses. Clear mastoid air cells. SOFT TISSUES AND SKULL: No acute soft tissue abnormality. No skull fracture. IMPRESSION: 1. No acute intracranial abnormality. Electronically signed by: Dasie Hamburg MD 12/30/2023 11:28 AM EDT RP Workstation: [id number]    Pertinent labs & imaging results that were available during my care of the patient were reviewed by me and considered in my medical decision making (see MDM for details).  Medications Ordered in ED Medications  sodium chloride  flush (NS) 0.9 % injection 3 mL (3 mLs Intravenous Not Given 12/30/23 1159)  potassium chloride  SA (KLOR-CON  M) CR tablet 40 mEq (has no administration in time range)  sodium chloride  0.9 % bolus 1,000 mL (1,000 mLs Intravenous New Bag/Given 12/30/23 1220)  acetaminophen  (TYLENOL ) tablet 1,000 mg (1,000 mg Oral Given 12/30/23 1222)  Procedures Procedures  (including critical care time)  Medical Decision Making / ED Course   MDM:  43 year old presenting to the emergency department with left arm pain.  Patient overall well-appearing, physical exam with normal left upper extremity, normal strength and sensation, normal circulation, no swelling, no deformity or injury.  Remainder of examination is also reassuring.  Considered DVT, however patient is on Eliquis , considered arterial occlusion but normal pulses and peripheral circulation.  No motor or sensory deficits to suggest acute stroke.  No decreased range of motion to suggest arthropathy or infection, no fall or injury to suggest fracture.  Could be possible neuropathic pain from neck or sleep palsy but no objective deficit or weakness on exam.  Patient earlier did report some chest pain and shortness of breath.  EKG shows atrial fibrillation/flutter without other change.  Considered ACS but troponin negative.  Has had recent negative stress test in the past 1.5 years.  Low concern for this with negative troponin recent reassuring stress test.  Also considered pulmonary embolism however patient on chronic anticoagulation with Eliquis  so low concern for this.  Chest x-ray without evidence of acute process.  Lungs are clear on exam.  She has atelectasis but doubt other process such as pneumonia.  Given reassuring workup, reassuring physical exam, feel patient is stable for discharge although do not know the exact cause of her symptoms today.  Recommended supportive care and close follow-up with PMD.  Discussed tricked return precautions. Will discharge patient to home. All questions answered. Patient comfortable with plan of discharge. Return precautions discussed with patient and specified on the after visit summary.       Additional history  obtained: -Additional history obtained from spouse -External records from outside source obtained and reviewed including: Chart review including previous notes, labs, imaging, consultation notes including recent hospitalization   Lab Tests: -I ordered, reviewed, and interpreted labs.   The pertinent results include:   Labs Reviewed  PROTIME-INR - Abnormal; Notable for the following components:      Result Value   Prothrombin Time 17.9 (*)    INR 1.4 (*)    All other components within normal limits  CBC - Abnormal; Notable for the following components:   RBC 3.05 (*)    Hemoglobin 9.5 (*)    HCT 31.8 (*)    MCV 104.3 (*)    MCHC 29.9 (*)    RDW 21.3 (*)    All other components within normal limits  DIFFERENTIAL - Abnormal; Notable for the following components:   Lymphs Abs 0.5 (*)    All other components within normal limits  COMPREHENSIVE METABOLIC PANEL WITH GFR - Abnormal; Notable for the following components:   Potassium 3.2 (*)    Glucose, Bld 108 (*)    BUN <5 (*)    Creatinine, Ser 1.38 (*)    Calcium 8.1 (*)    Albumin 2.8 (*)    AST 51 (*)    GFR, Estimated 49 (*)    All other components within normal limits  I-STAT CHEM 8, ED - Abnormal; Notable for the following components:   Potassium 3.2 (*)    BUN <3 (*)    Creatinine, Ser 1.40 (*)    Glucose, Bld 111 (*)    Calcium, Ion 1.00 (*)    Hemoglobin 11.2 (*)    HCT 33.0 (*)    All other components within normal limits  APTT  ETHANOL  HCG, SERUM, QUALITATIVE  CBG MONITORING, ED  TROPONIN I (  HIGH SENSITIVITY)    Notable for mild anemia, hypokalemia   EKG   EKG Interpretation Date/Time:  Tuesday December 30 2023 10:08:57 EDT Ventricular Rate:  71 PR Interval:    QRS Duration:  80 QT Interval:  398 QTC Calculation: 432 R Axis:   29  Text Interpretation: Atrial flutter with variable A-V block Low voltage QRS Age indeterminate anterior infarct Compared with prior EKG from 12/23/2023 Confirmed by Gennaro Bouchard ([id number]) on 12/30/2023 10:31:12 AM         Imaging Studies ordered: I ordered imaging studies including CXR, CT hea d On my interpretation imaging demonstrates no acute process I independently visualized and interpreted imaging. I agree with the radiologist interpretation   Medicines ordered and prescription drug management: Meds ordered this encounter  Medications   sodium chloride  flush (NS) 0.9 % injection 3 mL   sodium chloride  0.9 % bolus 1,000 mL   acetaminophen  (TYLENOL ) tablet 1,000 mg   potassium chloride  SA (KLOR-CON  M) CR tablet 40 mEq    -I have reviewed the patients home medicines and have made adjustments as needed   Social Determinants of Health:  Diagnosis or treatment significantly limited by social determinants of health: obesity   Reevaluation: After the interventions noted above, I reevaluated the patient and found that their symptoms have improved  Co morbidities that complicate the patient evaluation  Past Medical History:  Diagnosis Date   AF (paroxysmal atrial fibrillation) (HCC)    Diabetes mellitus    Type 1   Pyelonephritis       Dispostion: Disposition decision including need for hospitalization was considered, and patient discharged from emergency department.    Final Clinical Impression(s) / ED Diagnoses Final diagnoses:  Left arm pain     This chart was dictated using voice recognition software.  Despite best efforts to proofread,  errors can occur which can change the documentation meaning.    Francesca Elsie CROME, MD 12/30/23 [phone]

## 2023-12-30 NOTE — ED Triage Notes (Addendum)
 Pt. Stated,  Ive been SOB, Confused, dizzy, and left arm pain with tingling. This all started last night, and I couldn't sleep cause my left arm was hurting all night. I woke up also and thought I was in the hospital.  Pt. Alert and oriented x 4

## 2023-12-30 NOTE — ED Notes (Signed)
 CCMD called and notified

## 2023-12-30 NOTE — Discharge Instructions (Addendum)
 We evaluated you for your left arm pain.  We do not know the exact cause of your pain, but your physical examination and testing in the emergency department was reassuring.  We did not see any signs of a stroke or heart attack, and your laboratory testing was overall reassuring besides slight dehydration.  We do not think that you have a blood clot or other injury like a broken bone or infection.  Take 1000 mg of Tylenol  every 6 hours as needed for your symptoms at home.  Please also keep your arm elevated and ice any painful area.  If you do develop any new or worsening symptoms, such as recurrent chest pain or difficulty breathing, severe headaches, inability to move your arm or move your leg, inability to feel your arm, trouble using your arm or clumsiness, swelling to your arm or color change to your arm, fevers, please return to the emergency department for reassessment.

## 2023-12-31 ENCOUNTER — Other Ambulatory Visit (HOSPITAL_BASED_OUTPATIENT_CLINIC_OR_DEPARTMENT_OTHER): Payer: Self-pay | Admitting: Nurse Practitioner

## 2023-12-31 DIAGNOSIS — R0602 Shortness of breath: Secondary | ICD-10-CM

## 2024-01-05 ENCOUNTER — Encounter (HOSPITAL_BASED_OUTPATIENT_CLINIC_OR_DEPARTMENT_OTHER): Payer: Self-pay

## 2024-01-05 ENCOUNTER — Ambulatory Visit (HOSPITAL_BASED_OUTPATIENT_CLINIC_OR_DEPARTMENT_OTHER)
Admission: RE | Admit: 2024-01-05 | Discharge: 2024-01-05 | Disposition: A | Discharge status: Home / Self Care | Source: Ambulatory Visit | Attending: Nurse Practitioner | Admitting: Nurse Practitioner

## 2024-01-05 DIAGNOSIS — R0602 Shortness of breath: Secondary | ICD-10-CM | POA: Insufficient documentation

## 2024-01-05 MED ORDER — IOHEXOL 350 MG/ML SOLN
100.0000 mL | Freq: Once | INTRAVENOUS | Status: AC | PRN
Start: 2024-01-05 — End: 2024-01-05
  Administered 2024-01-05: 57 mL via INTRAVENOUS

## 2024-01-06 ENCOUNTER — Encounter (HOSPITAL_COMMUNITY): Payer: Self-pay | Admitting: Nurse Practitioner

## 2024-01-06 DIAGNOSIS — R0602 Shortness of breath: Secondary | ICD-10-CM

## 2024-01-13 ENCOUNTER — Other Ambulatory Visit: Payer: Self-pay | Admitting: Cardiology

## 2024-01-14 NOTE — Telephone Encounter (Signed)
 Pt is calling to check on refill because she has 1 pill left

## 2024-01-18 ENCOUNTER — Other Ambulatory Visit: Payer: Self-pay | Admitting: Cardiology

## 2024-01-19 ENCOUNTER — Other Ambulatory Visit: Payer: Self-pay

## 2024-01-19 MED ORDER — APIXABAN 5 MG PO TABS: 5.0000 mg | ORAL_TABLET | Freq: Two times a day (BID) | ORAL | 0 refills | Status: DC

## 2024-02-06 NOTE — Progress Notes (Unsigned)
 This encounter was created in error - please disregard.

## 2024-02-09 ENCOUNTER — Ambulatory Visit: Attending: Cardiology | Admitting: Cardiology

## 2024-02-09 DIAGNOSIS — I483 Typical atrial flutter: Secondary | ICD-10-CM

## 2024-02-09 DIAGNOSIS — I4819 Other persistent atrial fibrillation: Secondary | ICD-10-CM

## 2024-02-09 DIAGNOSIS — D6869 Other thrombophilia: Secondary | ICD-10-CM

## 2024-02-09 DIAGNOSIS — Z79899 Other long term (current) drug therapy: Secondary | ICD-10-CM

## 2024-04-21 ENCOUNTER — Telehealth: Payer: Self-pay | Admitting: Cardiology

## 2024-04-21 MED ORDER — APIXABAN 5 MG PO TABS: 5.0000 mg | ORAL_TABLET | Freq: Two times a day (BID) | ORAL | 0 refills | Status: AC

## 2024-04-21 NOTE — Telephone Encounter (Signed)
" °*  STAT* If patient is at the pharmacy, call can be transferred to refill team.   1. Which medications need to be refilled? (please list name of each medication and dose if known) dronedarone  (MULTAQ ) 400 MG tablet    apixaban  (ELIQUIS ) 5 MG TABS tablet    4. Which pharmacy/location (including street and city if local pharmacy) is medication to be sent to?  [facility] -  ([place]), [place] - [address]     5. Do they need a 30 day or 90 day supply? 90   Pt scheduled [birth year] - she is completely out  "

## 2024-04-21 NOTE — Telephone Encounter (Signed)
 Returned call to pt.  Left a message that we will send in 30 day refill of Eliquis  (apt 05/19/24), but the Multaq  should have  1 refill left at [facility].  Pt asked to call back if needed further assistance.   Prescription refill request for Eliquis  received. Indication: AFIB Last office visit: 06/2023 Scr: 1.38 (10/25) Age: 44 Weight: 99.4KG

## 2024-05-19 ENCOUNTER — Ambulatory Visit: Admitting: Cardiology
